# Patient Record
Sex: Male | Born: 1990 | Race: Black or African American | Hispanic: No | Marital: Single | Smoking: Current some day smoker
Health system: Southern US, Community
[De-identification: ages and names within clinical notes are randomized; demographics above are authoritative.]

## PROBLEM LIST (undated history)

## (undated) DIAGNOSIS — F319 Bipolar disorder, unspecified: Principal | ICD-10-CM

## (undated) DIAGNOSIS — F32A Depression, unspecified: Secondary | ICD-10-CM

## (undated) DIAGNOSIS — N433 Hydrocele, unspecified: Secondary | ICD-10-CM

## (undated) HISTORY — DX: Depression, unspecified: F32.A

## (undated) HISTORY — DX: Hydrocele, unspecified: N43.3

---
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
## 2011-03-19 ENCOUNTER — Ambulatory Visit: Payer: Self-pay | Admitting: Family Medicine

## 2011-12-21 ENCOUNTER — Telehealth: Payer: Self-pay | Admitting: Family Medicine

## 2011-12-21 ENCOUNTER — Ambulatory Visit (INDEPENDENT_AMBULATORY_CARE_PROVIDER_SITE_OTHER): Payer: Self-pay | Admitting: Family Medicine

## 2011-12-21 VITALS — BP 128/64 | HR 86 | Temp 100.2°F | Ht 71.0 in | Wt 163.5 lb

## 2011-12-21 DIAGNOSIS — J029 Acute pharyngitis, unspecified: Secondary | ICD-10-CM

## 2011-12-21 MED ORDER — AMOXICILLIN 500 MG PO CAPS: 500.0000 mg | ORAL_CAPSULE | Freq: Two times a day (BID) | ORAL | Status: DC

## 2011-12-21 NOTE — Patient Instructions (Addendum)
Take the Amoxicillin for the next 7 days. Let me know if you're not getting better later this week.

## 2011-12-22 DIAGNOSIS — J029 Acute pharyngitis, unspecified: Secondary | ICD-10-CM | POA: Insufficient documentation

## 2011-12-22 NOTE — Progress Notes (Signed)
  Subjective:    Patient ID: Jerry Johnson, male    DOB: [birth year], 21 y.o.   MRN: [id number]  HPI  1. Sore throat:  Present x 1 week.  Increasing pain.  Feels "glands are swollen."  Worse when swallows, eats, talks.  Subjective fevers but none measured.  No other URI symptoms, no cough.  Some chills at night.  No nausea or vomiting.  Continuing to eat and drink well.   Review of Systems No weight loss, chest pain, night sweats,.      Objective:   Physical Exam Gen:  Alert, cooperative patient who appears stated age in no acute distress.  Vital signs reviewed. Head:  [place]/AT Eyes:  EOMI, PERRL.  Sclera and conjunctiva non-erythematous and non-icteric. Nose:  Nares patent Ears:  Canals clear BL.  TM's pearly gray BL without effusion or retraction Mouth:  MMM.  Tonsils +3 BL with multiple exudates noted.  No thrush in mouth, none on posterior oropharynx.   Neck:  No lymphadenopathy noted Cardiac:  Regular rate and rhythm without murmur auscultated.  Good S1/S2. Pulm:  Clear to auscultation bilaterally with good air movement.  No wheezes or rales noted.           Assessment & Plan:

## 2011-12-22 NOTE — Assessment & Plan Note (Signed)
Strep test negative.  Plan to treat based on length of symptoms and fact he had noticeable exudates on examination.   No thrush, much less likely oral candidiasis.  If no improvement with Amoxicillin patient is to call or return in next 4-5 days, may need Nystatin and HIV test at that time.  No red flags however.

## 2011-12-23 NOTE — Telephone Encounter (Signed)
error 

## 2012-05-27 ENCOUNTER — Encounter: Payer: Self-pay | Admitting: Family Medicine

## 2012-05-27 ENCOUNTER — Ambulatory Visit (INDEPENDENT_AMBULATORY_CARE_PROVIDER_SITE_OTHER): Payer: Medicaid Other | Admitting: Family Medicine

## 2012-05-27 VITALS — BP 124/73 | HR 94 | Temp 98.1°F | Ht 69.0 in | Wt 162.5 lb

## 2012-05-27 DIAGNOSIS — R1031 Right lower quadrant pain: Secondary | ICD-10-CM

## 2012-05-27 HISTORY — DX: Right lower quadrant pain: R10.31

## 2012-05-27 NOTE — Assessment & Plan Note (Signed)
A: R groin pain that has completely resolved. Given his history of hernia he is at risk of recurrent hernia. Normal exam today. P: Reassurance Reviewed red flags to seek medical attention -see AVS F/u prn recurrent pain or new mass.

## 2012-05-27 NOTE — Progress Notes (Signed)
Subjective:     Patient ID: Jerry Johnson, male   DOB: 10/21/1990, 21 y.o.   MRN: [id number]  HPI 22 yo M with a hx significant for R inguinal hernia and hydrocele repair  with mesh placement 4 years ago (brief OP note in EPIC) presents for same day visit with R groin pain that started acutely last week. Pain with started acutely at work. Non-radiating. Sharp/achy. No testicular pain. No fever, nausea, vomiting, diarrhea or constipation.   He has no pain today.   Review of Systems As per HPI     Objective:   Physical Exam BP 124/73  Pulse 94  Temp(Src) 98.1 F (36.7 C) (Oral)  Ht 5\' 9"  (1.753 m)  Wt 162 lb 8 oz (73.71 kg)  BMI 23.99 kg/m2 General appearance: alert, cooperative and no distress Abdomen: soft, non-tender; bowel sounds normal; no masses,  no organomegaly, no umbilical hernia. R inguinal scar w/o tenderness or mass. No L inguinal mass. No testicular pain or mass.     Assessment and Plan:

## 2012-05-27 NOTE — Patient Instructions (Addendum)
Gared,  Thank you for coming in to see me today.  I suspect that your pain was associated with a small hernia or muscle strain.  Since your symptoms resolved on their own and your exam is normal today you do not need further intervention at this time. However, this pain can come back spontaneously.   The major concern with a hernia is that a small one can become incarcerated, stuck with a loop of bowel.  If you develop a painless bulge please come back for recheck and referral to the surgeon. If you develop a painful bulge please seek immediate medical attention, especially if it is associated with nausea, vomiting or fever.   Dr. Armen Pickup

## 2012-11-28 ENCOUNTER — Encounter (HOSPITAL_COMMUNITY): Payer: Self-pay | Admitting: Emergency Medicine

## 2012-11-28 DIAGNOSIS — N390 Urinary tract infection, site not specified: Secondary | ICD-10-CM | POA: Insufficient documentation

## 2012-11-28 DIAGNOSIS — Z87448 Personal history of other diseases of urinary system: Secondary | ICD-10-CM | POA: Insufficient documentation

## 2012-11-28 DIAGNOSIS — F17200 Nicotine dependence, unspecified, uncomplicated: Secondary | ICD-10-CM | POA: Insufficient documentation

## 2012-11-28 LAB — COMPREHENSIVE METABOLIC PANEL
ALT: 14 U/L (ref 0–53)
AST: 31 U/L (ref 0–37)
Albumin: 4.4 g/dL (ref 3.5–5.2)
Alkaline Phosphatase: 68 U/L (ref 39–117)
BUN: 11 mg/dL (ref 6–23)
CO2: 28 mEq/L (ref 19–32)
Calcium: 9.8 mg/dL (ref 8.4–10.5)
Chloride: 103 mEq/L (ref 96–112)
Creatinine, Ser: 0.74 mg/dL (ref 0.50–1.35)
GFR calc Af Amer: >90 mL/min (ref >90)
GFR calc non Af Amer: >90 mL/min (ref >90)
Glucose, Bld: 87 mg/dL (ref 70–99)
Potassium: 3.8 mEq/L (ref 3.5–5.1)
Sodium: 140 mEq/L (ref 135–145)
Total Bilirubin: 0.7 mg/dL (ref 0.3–1.2)
Total Protein: 7.8 g/dL (ref 6.0–8.3)

## 2012-11-28 LAB — CBC WITH DIFFERENTIAL/PLATELET
Basophils Absolute: 0 10*3/uL (ref 0.0–0.1)
Basophils Relative: 0 % (ref 0–1)
Eosinophils Absolute: 0.2 10*3/uL (ref 0.0–0.7)
Eosinophils Relative: 2 % (ref 0–5)
HCT: 37.7 % — ABNORMAL LOW (ref 39.0–52.0)
Hemoglobin: 12.6 g/dL — ABNORMAL LOW (ref 13.0–17.0)
Lymphocytes Relative: 19 % (ref 12–46)
Lymphs Abs: 1.8 10*3/uL (ref 0.7–4.0)
MCH: 25.3 pg — ABNORMAL LOW (ref 26.0–34.0)
MCHC: 33.4 g/dL (ref 30.0–36.0)
MCV: 75.6 fL — ABNORMAL LOW (ref 78.0–100.0)
Monocytes Absolute: 0.4 10*3/uL (ref 0.1–1.0)
Monocytes Relative: 5 % (ref 3–12)
Neutro Abs: 6.7 10*3/uL (ref 1.7–7.7)
Neutrophils Relative %: 74 % (ref 43–77)
Platelets: 176 10*3/uL (ref 150–400)
RBC: 4.99 MIL/uL (ref 4.22–5.81)
RDW: 14.8 % (ref 11.5–15.5)
WBC: 9.1 10*3/uL (ref 4.0–10.5)

## 2012-11-28 LAB — URINALYSIS, ROUTINE W REFLEX MICROSCOPIC
Glucose, UA: NEGATIVE mg/dL
Nitrite: NEGATIVE
pH: 7 (ref 5.0–8.0)

## 2012-11-28 LAB — URINE MICROSCOPIC-ADD ON

## 2012-11-28 NOTE — ED Notes (Signed)
PT. REPORTS EMESIS AND ABDOMINAL CRAMPING AFTER EATING DINNER LAST NIGHT .

## 2012-11-28 NOTE — ED Notes (Signed)
Pt provided urine specimen by clean catch. Pt was not in and out cath. Pt provided urine specimen with out any complaints.

## 2012-11-29 ENCOUNTER — Emergency Department (HOSPITAL_COMMUNITY)
Admission: EM | Admit: 2012-11-29 | Discharge: 2012-11-29 | Disposition: A | Discharge status: Home / Self Care | Payer: Medicaid Other | Attending: Emergency Medicine | Admitting: Emergency Medicine

## 2012-11-29 DIAGNOSIS — N390 Urinary tract infection, site not specified: Secondary | ICD-10-CM

## 2012-11-29 MED ORDER — GI COCKTAIL ~~LOC~~
30.0000 mL | Freq: Once | ORAL | Status: AC
  Administered 2012-11-29: 30 mL via ORAL
  Filled 2012-11-29: qty 30

## 2012-11-29 MED ORDER — CIPROFLOXACIN HCL 500 MG PO TABS: 500.0000 mg | ORAL_TABLET | Freq: Two times a day (BID) | ORAL | Status: DC

## 2012-11-29 MED ORDER — ONDANSETRON 4 MG PO TBDP
8.0000 mg | ORAL_TABLET | Freq: Once | ORAL | Status: AC
  Administered 2012-11-29: 8 mg via ORAL
  Filled 2012-11-29: qty 2

## 2012-11-29 NOTE — ED Provider Notes (Signed)
CSN: [id number]     Arrival date & time 11/28/12  2130 History   First MD Initiated Contact with Patient 11/29/12 0118     Chief Complaint  Patient presents with  . Emesis   (Consider location/radiation/quality/duration/timing/severity/associated sxs/prior Treatment) Patient is a 22 y.o. male presenting with vomiting. The history is provided by the patient.  Emesis Severity:  Mild Timing:  Constant Number of daily episodes:  2 Quality:  Stomach contents Progression:  Unchanged Chronicity:  New Recent urination:  Normal Relieved by:  Nothing Worsened by:  Nothing tried Ineffective treatments:  None tried Associated symptoms: no abdominal pain and no fever   Associated symptoms comment:  Denies frequency dysuria denies penile discharge Risk factors: suspect food intake     Past Medical History  Diagnosis Date  . UNSPECIFIED HYDROCELE 03/12/2009    Qualifier: Diagnosis of  By: Burnadette Pop  MD, Trisha Mangle     Past Surgical History  Procedure Laterality Date  . Inguinal hernia repair  03/21/09    Repair of R inguinal hernia with hydrocele   No family history on file. History  Substance Use Topics  . Smoking status: Current Every Day Smoker -- 0.50 packs/day    Types: Cigarettes  . Smokeless tobacco: Not on file  . Alcohol Use: Not on file    Review of Systems  Gastrointestinal: Positive for vomiting. Negative for abdominal pain.  All other systems reviewed and are negative.    Allergies  Review of patient's allergies indicates no known allergies.  Home Medications   Current Outpatient Rx  Name  Route  Sig  Dispense  Refill  . cetirizine (ZYRTEC HIVES RELIEF) 10 MG tablet   Oral   Take 10 mg by mouth daily as needed for allergies or rhinitis.           BP 148/82  Pulse 58  Temp(Src) 98.7 F (37.1 C) (Oral)  Resp 16  SpO2 100% Physical Exam  Constitutional: He is oriented to person, place, and time. He appears well-developed and well-nourished. No distress.   HENT:  Head: Normocephalic and atraumatic.  Mouth/Throat: Oropharynx is clear and moist.  Eyes: Conjunctivae are normal. Pupils are equal, round, and reactive to light.  Neck: Normal range of motion. Neck supple.  Cardiovascular: Normal rate, regular rhythm and intact distal pulses.   Pulmonary/Chest: Effort normal and breath sounds normal. He has no wheezes. He has no rales.  Abdominal: Soft. Bowel sounds are normal. He exhibits no distension and no mass. There is no tenderness. There is no rebound and no guarding.  Musculoskeletal: Normal range of motion.  Neurological: He is alert and oriented to person, place, and time.  Skin: Skin is warm and dry.  Psychiatric: He has a normal mood and affect.    ED Course  Procedures (including critical care time) Labs Review Labs Reviewed  CBC WITH DIFFERENTIAL - Abnormal; Notable for the following:    Hemoglobin 12.6 (*)    HCT 37.7 (*)    MCV 75.6 (*)    MCH 25.3 (*)    All other components within normal limits  URINALYSIS, ROUTINE W REFLEX MICROSCOPIC - Abnormal; Notable for the following:    APPearance CLOUDY (*)    Leukocytes, UA MODERATE (*)    All other components within normal limits  COMPREHENSIVE METABOLIC PANEL  URINE MICROSCOPIC-ADD ON   Imaging Review No results found.  MDM  No diagnosis found. Will trat for UTI   Hansford Hirt K Waris Rodger-Rasch, MD 11/29/12 [phone]

## 2013-06-07 ENCOUNTER — Encounter: Payer: Self-pay | Admitting: Sports Medicine

## 2013-06-07 ENCOUNTER — Ambulatory Visit (INDEPENDENT_AMBULATORY_CARE_PROVIDER_SITE_OTHER): Payer: Medicaid Other | Admitting: Sports Medicine

## 2013-06-07 VITALS — BP 146/75 | HR 80 | Temp 100.0°F | Wt 164.0 lb

## 2013-06-07 DIAGNOSIS — J029 Acute pharyngitis, unspecified: Secondary | ICD-10-CM

## 2013-06-07 DIAGNOSIS — J309 Allergic rhinitis, unspecified: Secondary | ICD-10-CM

## 2013-06-07 HISTORY — DX: Acute pharyngitis, unspecified: J02.9

## 2013-06-07 LAB — POCT RAPID STREP A (OFFICE): Rapid Strep A Screen: NEGATIVE

## 2013-06-07 MED ORDER — FLUTICASONE PROPIONATE 50 MCG/ACT NA SUSP: 2.0000 | Freq: Every day | NASAL | Status: DC

## 2013-06-07 NOTE — Patient Instructions (Addendum)
Start flonase daily for allergies  You have cold; typical cold symptoms can last for up to 10 days but should be continually improving after the 5th day.  If worsening after the 7th day please call us back. Please continue to drink plenty of fluids Take acetaminophen (Tylenol) 1X500mg  or 2X325mg  tablets every 8 hours  Take Ibuprofen (Advil or Motrin) 2-3 200mg  tablets every 8 hours  Try to alternate the acetaminophen and ibuprofen so that you take one every 4 hours Honey has been shown to help with cough.  You can try mixing  a teaspoon of honey in warm water or caffeine free herbal tea before bedtime.   Upper Respiratory Infection, Adult An upper respiratory infection (URI) is also sometimes known as the common cold. The upper respiratory tract includes the nose, sinuses, throat, trachea, and bronchi. Bronchi are the airways leading to the lungs. Most people improve within 1 week, but symptoms can last up to 2 weeks. A residual cough may last even longer.  CAUSES Many different viruses can infect the tissues lining the upper respiratory tract. The tissues become irritated and inflamed and often become very moist. Mucus production is also common. A cold is contagious. You can easily spread the virus to others by oral contact. This includes kissing, sharing a glass, coughing, or sneezing. Touching your mouth or nose and then touching a surface, which is then touched by another person, can also spread the virus. SYMPTOMS  Symptoms typically develop 1 to 3 days after you come in contact with a cold virus. Symptoms vary from person to person. They may include:  Runny nose.  Sneezing.  Nasal congestion.  Sinus irritation.  Sore throat.  Loss of voice (laryngitis).  Cough.  Fatigue.  Muscle aches.  Loss of appetite.  Headache.  Low-grade fever. DIAGNOSIS  You might diagnose your own cold based on familiar symptoms, since most people get a cold 2 to 3 times a year. Your caregiver  can confirm this based on your exam. Most importantly, your caregiver can check that your symptoms are not due to another disease such as strep throat, sinusitis, pneumonia, asthma, or epiglottitis. Blood tests, throat tests, and X-rays are not necessary to diagnose a common cold, but they may sometimes be helpful in excluding other more serious diseases. Your caregiver will decide if any further tests are required. RISKS AND COMPLICATIONS  You may be at risk for a more severe case of the common cold if you smoke cigarettes, have chronic heart disease (such as heart failure) or lung disease (such as asthma), or if you have a weakened immune system. The very young and very old are also at risk for more serious infections. Bacterial sinusitis, middle ear infections, and bacterial pneumonia can complicate the common cold. The common cold can worsen asthma and chronic obstructive pulmonary disease (COPD). Sometimes, these complications can require emergency medical care and may be life-threatening. PREVENTION  The best way to protect against getting a cold is to practice good hygiene. Avoid oral or hand contact with people with cold symptoms. Wash your hands often if contact occurs. There is no clear evidence that vitamin C, vitamin E, echinacea, or exercise reduces the chance of developing a cold. However, it is always recommended to get plenty of rest and practice good nutrition. TREATMENT  Treatment is directed at relieving symptoms. There is no cure. Antibiotics are not effective, because the infection is caused by a virus, not by bacteria. Treatment may include:  Increased fluid intake.  Sports drinks offer valuable electrolytes, sugars, and fluids.  Breathing heated mist or steam (vaporizer or shower).  Eating chicken soup or other clear broths, and maintaining good nutrition.  Getting plenty of rest.  Using gargles or lozenges for comfort.  Controlling fevers with ibuprofen or acetaminophen as  directed by your caregiver.  Increasing usage of your inhaler if you have asthma. Zinc gel and zinc lozenges, taken in the first 24 hours of the common cold, can shorten the duration and lessen the severity of symptoms. Pain medicines may help with fever, muscle aches, and throat pain. A variety of non-prescription medicines are available to treat congestion and runny nose. Your caregiver can make recommendations and may suggest nasal or lung inhalers for other symptoms.  HOME CARE INSTRUCTIONS   Only take over-the-counter or prescription medicines for pain, discomfort, or fever as directed by your caregiver.  Use a warm mist humidifier or inhale steam from a shower to increase air moisture. This may keep secretions moist and make it easier to breathe.  Drink enough water and fluids to keep your urine clear or pale yellow.  Rest as needed.  Return to work when your temperature has returned to normal or as your caregiver advises. You may need to stay home longer to avoid infecting others. You can also use a face mask and careful hand washing to prevent spread of the virus. SEEK MEDICAL CARE IF:   After the first few days, you feel you are getting worse rather than better.  You need your caregiver's advice about medicines to control symptoms.  You develop chills, worsening shortness of breath, or brown or red sputum. These may be signs of pneumonia.  You develop yellow or brown nasal discharge or pain in the face, especially when you bend forward. These may be signs of sinusitis.  You develop a fever, swollen neck glands, pain with swallowing, or white areas in the back of your throat. These may be signs of strep throat. SEEK IMMEDIATE MEDICAL CARE IF:   You have a fever.  You develop severe or persistent headache, ear pain, sinus pain, or chest pain.  You develop wheezing, a prolonged cough, cough up blood, or have a change in your usual mucus (if you have chronic lung disease).  You  develop sore muscles or a stiff neck. Document Released: 09/16/2000 Document Revised: 06/15/2011 Document Reviewed: 07/25/2010 [facility] Patient Information 2014 [place], [place].

## 2013-06-07 NOTE — Assessment & Plan Note (Signed)
Given long-standing issues of allergic rhinitis and fluid behind the ears today will treat with nasal steroid.

## 2013-06-07 NOTE — Assessment & Plan Note (Addendum)
Problem Based Documentation:    Subjective Report:  Started yesterday  Reports fevers, significant throat pain, nausea but no vomiting.  Multiple sick contacts at home.  Improved with Tylenol.  Taking TheraFlu sinus. Not taking allergy medications but chronic ear issues.     Assessment & Plan & Follow up Issues:  Acute condition 1.   Rapid strep test today = negative; strep culture sent 2. Supportive treatment for acute viral pharyngitis 3. Restart Flonase for likely allergic component and for serous effusion > Followup chronic allergies

## 2013-06-07 NOTE — Progress Notes (Signed)
  Jerry Johnson - 23 y.o. male MRN [id number]  Date of birth: 03/21/1991  Chief Complaint  Patient presents with  . Otalgia  . Fever   General Plan & Pt Instructions:    Sx treatment  Start Flonase for allergies      SUBJECTIVE:   He reports fevers to 101.8oF starting yesterday  No flu shot  [employer] Student For further subjective including (HPI, Interval History & ROS) please see problem based charting  HISTORY: History  Smoking status  . Current Every Day Smoker -- 0.50 packs/day  . Types: Cigarettes  Smokeless tobacco  . Not on file   Health Maintenance Due  Topic  . Tetanus/tdap   . Influenza Vaccine     Otherwise past Medical, Surgical, Social, and Family History Reviewed per EMR Medications and Allergies reviewed and updated per below.  VITALS: BP 146/75  Pulse 80  Temp(Src) 100 F (37.8 C) (Oral)  Wt 164 lb (74.39 kg)  PHYSICAL EXAM: GENERAL: Adult African American  male. In no discomfort; no respiratory distress  PSYCH: alert and appropriate, good insight   HNEENT:  bilateral middle ear serous effusions without erythema  Bilateral nasal congestion  3+ out of 4 tonsillar hypertrophy with minimal exudate.  Posterior oropharyngeal erythema  Anterior cervical lymphadenopathy   CARDIO: RRR, S1/S2 heard, no murmur  LUNGS: CTA B, no wheezes, no crackles  ABDOMEN: +BS, soft, non-tender, no rigidity, no guarding, no masses/hepatosplenomegaly  EXTREM:  Warm, well perfused.  Moves all 4 extremities spontaneously; no lateralization.   No pretibial edema.  GU:   SKIN:  no rash noted     MEDICATIONS, LABS & OTHER ORDERS: Previous Medications   CETIRIZINE (ZYRTEC HIVES RELIEF) 10 MG TABLET    Take 10 mg by mouth daily as needed for allergies or rhinitis.    CIPROFLOXACIN (CIPRO) 500 MG TABLET    Take 1 tablet (500 mg total) by mouth 2 (two) times daily. One po bid x 7 days   Modified Medications   No medications on file   New Prescriptions   FLUTICASONE  (FLONASE) 50 MCG/ACT NASAL SPRAY    Place 2 sprays into both nostrils daily.   Discontinued Medications   No medications on file   Orders Placed This Encounter  Procedures  . Strep A DNA probe  . POCT rapid strep A    ASSESSMENT & PLAN:  See problem based charting & AVS for pt instructions.

## 2013-06-08 LAB — STREP A DNA PROBE: GASP: NEGATIVE

## 2013-11-22 ENCOUNTER — Encounter: Payer: Medicaid Other | Admitting: Family Medicine

## 2014-01-09 ENCOUNTER — Encounter: Payer: Self-pay | Admitting: Family Medicine

## 2014-01-09 ENCOUNTER — Ambulatory Visit (INDEPENDENT_AMBULATORY_CARE_PROVIDER_SITE_OTHER): Payer: Self-pay | Admitting: Family Medicine

## 2014-01-09 VITALS — BP 148/85 | HR 69 | Temp 98.3°F | Wt 164.0 lb

## 2014-01-09 DIAGNOSIS — H6093 Unspecified otitis externa, bilateral: Secondary | ICD-10-CM

## 2014-01-09 DIAGNOSIS — R21 Rash and other nonspecific skin eruption: Secondary | ICD-10-CM

## 2014-01-09 MED ORDER — OFLOXACIN 0.3 % OT SOLN: 10.0000 [drp] | Freq: Every day | OTIC | Status: DC

## 2014-01-09 MED ORDER — TRIAMCINOLONE ACETONIDE 0.5 % EX OINT: 1.0000 "application " | TOPICAL_OINTMENT | Freq: Two times a day (BID) | CUTANEOUS | Status: DC

## 2014-01-09 NOTE — Patient Instructions (Signed)
Nice to see you. Please use the ear drops daily for 7 days. Use the cream for 7 days as well.  If you develop fever, spreading rash, worsening ear discomfort, or drainage please let us know.

## 2014-01-10 ENCOUNTER — Telehealth: Payer: Self-pay | Admitting: *Deleted

## 2014-01-10 DIAGNOSIS — H6093 Unspecified otitis externa, bilateral: Secondary | ICD-10-CM

## 2014-01-10 DIAGNOSIS — R21 Rash and other nonspecific skin eruption: Secondary | ICD-10-CM

## 2014-01-10 HISTORY — DX: Rash and other nonspecific skin eruption: R21

## 2014-01-10 HISTORY — DX: Unspecified otitis externa, bilateral: H60.93

## 2014-01-10 NOTE — Assessment & Plan Note (Addendum)
Patient with apparent mild otitis externa of bilateral ears. Potentially could be related to trauma to the ear canal from over aggressive cleaning of the area with q-tips. Advised on not using q-tips to clean ear canal. Given prescription for ofloxacin ear drops. Given return precautions. F/u prn.

## 2014-01-10 NOTE — Assessment & Plan Note (Signed)
Likely an allergic dermatitis relating to poison oak exposure. Will treat with topical steroid at this time. If not improving he is to return to care. Given return precautions. Note to keep out of work through Thursday.

## 2014-01-10 NOTE — Progress Notes (Signed)
Patient ID: Jerry Johnson, male   DOB: 06/28/1990, 23 y.o.   MRN: [id number]  Marikay AlarEric Sonnenberg, MD Phone: [phone]  Jerry Johnson is a 23 y.o. male who presents today for same day.  Rash: present since Saturday. Got in to poison oak per his father. Started on right forearm and has gotten similar rash on left forearm on Sunday. There is itching. There is no surrounding redness. No fluid leakage from these. Has not improved much or worsened. Has not used any medications for this. Works at [employer] and was advised by his boss that he could not return to work until rash improved.   Ear bleed: patient notes recently has had irritation and bleeding from both his ears. There has been itching. The bleeding occurs only after cleaning the ears with q-tips. Saw ENT for similar issue a long time ago and they gave him drops. It is painful when he cleans his ears.   Patient is a smoker.   ROS: Per HPI   Physical Exam Filed Vitals:   01/09/14 1126  BP: 148/85  Pulse: 69  Temp: 98.3 F (36.8 C)    Gen: Well NAD HEENT: PERRL,  MMM, bilateral TMs normal, bilateral ear canal with mild erythema and irritation, there are excoriations present bilaterally with minimal superficial blood, mild discomfort with pulling the ear Lungs: CTABL Nl WOB Heart: RRR no MRG Skin: right volar medial forearm with patch of erythematous papules with linear distribution, excoriations present, scattered small erythematous papules in linear distribution on left fore arm   Assessment/Plan: Please see individual problem list.   Marikay AlarEric Sonnenberg, MD [facility] PGY[facility]

## 2014-01-10 NOTE — Telephone Encounter (Signed)
[facility] and states that pt only has medicaid family planning.  Triamcinolone .5% is not covered but the 0.1% is around $4-5.  Ok'd for this to be changed due to insurance coverage.  Will forward to MD to make him aware. Jazmin Hartsell,CMA

## 2014-09-18 ENCOUNTER — Encounter (HOSPITAL_COMMUNITY): Payer: Self-pay | Admitting: Emergency Medicine

## 2014-09-18 ENCOUNTER — Emergency Department (HOSPITAL_COMMUNITY)
Admission: EM | Admit: 2014-09-18 | Discharge: 2014-09-18 | Disposition: A | Discharge status: Home / Self Care | Payer: Medicaid Other | Attending: Emergency Medicine | Admitting: Emergency Medicine

## 2014-09-18 DIAGNOSIS — R1110 Vomiting, unspecified: Secondary | ICD-10-CM | POA: Insufficient documentation

## 2014-09-18 DIAGNOSIS — Z79899 Other long term (current) drug therapy: Secondary | ICD-10-CM | POA: Insufficient documentation

## 2014-09-18 DIAGNOSIS — Z720 Tobacco use: Secondary | ICD-10-CM | POA: Insufficient documentation

## 2014-09-18 DIAGNOSIS — Z87448 Personal history of other diseases of urinary system: Secondary | ICD-10-CM | POA: Insufficient documentation

## 2014-09-18 DIAGNOSIS — H6692 Otitis media, unspecified, left ear: Secondary | ICD-10-CM | POA: Insufficient documentation

## 2014-09-18 LAB — CBG MONITORING, ED: GLUCOSE-CAPILLARY: 102 mg/dL — AB (ref 65–99)

## 2014-09-18 MED ORDER — CIPROFLOXACIN-HYDROCORTISONE 0.2-1 % OT SUSP: 3.0000 [drp] | Freq: Two times a day (BID) | OTIC | Status: DC

## 2014-09-18 MED ORDER — ONDANSETRON 4 MG PO TBDP: ORAL_TABLET | ORAL | Status: DC

## 2014-09-18 MED ORDER — SODIUM CHLORIDE 0.9 % IV BOLUS (SEPSIS)
1000.0000 mL | Freq: Once | INTRAVENOUS | Status: AC
  Administered 2014-09-18: 1000 mL via INTRAVENOUS

## 2014-09-18 MED ORDER — ONDANSETRON HCL 4 MG/2ML IJ SOLN
4.0000 mg | Freq: Once | INTRAMUSCULAR | Status: AC
  Administered 2014-09-18: 4 mg via INTRAVENOUS
  Filled 2014-09-18: qty 2

## 2014-09-18 NOTE — ED Notes (Signed)
Notified MD pt ready for re-eval at this time

## 2014-09-18 NOTE — ED Notes (Signed)
Pt c/o abd(started this morning) and back pain x 2 weeks and bilateral ear pain(since childhood). Pt states woke up this morning and vomited.

## 2014-09-18 NOTE — ED Provider Notes (Signed)
CSN: [id number]     Arrival date & time 09/18/14  4098 History   First MD Initiated Contact with Patient 09/18/14 0930     Chief Complaint  Patient presents with  . Abdominal Pain  . Otalgia     (Consider location/radiation/quality/duration/timing/severity/associated sxs/prior Treatment) Patient is a 24 y.o. male presenting with vomiting. The history is provided by the patient (pt complains of vomiting and ear ache).  Emesis Severity:  Moderate Timing:  Intermittent Quality:  Undigested food Able to tolerate:  Liquids Progression:  Unchanged Associated symptoms: no abdominal pain, no diarrhea and no headaches     Past Medical History  Diagnosis Date  . UNSPECIFIED HYDROCELE 03/12/2009    Qualifier: Diagnosis of  By: Burnadette Pop  MD, Trisha Mangle     Past Surgical History  Procedure Laterality Date  . Inguinal hernia repair  03/21/09    Repair of R inguinal hernia with hydrocele   History reviewed. No pertinent family history. History  Substance Use Topics  . Smoking status: Current Every Day Smoker -- 0.50 packs/day    Types: Cigarettes  . Smokeless tobacco: Not on file  . Alcohol Use: Not on file    Review of Systems  Constitutional: Negative for appetite change and fatigue.  HENT: Negative for congestion, ear discharge and sinus pressure.        Left ear pain  Eyes: Negative for discharge.  Respiratory: Negative for cough.   Cardiovascular: Negative for chest pain.  Gastrointestinal: Positive for vomiting. Negative for abdominal pain and diarrhea.  Genitourinary: Negative for frequency and hematuria.  Musculoskeletal: Negative for back pain.  Skin: Negative for rash.  Neurological: Negative for seizures and headaches.  Psychiatric/Behavioral: Negative for hallucinations.      Allergies  Review of patient's allergies indicates no known allergies.  Home Medications   Prior to Admission medications   Medication Sig Start Date End Date Taking? Authorizing Provider   ciprofloxacin (CIPRO) 500 MG tablet Take 1 tablet (500 mg total) by mouth 2 (two) times daily. One po bid x 7 days Patient not taking: Reported on 09/18/2014 11/29/12   April Palumbo, MD  ciprofloxacin-hydrocortisone (CIPRO [facility]) otic suspension Place 3 drops into the left ear 2 (two) times daily. 09/18/14   Bethann Berkshire, MD  fluticasone (FLONASE) 50 MCG/ACT nasal spray Place 2 sprays into both nostrils daily. Patient not taking: Reported on 09/18/2014 06/07/13   Andrena Mews, DO  ofloxacin (FLOXIN) 0.3 % otic solution Place 10 drops into both ears daily. For 7 days. Patient not taking: Reported on 09/18/2014 01/09/14   Glori Luis, MD  ondansetron (ZOFRAN ODT) 4 MG disintegrating tablet  ODT q4 hours prn nausea/vomit 09/18/14   Bethann Berkshire, MD  triamcinolone ointment (KENALOG) 0.5 % Apply 1 application topically 2 (two) times daily. For 7 days. Patient not taking: Reported on 09/18/2014 01/09/14   Glori Luis, MD   BP 120/78 mmHg  Pulse 66  Temp(Src) 98.9 F (37.2 C) (Oral)  Resp 18  SpO2 100% Physical Exam  Constitutional: He is oriented to person, place, and time. He appears well-developed.  HENT:  Head: Normocephalic.  Left otitis externa  Eyes: Conjunctivae and EOM are normal. No scleral icterus.  Neck: Neck supple. No thyromegaly present.  Cardiovascular: Normal rate and regular rhythm.  Exam reveals no gallop and no friction rub.   No murmur heard. Pulmonary/Chest: No stridor. He has no wheezes. He has no rales. He exhibits no tenderness.  Abdominal: He exhibits no distension. There is  no tenderness. There is no rebound.  Musculoskeletal: Normal range of motion. He exhibits no edema.  Lymphadenopathy:    He has no cervical adenopathy.  Neurological: He is oriented to person, place, and time. He exhibits normal muscle tone. Coordination normal.  Skin: No rash noted. No erythema.  Psychiatric: He has a normal mood and affect. His behavior is normal.    ED Course   Procedures (including critical care time) Labs Review Labs Reviewed  CBG MONITORING, ED - Abnormal; Notable for the following:    Glucose-Capillary 102 (*)    All other components within normal limits    Imaging Review No results found.   EKG Interpretation None      MDM   Final diagnoses:  Otitis media follow-up, not resolved, left    Otitis externa and vomiting,  tx with cipro otic and zofran with follow up    Bethann Berkshire, MD 09/18/14 1141

## 2014-09-18 NOTE — Discharge Instructions (Signed)
Drink plenty of fluids.  Follow up if you do not improve

## 2015-02-12 ENCOUNTER — Emergency Department (HOSPITAL_COMMUNITY)
Admission: EM | Admit: 2015-02-12 | Discharge: 2015-02-12 | Disposition: A | Discharge status: Home / Self Care | Payer: Medicaid Other | Attending: Emergency Medicine | Admitting: Emergency Medicine

## 2015-02-12 DIAGNOSIS — R319 Hematuria, unspecified: Secondary | ICD-10-CM

## 2015-02-12 DIAGNOSIS — Z720 Tobacco use: Secondary | ICD-10-CM | POA: Insufficient documentation

## 2015-02-12 DIAGNOSIS — Z87438 Personal history of other diseases of male genital organs: Secondary | ICD-10-CM | POA: Insufficient documentation

## 2015-02-12 LAB — URINALYSIS, ROUTINE W REFLEX MICROSCOPIC
Bilirubin Urine: NEGATIVE
GLUCOSE, UA: NEGATIVE mg/dL
Ketones, ur: NEGATIVE mg/dL
Leukocytes, UA: NEGATIVE
Nitrite: NEGATIVE
PROTEIN: NEGATIVE mg/dL
SPECIFIC GRAVITY, URINE: 1.022 (ref 1.005–1.030)
Urobilinogen, UA: 0.2 mg/dL (ref 0.0–1.0)
pH: 6 (ref 5.0–8.0)

## 2015-02-12 LAB — URINE MICROSCOPIC-ADD ON

## 2015-02-12 NOTE — Discharge Instructions (Signed)

## 2015-02-12 NOTE — ED Notes (Signed)
Per EMS: Pt's girlfriend states they were "doing the nasty" and she went to the bathroom and saw blood but "wasn't on her period".  States that she squeezed his penis and blood came out.

## 2015-02-13 LAB — GC/CHLAMYDIA PROBE AMP (~~LOC~~) NOT AT ARMC
Chlamydia: POSITIVE — AB
Neisseria Gonorrhea: NEGATIVE

## 2015-02-13 NOTE — ED Provider Notes (Signed)
CSN: [id number]     Arrival date & time 02/12/15  1558 History   First MD Initiated Contact with Patient 02/12/15 1802     Chief Complaint  Patient presents with  . Hematuria     (Consider location/radiation/quality/duration/timing/severity/associated sxs/prior Treatment) HPI Comments: 24yo M who p/w hematuria. Pt states that he and his girlfriend had vaginal intercourse and then he went to the bathroom to urinate. He saw blood at the urethral meatus and when his girlfriend squeezed his penis a few more drops of blood came out. He called EMS who brought him here for evaluation. He endorses pain when the blood came out but no pain currently. No dysuria or abdominal pain. No scrotal swelling or penile lesions, no penile discharge recently. He denies any h/o hematuria.   Patient is a 24 y.o. male presenting with hematuria. The history is provided by the patient.  Hematuria    Past Medical History  Diagnosis Date  . UNSPECIFIED HYDROCELE 03/12/2009    Qualifier: Diagnosis of  By: Burnadette Pop  MD, Trisha Mangle     Past Surgical History  Procedure Laterality Date  . Inguinal hernia repair  03/21/09    Repair of R inguinal hernia with hydrocele   No family history on file. Social History  Substance Use Topics  . Smoking status: Current Every Day Smoker -- 0.50 packs/day    Types: Cigarettes  . Smokeless tobacco: Not on file  . Alcohol Use: Not on file    Review of Systems  Genitourinary: Positive for hematuria.    10 Systems reviewed and are negative for acute change except as noted in the HPI.   Allergies  Review of patient's allergies indicates no known allergies.  Home Medications   Prior to Admission medications   Not on File   BP 145/85 mmHg  Pulse 75  Temp(Src) 98.4 F (36.9 C) (Oral)  Resp 18  SpO2 100% Physical Exam  Constitutional: He is oriented to person, place, and time. He appears well-developed and well-nourished. No distress.  HENT:  Head: Normocephalic and  atraumatic.  Moist mucous membranes  Eyes: Conjunctivae are normal. Pupils are equal, round, and reactive to light.  Neck: Neck supple.  Cardiovascular: Normal rate, regular rhythm and normal heart sounds.   No murmur heard. Pulmonary/Chest: Effort normal and breath sounds normal.  Abdominal: Soft. Bowel sounds are normal. He exhibits no distension. There is no tenderness.  Genitourinary: Penis normal. No penile tenderness.  Bilateral descended testes, no tenderness or swelling; no penile discharge or bleeding  Musculoskeletal: He exhibits no edema.  Neurological: He is alert and oriented to person, place, and time.  Fluent speech  Skin: Skin is warm and dry. No rash noted.  Psychiatric: He has a normal mood and affect. Judgment normal.  Nursing note and vitals reviewed. Chaperone was present during exam.   ED Course  Procedures (including critical care time) Labs Review Labs Reviewed  URINALYSIS, ROUTINE W REFLEX MICROSCOPIC (NOT AT [facility]) - Abnormal; Notable for the following:    APPearance CLOUDY (*)    Hgb urine dipstick MODERATE (*)    All other components within normal limits  URINE MICROSCOPIC-ADD ON  GC/CHLAMYDIA PROBE AMP ([place]) NOT AT [facility]    Imaging Review No results found. I have personally reviewed and evaluated these  lab results as part of my medical decision-making.   EKG Interpretation None      MDM   Final diagnoses:  Hematuria   23yo M w/ hematuria after intercourse today.  No rash, bleeding, swelling, or tenderness of exam of penis or scrotum. Pt denies pain. UA shows hematuria without infection. GC/chlamydia sent. DDx includes urethral irritation from intercourse, blood from partner's vagina/cervix, STD, or blood originating from bladder. Instructed to f/u w/ PCP in 1 week for repeat UA and if positive referral to urology. Gave urology f/u info and return precautions reviewed. Pt voiced understanding.    Laurence Spatesachel Morgan Kendell Gammon, MD 02/13/15  [phone]

## 2015-02-14 ENCOUNTER — Telehealth (HOSPITAL_COMMUNITY): Payer: Self-pay

## 2015-02-14 NOTE — Telephone Encounter (Signed)
Positive for chlamydia. Chart sent to EDP office for review.  

## 2015-02-17 ENCOUNTER — Telehealth (HOSPITAL_BASED_OUTPATIENT_CLINIC_OR_DEPARTMENT_OTHER): Payer: Self-pay | Admitting: Emergency Medicine

## 2015-02-17 NOTE — Telephone Encounter (Signed)
Post ED Visit - Positive Culture Follow-up: Successful Patient Follow-Up  Culture assessed and recommendations reviewed by: []  Enzo BiNathan Batchelder, Pharm.D. []  Celedonio MiyamotoJeremy Frens, Pharm.D., BCPS []  Garvin FilaMike Maccia, Pharm.D. []  Georgina PillionElizabeth Martin, Pharm.D., BCPS []  PosenMinh Pham, [address]Pharm[address]D., BCPS, AAHIVP []  Estella HuskMichelle Turner, Pharm.D., BCPS, AAHIVP []  Tennis Mustassie Stewart, [address]Pharm[address]D. []  Sherle Poeob Vincent, [address]Ph[address]  Positive chlamydia culture   [x]  Patient discharged without antimicrobial prescription and treatment is now indicated []  Organism is resistant to prescribed ED discharge antimicrobial []  Patient with positive blood cultures  Changes discussed with ED provider: Mirian MoMatthew Gentry MD New antibiotic prescription Azithromycin 1 gram po x 1 Called to [facility] [address]  Contacted patient, 02/17/15 1300   Berle MullMiller, Lauris Keepers 02/17/2015, 12:59 PM

## 2016-10-29 ENCOUNTER — Encounter (HOSPITAL_COMMUNITY): Payer: Self-pay

## 2016-10-29 ENCOUNTER — Inpatient Hospital Stay (HOSPITAL_COMMUNITY)
Admission: AD | Admit: 2016-10-29 | Payer: Federal, State, Local not specified - Other | Source: Intra-hospital | Admitting: Psychiatry

## 2016-10-29 ENCOUNTER — Emergency Department (HOSPITAL_COMMUNITY)
Admission: EM | Admit: 2016-10-29 | Discharge: 2016-10-29 | Disposition: A | Discharge status: Other Acute Inpatient Hospital | Payer: Medicaid Other | Attending: Emergency Medicine | Admitting: Emergency Medicine

## 2016-10-29 ENCOUNTER — Inpatient Hospital Stay (HOSPITAL_COMMUNITY)
Admission: AD | Admit: 2016-10-29 | Discharge: 2016-10-30 | DRG: 882 | Disposition: A | Discharge status: Home / Self Care | Payer: Federal, State, Local not specified - Other | Attending: Psychiatry | Admitting: Psychiatry

## 2016-10-29 DIAGNOSIS — F4323 Adjustment disorder with mixed anxiety and depressed mood: Secondary | ICD-10-CM | POA: Diagnosis not present

## 2016-10-29 DIAGNOSIS — Z818 Family history of other mental and behavioral disorders: Secondary | ICD-10-CM

## 2016-10-29 DIAGNOSIS — Z915 Personal history of self-harm: Secondary | ICD-10-CM | POA: Diagnosis not present

## 2016-10-29 DIAGNOSIS — F1290 Cannabis use, unspecified, uncomplicated: Secondary | ICD-10-CM | POA: Diagnosis not present

## 2016-10-29 DIAGNOSIS — F909 Attention-deficit hyperactivity disorder, unspecified type: Secondary | ICD-10-CM | POA: Diagnosis present

## 2016-10-29 DIAGNOSIS — F17210 Nicotine dependence, cigarettes, uncomplicated: Secondary | ICD-10-CM | POA: Diagnosis present

## 2016-10-29 DIAGNOSIS — G4700 Insomnia, unspecified: Secondary | ICD-10-CM | POA: Diagnosis not present

## 2016-10-29 DIAGNOSIS — F332 Major depressive disorder, recurrent severe without psychotic features: Secondary | ICD-10-CM | POA: Diagnosis present

## 2016-10-29 DIAGNOSIS — R45851 Suicidal ideations: Secondary | ICD-10-CM | POA: Insufficient documentation

## 2016-10-29 DIAGNOSIS — F329 Major depressive disorder, single episode, unspecified: Secondary | ICD-10-CM | POA: Diagnosis present

## 2016-10-29 DIAGNOSIS — F322 Major depressive disorder, single episode, severe without psychotic features: Secondary | ICD-10-CM | POA: Insufficient documentation

## 2016-10-29 LAB — COMPREHENSIVE METABOLIC PANEL
ALT: 16 U/L — AB (ref 17–63)
ANION GAP: 11 (ref 5–15)
AST: 25 U/L (ref 15–41)
Albumin: 4.7 g/dL (ref 3.5–5.0)
Alkaline Phosphatase: 65 U/L (ref 38–126)
BUN: 12 mg/dL (ref 6–20)
CALCIUM: 9.7 mg/dL (ref 8.9–10.3)
CHLORIDE: 104 mmol/L (ref 101–111)
CO2: 24 mmol/L (ref 22–32)
CREATININE: 0.83 mg/dL (ref 0.61–1.24)
Glucose, Bld: 100 mg/dL — ABNORMAL HIGH (ref 65–99)
Potassium: 3.6 mmol/L (ref 3.5–5.1)
SODIUM: 139 mmol/L (ref 135–145)
Total Bilirubin: 1.3 mg/dL — ABNORMAL HIGH (ref 0.3–1.2)
Total Protein: 8.3 g/dL — ABNORMAL HIGH (ref 6.5–8.1)

## 2016-10-29 LAB — RAPID URINE DRUG SCREEN, HOSP PERFORMED
Amphetamines: NOT DETECTED
Barbiturates: NOT DETECTED
Benzodiazepines: NOT DETECTED
COCAINE: NOT DETECTED
OPIATES: NOT DETECTED
Tetrahydrocannabinol: POSITIVE — AB

## 2016-10-29 LAB — ACETAMINOPHEN LEVEL

## 2016-10-29 LAB — CBC
HCT: 37.4 % — ABNORMAL LOW (ref 39.0–52.0)
HEMOGLOBIN: 12.9 g/dL — AB (ref 13.0–17.0)
MCH: 25.3 pg — AB (ref 26.0–34.0)
MCHC: 34.5 g/dL (ref 30.0–36.0)
MCV: 73.5 fL — ABNORMAL LOW (ref 78.0–100.0)
PLATELETS: 192 10*3/uL (ref 150–400)
RBC: 5.09 MIL/uL (ref 4.22–5.81)
RDW: 14.8 % (ref 11.5–15.5)
WBC: 13.3 10*3/uL — ABNORMAL HIGH (ref 4.0–10.5)

## 2016-10-29 LAB — ETHANOL

## 2016-10-29 LAB — SALICYLATE LEVEL

## 2016-10-29 MED ORDER — IBUPROFEN 800 MG PO TABS
800.0000 mg | ORAL_TABLET | Freq: Once | ORAL | Status: AC
  Administered 2016-10-29: 800 mg via ORAL
  Filled 2016-10-29: qty 1

## 2016-10-29 MED ORDER — CITALOPRAM HYDROBROMIDE 10 MG PO TABS
10.0000 mg | ORAL_TABLET | Freq: Every day | ORAL | Status: DC
  Administered 2016-10-29: 10 mg via ORAL
  Filled 2016-10-29: qty 1

## 2016-10-29 MED ORDER — CITALOPRAM HYDROBROMIDE 10 MG PO TABS
10.0000 mg | ORAL_TABLET | Freq: Every day | ORAL | Status: DC
  Administered 2016-10-30: 10 mg via ORAL
  Filled 2016-10-29: qty 1
  Filled 2016-10-29: qty 7
  Filled 2016-10-29: qty 1

## 2016-10-29 MED ORDER — ALUM & MAG HYDROXIDE-SIMETH 200-200-20 MG/5ML PO SUSP: 30.0000 mL | ORAL | Status: DC | PRN

## 2016-10-29 MED ORDER — MAGNESIUM HYDROXIDE 400 MG/5ML PO SUSP: 30.0000 mL | Freq: Every day | ORAL | Status: DC | PRN

## 2016-10-29 MED ORDER — LAMOTRIGINE 25 MG PO TABS
25.0000 mg | ORAL_TABLET | Freq: Every day | ORAL | Status: DC
  Administered 2016-10-29: 25 mg via ORAL
  Filled 2016-10-29: qty 1

## 2016-10-29 MED ORDER — ACETAMINOPHEN 325 MG PO TABS: 650.0000 mg | ORAL_TABLET | Freq: Four times a day (QID) | ORAL | Status: DC | PRN

## 2016-10-29 MED ORDER — LAMOTRIGINE 25 MG PO TABS
25.0000 mg | ORAL_TABLET | Freq: Every day | ORAL | Status: DC
  Administered 2016-10-30: 25 mg via ORAL
  Filled 2016-10-29: qty 7
  Filled 2016-10-29 (×2): qty 1

## 2016-10-29 MED ORDER — ASENAPINE MALEATE 5 MG SL SUBL
10.0000 mg | SUBLINGUAL_TABLET | Freq: Once | SUBLINGUAL | Status: AC
  Administered 2016-10-29: 10 mg via SUBLINGUAL
  Filled 2016-10-29: qty 2

## 2016-10-29 NOTE — ED Notes (Signed)
Pt becoming increasingly agitated with this nurse, verbally threatening, argumentative. Pt tangential, speech pressured, presents with paranoia. Pt compliant with ordered medication with much encouragement. Will continue to monitor.

## 2016-10-29 NOTE — ED Notes (Signed)
Pt withdrawn, forwards little with this nurse, poor eye contact, flat affect. Encouragement and support provided. Special checks q 15 mins in place for safety, Video monitoring in place. Will continue to monitor.
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
## 2016-10-29 NOTE — ED Notes (Signed)
Pt mother on unit to visit with pt. This nurse in pt room to discuss POC (consent to release information to mother on chart) Pt became increasingly agitated, speech tangential, pressured, Pt reports that he is being kidnapped, difficult to redirect pt behavior. This nurse notified Corliss ParishJameson,NP of pt behavior, awaiting orders.

## 2016-10-29 NOTE — ED Provider Notes (Signed)
[facility]-EMERGENCY DEPT Provider Note   CSN: [id number]  Arrival date & time: 10/29/16  [id number]  By signing my name below, I, Jerry Johnson, attest that this documentation has been prepared under the direction and in the presence of Manuel Dall, Mayer Maskerourtney F, MD. Electronically Signed: Diona BrownerJennifer Johnson, ED Scribe. 10/29/16. 4:01 AM.  History   Chief Complaint Chief Complaint  Patient presents with  . Suicidal    HPI Jerry Johnson is a 26 y.o. male with a PMHx of depression, who presents to the Emergency Department with a chief complaint of suicidal ideations that started PTA. Pt doesn't have a plan in place. Pt states there is something going on in his life that has caused him to feel this way, however, he has been very vague. He does note that he doesn't have access to guns. He was texting the suicide hotline when police showed up to this house. He has been hospitalized in the past for similar. No alcohol and drug use tonight. Pt denies fever, HI or any other complaints at this time.   The history is provided by the patient. No language interpreter was used.    Past Medical History:  Diagnosis Date  . UNSPECIFIED HYDROCELE 03/12/2009   Qualifier: Diagnosis of  By: Burnadette PopLinthavong  MD, Trisha MangleKanhka      Patient Active Problem List   Diagnosis Date Noted  . Rash and nonspecific skin eruption 01/10/2014  . Otitis externa of both ears 01/10/2014  . Acute pharyngitis 06/07/2013  . Right groin pain 05/27/2012  . CONTACT DERMATITIS 12/12/2008  . ALLERGIC RHINITIS 01/11/2008  . ATTENTION DEFICIT, W/HYPERACTIVITY 06/03/2006    Past Surgical History:  Procedure Laterality Date  . INGUINAL HERNIA REPAIR  03/21/09   Repair of R inguinal hernia with hydrocele       Home Medications    Prior to Admission medications   Not on File    Family History History reviewed. No pertinent family history.  Social History Social History  Substance Use Topics  . Smoking status: Current Every Day  Smoker    Packs/day: 0.50    Types: Cigarettes  . Smokeless tobacco: Never Used  . Alcohol use Not on file     Allergies   Patient has no known allergies.   Review of Systems Review of Systems  Constitutional: Negative for fever.  Psychiatric/Behavioral: Positive for suicidal ideas.  All other systems reviewed and are negative.    Physical Exam Updated Vital Signs BP (!) 146/93 (BP Location: Left Arm)   Pulse 79   Temp 99 F (37.2 C) (Oral)   Resp 18   SpO2 96%   Physical Exam  Constitutional: He is oriented to person, place, and time.  Tearful, will not make eye contact  HENT:  Head: Normocephalic and atraumatic.  Cardiovascular: Normal rate, regular rhythm and normal heart sounds.   No murmur heard. Pulmonary/Chest: Effort normal and breath sounds normal. No respiratory distress. He has no wheezes.  Musculoskeletal: He exhibits no edema.  Neurological: He is alert and oriented to person, place, and time.  Skin: Skin is warm and dry.  Psychiatric:  Flat, withdrawn  Nursing note and vitals reviewed.    ED Treatments / Results  DIAGNOSTIC STUDIES: Oxygen Saturation is 96% on RA, adequate by my interpretation.   COORDINATION OF CARE: 4:01 AM-Discussed next steps with pt. Pt verbalized understanding and is agreeable with the plan.   Labs (all labs ordered are listed, but only abnormal results are displayed) Labs Reviewed  COMPREHENSIVE  METABOLIC PANEL - Abnormal; Notable for the following:       Result Value   Glucose, Bld 100 (*)    Total Protein 8.3 (*)    ALT 16 (*)    Total Bilirubin 1.3 (*)    All other components within normal limits  ACETAMINOPHEN LEVEL - Abnormal; Notable for the following:    Acetaminophen (Tylenol), Serum <10 (*)    All other components within normal limits  CBC - Abnormal; Notable for the following:    WBC 13.3 (*)    Hemoglobin 12.9 (*)    HCT 37.4 (*)    MCV 73.5 (*)    MCH 25.3 (*)    All other components within  normal limits  ETHANOL  SALICYLATE LEVEL  RAPID URINE DRUG SCREEN, HOSP PERFORMED    EKG  EKG Interpretation None       Radiology No results found.  Procedures Procedures (including critical care time)  Medications Ordered in ED Medications - No data to display   Initial Impression / Assessment and Plan / ED Course  I have reviewed the triage vital signs and the nursing notes.  Pertinent labs & imaging results that were available during my care of the patient were reviewed by me and considered in my medical decision making (see chart for details).     Patient presents with suicidal ideation. Reportedly detecting suicide hotline. He is very vague and does not provide much history. He is nontoxic-appearing. Denies drug or alcohol use. Very flat and withdrawn on exam. We'll have TTS evaluate. Anticipate medical clearance up on lab work.  6:05 AM Labs review. Patient medically clear for definitive psychiatric clearance.  Final Clinical Impressions(s) / ED Diagnoses   Final diagnoses:  Suicidal ideation    New Prescriptions New Prescriptions   No medications on file   I personally performed the services described in this documentation, which was scribed in my presence. The recorded information has been reviewed and is accurate.     Shon BatonHorton, Kyeisha Janowicz F, MD 10/29/16 [phone]

## 2016-10-29 NOTE — [facility] Counselor (Signed)
Per shift report: Per Dr. Jannifer FranklinAkintayo and Tawni LevyJameson Lord, DNP patient meets criteria for INPT treatment. Pt accepted to [facility]/room: 303-2. [employer] transport pending.    Redmond Pullingreylese D Lequisha Cammack, MS, [facility], [facility] Triage Specialist [phone]

## 2016-10-29 NOTE — ED Notes (Signed)
Bed: [facility] Expected date:  Expected time:  Means of arrival:  Comments: 

## 2016-10-29 NOTE — ED Notes (Signed)
Pt called the suicide hotline and [employer] went to get him and now he's not suicidal anymore

## 2016-10-29 NOTE — ED Notes (Signed)
Pt crying in triage, not talking the the nurse, but is cooperative
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
## 2016-10-29 NOTE — ED Notes (Signed)
Patient sleeping appears calm and cooperative.  All personal items in locker returned to [employer].  [facility]

## 2016-10-29 NOTE — ED Notes (Signed)
Patient calmly walked back to unit. Flat affect and guarded when asking him some questions. Minimal answers but did answer. Reports that he has some SI but did not say plan when asked. Did not elaborates on current stressors. Fluids offered and went over where restroom is ect. Will monitor on q 15 minute checks.
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
## 2016-10-29 NOTE — Progress Notes (Signed)
10/29/16 1338:  LRT went to pt room to offer activities, pt declined.  Caroll RancherMarjette Jossiah Smoak, LRT/CTRS

## 2016-10-29 NOTE — Tx Team (Addendum)
Initial Treatment Plan 10/29/2016 9:20 PM Laurice RecordShaquille F Brazier [id number]    PATIENT STRESSORS: Financial difficulties Marital or family conflict Medication change or noncompliance   PATIENT STRENGTHS: General fund of knowledge Motivation for treatment/growth   PATIENT IDENTIFIED PROBLEMS: "I need a place to stay after I am discharged"  "I have to leave here by Monday"                   DISCHARGE CRITERIA:  Adequate post-discharge living arrangements Improved stabilization in mood, thinking, and/or behavior Verbal commitment to aftercare and medication compliance  PRELIMINARY DISCHARGE PLAN: Attend aftercare/continuing care group Placement in alternative living arrangements  PATIENT/FAMILY INVOLVEMENT: This treatment plan has been presented to and reviewed with the patient, Laurice RecordShaquille F Vanepps, and/or family member, .  The patient and family have been given the opportunity to ask questions and make suggestions.  Andrena Mewsuttall, Echo Allsbrook J, RN 10/29/2016, 9:20 PM

## 2016-10-30 DIAGNOSIS — F4323 Adjustment disorder with mixed anxiety and depressed mood: Secondary | ICD-10-CM

## 2016-10-30 DIAGNOSIS — F332 Major depressive disorder, recurrent severe without psychotic features: Secondary | ICD-10-CM

## 2016-10-30 DIAGNOSIS — F1290 Cannabis use, unspecified, uncomplicated: Secondary | ICD-10-CM

## 2016-10-30 DIAGNOSIS — R45851 Suicidal ideations: Secondary | ICD-10-CM

## 2016-10-30 DIAGNOSIS — G4700 Insomnia, unspecified: Secondary | ICD-10-CM

## 2016-10-30 HISTORY — DX: Adjustment disorder with mixed anxiety and depressed mood: F43.23

## 2016-10-30 HISTORY — DX: Suicidal ideations: R45.851

## 2016-10-30 NOTE — Progress Notes (Signed)
Patient discharged to lobby. Patient was stable and appreciative at that time. All papers were given and valuables returned. Verbal understanding expressed. Denies SI/HI and A/VH. Patient given opportunity to express concerns and ask questions.

## 2016-10-30 NOTE — [facility] Suicide Risk Assessment (Signed)
[facility] INPATIENT:  Family/Significant Other Suicide Prevention Education  Suicide Prevention Education:  Education Completed; Jerry Johnson has been identified by the patient as the family member/significant other with whom the patient will be residing, and identified as the person(s) who will aid the patient in the event of a mental health crisis (suicidal ideations/suicide attempt).  With written consent from the patient, the family member/significant other has been provided the following suicide prevention education, prior to the and/or following the discharge of the patient.  The suicide prevention education provided includes the following:  Suicide risk factors  Suicide prevention and interventions  National Suicide Hotline telephone number  [facility] assessment telephone number  [facility] Emergency Assistance 911  [facility] and/or Residential Mobile Crisis Unit telephone number  Request made of family/significant other to:  Remove weapons (e.g., guns, rifles, knives), all items previously/currently identified as safety concern.    Remove drugs/medications (over-the-counter, prescriptions, illicit drugs), all items previously/currently identified as a safety concern.  The family member/significant other verbalizes understanding of the suicide prevention education information provided.  The family member/significant other agrees to remove the items of safety concern listed above. Mother aware of steps to take if at any time patient is harmful to self or others. Mother aware that pamphlet will be sent home with patient on today. No other concerns reported at this time.   Jerry Johnson 10/30/2016, 12:08 PM

## 2016-10-30 NOTE — H&P (Signed)
Psychiatric Admission Assessment Adult  Patient Identification: Jerry Johnson  MRN:  [id number]  Date of Evaluation:  10/30/2016  Chief Complaint: Suicidal ideations.  Principal Diagnosis: Adjustment disorder with mixed emotions.  Diagnosis:   Patient Active Problem List   Diagnosis Date Noted  . MDD (major depressive disorder), recurrent severe, without psychosis ([place]) [F33.2] 10/29/2016  . Major depressive disorder, recurrent severe without psychotic features ([place]) [F33.2] 10/29/2016  . Rash and nonspecific skin eruption [R21] 01/10/2014  . Otitis externa of both ears [H60.93] 01/10/2014  . Acute pharyngitis [J02.9] 06/07/2013  . Right groin pain [R10.31] 05/27/2012  . CONTACT DERMATITIS [L25.9] 12/12/2008  . ALLERGIC RHINITIS [J30.9] 01/11/2008  . ATTENTION DEFICIT, W/HYPERACTIVITY [F90.9] 06/03/2006   History of Present Illness: (Per SRA): Patient is a 26 year old AA male transferred from [place] long ED after he was brought to the ED by [employer].  He contacted the crisis hotline reporting suicidal ideation with a plan. His girlfriend of 5 years was cheating on him, patient stated, that made him suicidal, he plan to use a knife to cut himself on his neck.  During the admission assessment this morning, Patient states that he was upset about his girlfriend behavior & the break-up of their relationship. But, has had time to think about it, has no plans to hurt himself or anyone else. He also states he has no plan of hurting his ex-girlfriend. He states that he plans to stay with his mom, adds mom is supportive, has a job interview on Monday and wants to get his life back on track. He states that he was diagnosed with ADHD and bipolar disorder when he was younger, has not been on medications since age 27 & has been doing well.   Patient's mom was contacted over the phone who stated that patient has not been on any psychotropic medications since age 14. She says he was very attached to  his girlfriend & that relationship was everything to him. She adds that he has tried to hurt himself when he was younger but has had no psychiatric issues since age 51. She states that he was just got overwhelmed, adds that she plans to monitor him closely, does not feel he needs to be in the hospital and will be staying with her after discharge. She states that she is willing to pick up the patient, take him for his outpatient appointment to [facility] on Monday.  Patient reports that he is sad about his breakup & yet, ready to move on with his life. He denies any feelings of hopelessness, worthlessness or guilt. He denies any suicidal thoughts, homicidal thoughts. He reports that he's eating & sleeping well. He denies any psychotic symptoms, any symptoms of mania. He states that he does not need to be on any medication as he is done well without them for a long time.   Associated Signs/Symptoms: Depression Symptoms:  "I'm feeling a little sad today"  (Hypo) Manic Symptoms:  Denies any hypomanic symptoms  Anxiety Symptoms:  Excessive Worry,  Psychotic Symptoms:  Denies any hallucinations, delusional thoughts or paranoia.  PTSD Symptoms: Denies  Total Time spent with patient: 1 hour  Past Psychiatric History: ADHD  Is the patient at risk to self? No.  Has the patient been a risk to self in the past 6 months? No.  Has the patient been a risk to self within the distant past? No.  Is the patient a risk to others? No.  Has the patient been a risk to  others in the past 6 months? No.  Has the patient been a risk to others within the distant past? No.   Prior Inpatient Therapy: Yes, (Childhood years). Prior Outpatient Therapy: Yes.  Alcohol Screening: 1. How often do you have a drink containing alcohol?: Never 2. How many drinks containing alcohol do you have on a typical day when you are drinking?: 1 or 2 3. How often do you have six or more drinks on one occasion?: Never Preliminary Score:  0 9. Have you or someone else been injured as a result of your drinking?: No 10. Has a relative or friend or a doctor or another health worker been concerned about your drinking or suggested you cut down?: No Alcohol Use Disorder Identification Test Final Score (AUDIT): 0 Brief Intervention: AUDIT score less than 7 or less-screening does not suggest unhealthy drinking-brief intervention not indicated  Substance Abuse History in the last 12 months:  Yes.  (UDS positive for THC).  Consequences of Substance Abuse: Medical Consequences:  Liver damage, Possible death by overdose Legal Consequences:  Arrests, jail time, Loss of driving privilege. Family Consequences:  Family discord, divorce and or separation.  Previous Psychotropic Medications: Yes, (Depakote).  Psychological Evaluations: No   Past Medical History:  Past Medical History:  Diagnosis Date  . UNSPECIFIED HYDROCELE 03/12/2009   Qualifier: Diagnosis of  By: Netty Starring  MD, Lucianne Muss      Past Surgical History:  Procedure Laterality Date  . INGUINAL HERNIA REPAIR  03/21/09   Repair of R inguinal hernia with hydrocele   Family History: History reviewed. No pertinent family history.  Family Psychiatric  History: Familial hx of Bipolar disorder.  Tobacco Screening: Have you used any form of tobacco in the last 30 days? (Cigarettes, Smokeless Tobacco, Cigars, and/or Pipes): No  Social History:  History  Alcohol use Not on file     History  Drug Use  . Frequency: 4.0 times per week  . Types: Marijuana    Comment: marijuana---"4 blunts a day"    Additional Social History: Pain Medications: Patient denies Prescriptions: Patient denies Over the Counter: Patient denies History of alcohol / drug use?: No history of alcohol / drug abuse Longest period of sobriety (when/how long): Patient denies use of substances Negative Consequences of Use: Financial, Personal relationships, Work / School  Allergies:  No Known  Allergies  Lab Results:  Results for orders placed or performed during the hospital encounter of 10/29/16 (from the past 48 hour(s))  Comprehensive metabolic panel     Status: Abnormal   Collection Time: 10/29/16  4:47 AM  Result Value Ref Range   Sodium 139 135 - 145 mmol/L   Potassium 3.6 3.5 - 5.1 mmol/L   Chloride 104 101 - 111 mmol/L   CO2 24 22 - 32 mmol/L   Glucose, Bld 100 (H) 65 - 99 mg/dL   BUN 12 6 - 20 mg/dL   Creatinine, Ser 0.83 0.61 - 1.24 mg/dL   Calcium 9.7 8.9 - 10.3 mg/dL   Total Protein 8.3 (H) 6.5 - 8.1 g/dL   Albumin 4.7 3.5 - 5.0 g/dL   AST 25 15 - 41 U/L   ALT 16 (L) 17 - 63 U/L   Alkaline Phosphatase 65 38 - 126 U/L   Total Bilirubin 1.3 (H) 0.3 - 1.2 mg/dL   GFR calc non Af Amer >60 >60 mL/min   GFR calc Af Amer >60 >60 mL/min    Comment: (NOTE) The eGFR has been calculated using the  CKD EPI equation. This calculation has not been validated in all clinical situations. eGFR's persistently <60 mL/min signify possible Chronic Kidney Disease.    Anion gap 11 5 - 15  Ethanol     Status: None   Collection Time: 10/29/16  4:47 AM  Result Value Ref Range   Alcohol, Ethyl (B) <5 <5 mg/dL    Comment:        LOWEST DETECTABLE LIMIT FOR SERUM ALCOHOL IS 5 mg/dL FOR MEDICAL PURPOSES ONLY   Salicylate level     Status: None   Collection Time: 10/29/16  4:47 AM  Result Value Ref Range   Salicylate Lvl <0.8 2.8 - 30.0 mg/dL  Acetaminophen level     Status: Abnormal   Collection Time: 10/29/16  4:47 AM  Result Value Ref Range   Acetaminophen (Tylenol), Serum <10 (L) 10 - 30 ug/mL    Comment:        THERAPEUTIC CONCENTRATIONS VARY SIGNIFICANTLY. A RANGE OF 10-30 ug/mL MAY BE AN EFFECTIVE CONCENTRATION FOR MANY PATIENTS. HOWEVER, SOME ARE BEST TREATED AT CONCENTRATIONS OUTSIDE THIS RANGE. ACETAMINOPHEN CONCENTRATIONS >150 ug/mL AT 4 HOURS AFTER INGESTION AND >50 ug/mL AT 12 HOURS AFTER INGESTION ARE OFTEN ASSOCIATED WITH TOXIC REACTIONS.   cbc      Status: Abnormal   Collection Time: 10/29/16  4:47 AM  Result Value Ref Range   WBC 13.3 (H) 4.0 - 10.5 K/uL   RBC 5.09 4.22 - 5.81 MIL/uL   Hemoglobin 12.9 (L) 13.0 - 17.0 g/dL   HCT 37.4 (L) 39.0 - 52.0 %   MCV 73.5 (L) 78.0 - 100.0 fL   MCH 25.3 (L) 26.0 - 34.0 pg   MCHC 34.5 30.0 - 36.0 g/dL   RDW 14.8 11.5 - 15.5 %   Platelets 192 150 - 400 K/uL    Comment: REPEATED TO VERIFY  Rapid urine drug screen (hospital performed)     Status: Abnormal   Collection Time: 10/29/16  5:45 AM  Result Value Ref Range   Opiates NONE DETECTED NONE DETECTED   Cocaine NONE DETECTED NONE DETECTED   Benzodiazepines NONE DETECTED NONE DETECTED   Amphetamines NONE DETECTED NONE DETECTED   Tetrahydrocannabinol POSITIVE (A) NONE DETECTED   Barbiturates NONE DETECTED NONE DETECTED    Comment:        DRUG SCREEN FOR MEDICAL PURPOSES ONLY.  IF CONFIRMATION IS NEEDED FOR ANY PURPOSE, NOTIFY LAB WITHIN 5 DAYS.        LOWEST DETECTABLE LIMITS FOR URINE DRUG SCREEN Drug Class       Cutoff (ng/mL) Amphetamine      1000 Barbiturate      200 Benzodiazepine   022 Tricyclics       336 Opiates          300 Cocaine          300 THC              50    Blood Alcohol level:  Lab Results  Component Value Date   ETH <5 10/29/2016   [facility]  05/03/2008    <5        LOWEST DETECTABLE LIMIT FOR SERUM ALCOHOL IS 5 mg/dL FOR MEDICAL PURPOSES ONLY   Metabolic Disorder Labs:  No results found for: HGBA1C, MPG No results found for: PROLACTIN No results found for: CHOL, TRIG, HDL, CHOLHDL, VLDL, LDLCALC  Current Medications: Current Facility-Administered Medications  Medication Dose Route Frequency Provider Last Rate Last Dose  . acetaminophen (TYLENOL) tablet 650 mg  650 mg Oral Q6H PRN Patrecia Pour, NP      . alum & mag hydroxide-simeth (MAALOX/MYLANTA) 200-200-20 MG/5ML suspension 30 mL  30 mL Oral Q4H PRN Patrecia Pour, NP      . citalopram (CELEXA) tablet 10 mg  10 mg Oral Daily Patrecia Pour, NP    10 mg at 10/30/16 0931  . lamoTRIgine (LAMICTAL) tablet 25 mg  25 mg Oral Daily Patrecia Pour, NP   25 mg at 10/30/16 0930  . magnesium hydroxide (MILK OF MAGNESIA) suspension 30 mL  30 mL Oral Daily PRN Patrecia Pour, NP       PTA Medications: No prescriptions prior to admission.   Musculoskeletal: Strength & Muscle Tone: within normal limits Gait & Station: normal Patient leans: N/A  Psychiatric Specialty Exam: Physical Exam  Constitutional: He appears well-developed.  HENT:  Head: Normocephalic.  Eyes: Pupils are equal, round, and reactive to light.  Neck: Normal range of motion.  Cardiovascular: Normal rate.   Respiratory: Effort normal.  GI: Soft.  Genitourinary:  Genitourinary Comments: Deferred  Musculoskeletal: Normal range of motion.  Neurological: He is alert.  Skin: Skin is warm.    Review of Systems  Constitutional: Negative.   HENT: Negative.   Eyes: Negative.   Respiratory: Negative.   Cardiovascular: Negative.   Gastrointestinal: Negative.   Genitourinary: Negative.   Musculoskeletal: Negative.   Skin: Negative.   Neurological: Negative.   Endo/Heme/Allergies: Negative.   Psychiatric/Behavioral: Positive for depression, substance abuse (UDS (+) for THC) and suicidal ideas. Negative for hallucinations and memory loss. The patient is nervous/anxious and has insomnia.     Blood pressure 138/85, pulse 87, temperature 98.1 F (36.7 C), temperature source Oral, resp. rate 18, height '5\' 10"'  (1.778 m), weight 69.9 kg (154 lb).Body mass index is 22.1 kg/m.  General Appearance: Casual  Eye Contact::  Fair  Speech:  Clear and Coherent and Normal Rate  Volume:  Normal  Mood: Sad, but denies being depressed.  Affect: Appropriate.  Thought Process:  Coherent, Goal Directed and Descriptions of Associations: Intact  Orientation:  Full (Time, Place, and Person)  Thought Content: Rumination on his relationship, denies AVH.  Suicidal Thoughts:  No  Homicidal  Thoughts:  No  Memory:  Immediate;   Fair Recent;   Fair Remote;   Fair  Judgement:  Intact  Insight:  Present  Psychomotor Activity:  Normal  Concentration:  Fair  Recall:  [employer] of Knowledge:Fair  Language: Fair  Akathisia:  No  Handed:  Right  AIMS (if indicated):     Assets:  Desire for Improvement Physical Health Social Support  Sleep:  Number of Hours: 6.75  Cognition: WNL  ADL's: Intact     Treatment Plan/Recommendations: 1. Admit for crisis management and stabilization, estimated length of stay 3-5 days.  2. Medication management to reduce current symptoms to base line and improve the patient's overall level of functioning  3. Treat health problems as indicated.  4. Develop treatment plan to decrease risk of & the need for readmission.  5. Psycho-social education regarding self worth & care.  6. Health care follow up as needed for medical problems.  7. Review, reconcile, and reinstate any pertinent home medications for other health issues where appropriate. 8. Call for consults with hospitalist for any additional specialty patient care services as needed.  Observation Level/Precautions:  15 minute checks  Laboratory:  Per ED, UDS positive for THC.  Psychotherapy: Group sessions  Medications: See MAR  Consultations: As needed   Discharge Concerns: Safety, Mood stability    Estimated LOS: 24 hours  Other: Admit to the [facility].   Physician Treatment Plan for Primary Diagnosis: Will initiate medication management for mood stability. Set up an outpatient psychiatric services for medication management. Will encourage medication adherence with psychiatric medications.  Long Term Goal(s): Improvement in symptoms so as ready for discharge  Short Term Goals: Ability to identify changes in lifestyle to reduce recurrence of condition will improve, Ability to verbalize feelings will improve, Ability to disclose and discuss suicidal ideas and Ability to demonstrate  self-control will improve  Physician Treatment Plan for Secondary Diagnosis: Active Problems:   Major depressive disorder, recurrent severe without psychotic features ([place])  Long Term Goal(s): Improvement in symptoms so as ready for discharge  Short Term Goals: Ability to identify and develop effective coping behaviors will improve, Compliance with prescribed medications will improve and Ability to identify triggers associated with substance abuse/mental health issues will improve  I certify that inpatient services furnished can reasonably be expected to improve the patient's condition.    Encarnacion Slates, NP, PMHNP, FNP-BC 7/27/20189:45 AM

## 2016-10-30 NOTE — Progress Notes (Signed)
Pt discharged home with his mother. Pt was ambulatory, stable and appreciative at that time. All papers and prescriptions were given and valuables returned. Verbal understanding expressed. Denies SI/HI and A/VH. Pt given opportunity to express concerns and ask questions. 

## 2016-10-30 NOTE — [facility] Counselor (Signed)
Patient admitted less than 24 hours, no PSA required.  Santa GeneraAnne Ezekiah Massie, LCSW Lead Clinical Social Worker Phone:  [phone]

## 2016-10-30 NOTE — Progress Notes (Signed)
Recreation Therapy Notes  Date: 10/30/2016 Time: 10:00am Location: [facility] Dayroom  Group Topic: Team-Building  Goal Area(s) Addresses:  Pts will be able to work with team members to reach a shared goal. Pts will be able to work on their ability to push through something that is challenging.  Intervention: Game  Activity: Pts will work together using a rubber band that has 6 strings tied onto it to build a a pyramid using 10 plastic cups. Once pts were finished they played a game of Pictionary.  Education: Team-Building, Discharge Planning  Education Outcome: Acknowledges understanding   Clinical Observations/Feedback: Pt joined group and then immediately left.  Marvell Fullerachel Meyer, Recreation Therapy Intern  Caroll RancherMarjette Christepher Melchior, LRT/CTRS

## 2016-10-30 NOTE — [facility] Group Notes (Signed)
[facility] LCSW Group Therapy  10/12/2014 1:15 - 2 PM  Type of Therapy:  Group Therapy  Participation Level:  Active  Participation Quality:  Appropriate, Attentive, Sharing and Supportive  Affect:  Appropriate  Cognitive:  Appropriate  Insight:  Developing/Improving  Engagement in Therapy:  Engaged  Modes of Intervention:  Discussion, Exploration, Socialization and Support  Summary of Progress/Problems:  Chaplain led group explored concept of hope and its relevance to mental health recovery.  Patients explored themes including what matters to them personally, how others responses are similar/different, and what they are hopeful for.  Group members discussed relevance of social supports, innter strength and using their own stories to craft a recovery path.  Pt related lack of hope to experience w broken relationship w significant other.  Experiences hope when feeling cared for by others but "I have always had to take care of myself."  Looking forward to discharge today, feels like he can start afresh and care for himself in a new way.  Santa Generaunningham, Shelitha Magley C LCSW

## 2016-10-30 NOTE — [facility] Suicide Risk Assessment (Signed)
[facility]   Nursing information obtained from:    Demographic factors:    Current Mental Status:    Loss Factors:    Historical Factors:    Risk Reduction Factors:     Total Time spent with patient: 1 hour Principal Problem: Adjustment disorder with mixed anxiety and depressed mood Diagnosis:   Patient Active Problem List   Diagnosis Date Noted  . Adjustment disorder with mixed anxiety and depressed mood [F43.23] 10/30/2016    Priority: High  . Suicidal ideation [R45.851] 10/30/2016    Priority: High  . Major depressive disorder, recurrent severe without psychotic features ([facility]) [F33.2] 10/29/2016    Priority: Low  . Rash and nonspecific skin eruption [R21] 01/10/2014  . Otitis externa of both ears [H60.93] 01/10/2014  . Acute pharyngitis [J02.9] 06/07/2013  . Right groin pain [R10.31] 05/27/2012  . CONTACT DERMATITIS [L25.9] 12/12/2008  . ALLERGIC RHINITIS [J30.9] 01/11/2008  . ATTENTION DEFICIT, W/HYPERACTIVITY [F90.9] 06/03/2006   Subjective Data: Patient is a 26 year old male [place] [facility] Patient was admitted and discharged on the same date. Please see discharge suicide risk assessment for details Continued Clinical Symptoms:  Alcohol Use Disorder Identification Test Final Score (AUDIT): 0 The "Alcohol Use Disorders Identification Test", Guidelines for Use in Primary Care, Second Edition.  World Science writerHealth Organization [facility](WHO). Score between 0-7:  no or low risk or alcohol related problems. Score between 8-15:  moderate risk of alcohol related problems. Score between 16-19:  high risk of alcohol related problems. Score 20 or above:  warrants further diagnostic evaluation for alcohol dependence and treatment.   CLINICAL FACTORS:   Dysthymia Alcohol/Substance Abuse/Dependencies    Psychiatric Specialty Exam: Physical Exam  ROS  Blood pressure 138/85, pulse 87, temperature 98.1 F (36.7 C), temperature source Oral, resp. rate  18, height 5\' 10"  (1.778 m), weight 154 lb (69.9 kg).Body mass index is 22.1 kg/m.    COGNITIVE FEATURES THAT CONTRIBUTE TO RISK:  None    SUICIDE RISK:   Minimal: No identifiable suicidal ideation.  Patients presenting with no risk factors but with morbid ruminations; may be classified as minimal risk based on the severity of the depressive symptoms  PLAN OF CARE: Patient's mom to pick up patient, patient to stay with mom and mom to take patient on Monday [facility]. Crisis and safety plan discussed in length  I certify that inpatient services furnished can reasonably be expected to improve the patient's condition.   Nelly RoutKUMAR,Mehki Klumpp, MD 10/30/2016, 12:39 PM

## 2016-10-30 NOTE — Progress Notes (Signed)
  [facility] :  Will you be returning to the same living situation after discharge:  Yes,  Patient is returning home with mother at [address] [place], [place] [address] At discharge, do you have transportation home?: Yes,  Mother will transport the patient at 2pm on today Do you have the ability to pay for your medications: Yes,  patient insured  Release of information consent forms completed and in the chart;  Patient's signature needed at discharge.  Patient to Follow up at: Follow-up Information    [facility] Go on 11/05/2016.   Specialty:  Behavioral Health Why:  Patient is to follow up with this provider for therapy. Initial appointment is 11/05/16 at 9:15am with Pryor Ochoaimothy Bradshaw. Agency also has Open Access hours on Monday through Friday from 8am-3pm.  Contact information: [address] [place] [address] [phone]           Next level of care provider [facility] Link:no  Safety Planning and Suicide Prevention discussed: Yes,  Mother: Boyd Kerbsenny  Have you used any form of tobacco in the last 30 days? (Cigarettes, Smokeless Tobacco, Cigars, and/or Pipes): No  Has patient been referred to the Quitline?: Patient refused referral  Patient has been referred for addiction treatment: N/A  Loleta DickerJoyce S Laci Frenkel 10/30/2016, 12:05 PM

## 2016-10-30 NOTE — [facility] Suicide Risk Assessment (Signed)
[facility]   Principal Problem: Adjustment disorder with mixed anxiety and depressed mood Discharge Diagnoses:  Patient Active Problem List   Diagnosis Date Noted  . MDD (major depressive disorder), recurrent severe, without psychosis (HCC) [F33.2] 10/29/2016  . Major depressive disorder, recurrent severe without psychotic features (HCC) [F33.2] 10/29/2016  . Rash and nonspecific skin eruption [R21] 01/10/2014  . Otitis externa of both ears [H60.93] 01/10/2014  . Acute pharyngitis [J02.9] 06/07/2013  . Right groin pain [R10.31] 05/27/2012  . CONTACT DERMATITIS [L25.9] 12/12/2008  . ALLERGIC RHINITIS [J30.9] 01/11/2008  . ATTENTION DEFICIT, W/HYPERACTIVITY [F90.9] 06/03/2006  Patient is a 26 year old male [facility] after he was brought to the ED by [employer] after hecontacted the crisis hotline reporting suicidal ideation with a plan while he was in the foot. His girlfriend of 5 years was cheating on him, patient stated that that made him suicidal, he plan to use a knife to cut himself on his neck.  Patient this morning states that he was upset about his girlfriend, has had time to think about it, has no plans on hurting himself. He also states he has no plan of hurting his ex-girlfriend. He states that he plans to stay with his mom, adds mom is supportive, has a job interview and Monday and wants to get his life back on track. He states that he was diagnosed with ADHD and bipolar disorder when he was younger, has not been on medications since age 26. Patient's mom was contacted over the phone who stated that patient's not been on any psychotropic medications since age 26, was very attached to his girlfriend initiated everything for him, adds that he has tried to hurt himself when he was younger but has had no psychiatric issues since age 26. She states that he was just overwhelmed, adds that she plans to monitor him closely, does not feel he needs to be in  the hospital and will be staying with her. She states that she is willing to pick up the patient, take him [facility].  Patient reports that he is sad about his breakup, denies any feelings of hopelessness, worthlessness or guilt. He denies any suicidal thoughts, homicidal thoughts. He reports that he's eating fine and sleeping well. He denies any psychotic symptoms, any symptoms of mania. He states that he does not need to be on any medication as he is done well without them  Total Time spent with patient: 1 hour  Musculoskeletal: Strength & Muscle Tone: within normal limits Gait & Station: normal Patient leans: N/A  Psychiatric Specialty Exam: Review of Systems  Constitutional: Negative.  Negative for chills, fever, malaise/fatigue and weight loss.  HENT: Negative.  Negative for congestion and sore throat.   Eyes: Negative.  Negative for blurred vision, double vision, discharge and redness.  Respiratory: Negative.  Negative for cough, shortness of breath and wheezing.   Cardiovascular: Negative.  Negative for chest pain and palpitations.  Gastrointestinal: Negative.  Negative for abdominal pain, constipation, diarrhea, heartburn, nausea and vomiting.  Musculoskeletal: Negative.  Negative for falls, joint pain and myalgias.  Neurological: Negative.  Negative for dizziness, seizures, loss of consciousness and headaches.  Endo/Heme/Allergies: Negative.  Negative for environmental allergies.  Psychiatric/Behavioral: Positive for depression and substance abuse. Negative for hallucinations and suicidal ideas. The patient is not nervous/anxious and does not have insomnia.        Patient's urine toxicology was positive for cannabis  Blood pressure 138/85, pulse 87, temperature 98.1 F (36.7 C), temperature source Oral, resp. rate 18, height 5\' 10"  (1.778 m), weight 154 lb (69.9 kg).Body mass index is 22.1 kg/m.  General Appearance: Casual  Eye  Contact::  Fair  Speech:  Clear and Coherent and Normal Rate409  Volume:  Normal  Mood:  Dysphoric  Affect:  Appropriate, Congruent and Full Range  Thought Process:  Coherent, Goal Directed and Descriptions of Associations: Intact  Orientation:  Full (Time, Place, and Person)  Thought Content:  Logical and Rumination  Suicidal Thoughts:  No  Homicidal Thoughts:  No  Memory:  Immediate;   Fair Recent;   Fair Remote;   Fair  Judgement:  Intact  Insight:  Present  Psychomotor Activity:  Normal  Concentration:  Fair  Recall:  [employer]Fair  Fund of Knowledge:Fair  Language: Fair  Akathisia:  No  Handed:  Right  AIMS (if indicated):     Assets:  Desire for Improvement Physical Health Social Support  Sleep:  Number of Hours: 6.75  Cognition: WNL  ADL's:  Intact   Mental Status Per Nursing Assessment::   On Admission:     Demographic Factors:  Male, Low socioeconomic status and Unemployed  Loss Factors: Loss of significant relationship and Financial problems/change in socioeconomic status  Historical Factors: Prior suicide attempts and Family history of mental illness or substance abuse  Risk Reduction Factors:   Sense of responsibility to family, Religious beliefs about death, Living with another person, especially a relative and Positive social support  Continued Clinical Symptoms:  Dysthymia More than one psychiatric diagnosis Previous Psychiatric Diagnoses and Treatments  Cognitive Features That Contribute To Risk:  None    Suicide Risk:  Minimal: No identifiable suicidal ideation.  Patients presenting with no risk factors but with morbid ruminations; may be classified as minimal risk based on the severity of the depressive symptoms  Follow-up Information    [facility]. Go on 11/05/2016.   Specialty:  Behavioral Health Why:  Patient is to follow up with this provider for therapy. Initial appointment is 11/05/16 at 9:15am with Pryor Ochoaimothy Bradshaw. Agency also has Open Access hours  on Monday through Friday from 8am-3pm.  Contact information: [address] [place] [address] [phone]           Plan Of Care/Follow-up recommendations:  Activity:  As tolerated Diet:  Regular Other:  [facility] Crisis and safety plan was discussed in length with patient and his mom. Mom plans to the Patient, monitor him closely, and patient will be staying with mom. Mom also plans to take patient for his initial [facility]. Nelly RoutKUMAR,Chimamanda Siegfried, MD 10/30/2016, 12:25 PM

## 2016-10-30 NOTE — Discharge Summary (Signed)
Physician Discharge Summary Note  Patient:  Jerry Johnson is an 26 y.o., male MRN:  [id number] DOB:  03/28/1991 Patient phone:  [phone] (home)  [place]   [place] [id number],  Total Time spent with patient: 15 minutes  Date of Admission:  10/29/2016 Date of Discharge: 10-30-16  Reason for Admission: Suicidal ideations.  Principal Problem: Adjustment disorder with mixed anxiety and depressed mood  Discharge Diagnoses: Patient Active Problem List   Diagnosis Date Noted  . Adjustment disorder with mixed anxiety and depressed mood [F43.23] 10/30/2016  . Suicidal ideation [R45.851] 10/30/2016  . Major depressive disorder, recurrent severe without psychotic features (HCC) [F33.2] 10/29/2016  . Rash and nonspecific skin eruption [R21] 01/10/2014  . Otitis externa of both ears [H60.93] 01/10/2014  . Acute pharyngitis [J02.9] 06/07/2013  . Right groin pain [R10.31] 05/27/2012  . CONTACT DERMATITIS [L25.9] 12/12/2008  . ALLERGIC RHINITIS [J30.9] 01/11/2008  . ATTENTION DEFICIT, W/HYPERACTIVITY [F90.9] 06/03/2006   Past Psychiatric History: ADHD, Bipolar disorder.  Past Medical History:  Past Medical History:  Diagnosis Date  . UNSPECIFIED HYDROCELE 03/12/2009   Qualifier: Diagnosis of  By: Burnadette PopLinthavong  MD, Trisha MangleKanhka      Past Surgical History:  Procedure Laterality Date  . INGUINAL HERNIA REPAIR  03/21/09   Repair of R inguinal hernia with hydrocele   Family History: History reviewed. No pertinent family history.  Family Psychiatric  History: Bipolar disorder.  Social History:  History  Alcohol use Not on file     History  Drug Use  . Frequency: 4.0 times per week  . Types: Marijuana    Comment: marijuana---"4 blunts a day"    Social History   Social History  . Marital status: Single    Spouse name: N/A  . Number of children: N/A  . Years of education: N/A   Social History Main Topics  . Smoking status: Current Every Day Smoker     Packs/day: 0.50    Types: Cigarettes  . Smokeless tobacco: Never Used  . Alcohol use None  . Drug use: Yes    Frequency: 4.0 times per week    Types: Marijuana     Comment: marijuana---"4 blunts a day"  . Sexual activity: Not Asked   Other Topics Concern  . None   Social History Narrative  . None   Hospital Course: (Per SRA): Patient is a 26 year old male transferred [facility] after he was brought to the ED by [employer] after he contacted the crisis hotline reporting suicidal ideation with a plan while he was in the foot. His girlfriend of 5 years was cheating on him, patient stated that that made him suicidal, he plan to use a knife to cut himself on his neck.  Patient this morning states that he was upset about his girlfriend cheating on him that led to their break-up. He says he has had time to think about the situation since coming to the hospital. He says he has no plans of hurting himself any longer. He also states he has no plan of hurting his ex-girlfriend. He wants to be discharged & plans to stay with his mom after discharge. He adds that his mom is supportive. Jerry Johnson says he has a job interview on Monday and wants to get his life back on track. He states that he was diagnosed with ADHD and bipolar disorder when he was younger, has not been on medications since age 26. Patient's mom was contacted over the phone who stated that patient  has not been on any psychotropic medications since age 26. She says Jerry Johnson was very attached to his girlfriend & the relationship they had together. She adds that he has tried to hurt himself when he was younger but has had no psychiatric issues since age 26. She states that he was just overwhelmed, adds that she will monitor him closely once he returns home with her. Jerry Johnson at this time says he does not feel he needs to be in the hospital and will be staying with his mother upon discharge. Mother states that she is willing to pick up the Jerry Johnson, take him for  [facility].  Patient reports that he is sad about his relationship breakup, but denies any feelings of hopelessness, worthlessness or guilt. He denies any suicidal thoughts, homicidal thoughts or AVH. He reports that he's eating & sleeping well. He denies any psychotic symptoms, any symptoms of mania. He states that he does not need to be on any medication as he is done well without them for several years now. And because there no clinical criteria to keep Jerry Johnson admitted to the hospital, he is currently being discharged to the home of his mother. He left [facility] in no apparent distress. Transportation per mother.  Physical Findings: AIMS: Facial and Oral Movements Muscles of Facial Expression: None, normal Lips and Perioral Area: None, normal Jaw: None, normal Tongue: None, normal,Extremity Movements Upper (arms, wrists, hands, fingers): None, normal Lower (legs, knees, ankles, toes): None, normal, Trunk Movements Neck, shoulders, hips: None, normal, Overall Severity Severity of abnormal movements (highest score from questions above): None, normal Incapacitation due to abnormal movements: None, normal Patient's awareness of abnormal movements (rate only patient's report): No Awareness, Dental Status Current problems with teeth and/or dentures?: No Does patient usually wear dentures?: No  CIWA:    COWS:     Musculoskeletal: Strength & Muscle Tone: within normal limits Gait & Station: normal Patient leans: N/A  Psychiatric Specialty Exam: Physical Exam  Constitutional: He appears well-developed.  HENT:  Head: Normocephalic.  Eyes: Pupils are equal, round, and reactive to light.  Neck: Normal range of motion.  Cardiovascular: Normal rate.   Respiratory: Effort normal.  GI: Soft.  Genitourinary:  Genitourinary Comments: Deferred  Musculoskeletal: Normal range of motion.  Neurological: He is alert.  Skin: Skin is warm.    Review of Systems   Constitutional: Negative.   HENT: Negative.   Eyes: Negative.   Respiratory: Negative.   Cardiovascular: Negative.   Gastrointestinal: Negative.   Genitourinary: Negative.   Musculoskeletal: Negative.   Skin: Negative.   Neurological: Negative.   Endo/Heme/Allergies: Negative.   Psychiatric/Behavioral: Positive for substance abuse (Hx. Cannabis use disorder). Negative for hallucinations, memory loss and suicidal ideas. Depression: Stable. The patient is not nervous/anxious and does not have insomnia.     Blood pressure 138/85, pulse 87, temperature 98.1 F (36.7 C), temperature source Oral, resp. rate 18, height 5\' 10"  (1.778 m), weight 69.9 kg (154 lb).Body mass index is 22.1 kg/m.  See Md's SRA   Have you used any form of tobacco in the last 30 days? (Cigarettes, Smokeless Tobacco, Cigars, and/or Pipes): No  Has this patient used any form of tobacco in the last 30 days? (Cigarettes, Smokeless Tobacco, Cigars, and/or Pipes): No.   Blood Alcohol level:  Lab Results  Component Value Date   [facility] 10/[facility]2016   [facility]  05/03[facility]    <5        LOWEST DETECTABLE LIMIT  FOR SERUM ALCOHOL IS 5 mg/dL FOR MEDICAL PURPOSES ONLY   Metabolic Disorder Labs:  No results found for: HGBA1C, MPG No results found for: PROLACTIN No results found for: CHOL, TRIG, HDL, CHOLHDL, VLDL, LDLCALC  See Psychiatric Specialty Exam and Suicide Risk Assessment completed by Attending Physician prior to discharge.  Discharge destination:  Home  Is patient on multiple antipsychotic therapies at discharge:  No   Has Patient had three or more failed trials of antipsychotic monotherapy by history:  No  Recommended Plan for Multiple Antipsychotic Therapies: NA   Allergies as of 10/30/2016   No Known Allergies     Medication List    You have not been prescribed any medications.    Follow-up Information    [facility]. Go on 11/05/2016.   Specialty:  Behavioral Health Why:  Patient is to follow up with  this provider for therapy. Initial appointment is 11/05/16 at 9:15am with Pryor Ochoa. Agency also has Open Access hours on Monday through Friday from 8am-3pm.  Contact information: [address] [place] [address] [phone]          Follow-up recommendations: Activity:  As tolerated Diet: As recommended by your primary care doctor. Keep all scheduled follow-up appointments as recommended.  Comments: Patient is instructed prior to discharge to: Take all medications as prescribed by his/her mental healthcare provider. Report any adverse effects and or reactions from the medicines to his/her outpatient provider promptly. Patient has been instructed & cautioned: To not engage in alcohol and or illegal drug use while on prescription medicines. In the event of worsening symptoms, patient is instructed to call the crisis hotline, 911 and or go to the nearest ED for appropriate evaluation and treatment of symptoms. To follow-up with his/her primary care provider for your other medical issues, concerns and or health care needs.   Signed: Sanjuana Kava, NP, PMHNP, FNP-BC 10/30/2016, 1:04 PM

## 2016-10-30 NOTE — Progress Notes (Signed)
[facility]   Date/time: 10/30/16  Group Description: Discharge planning group reviews patient's anticipated discharge plans and assists patients to anticipate and address any barriers to wellness/recovery in the community. Suicide prevention education is reviewed with patients in group.   Participation Quality: Patient did not attend  Plan for Discharge/Comments:Wendi Lastra Marlowe ShoresSmyre, LCSWA  Clinical Social Worker [facility] Ph: [phone]

## 2017-08-29 ENCOUNTER — Encounter (HOSPITAL_COMMUNITY): Payer: Self-pay

## 2017-08-29 ENCOUNTER — Emergency Department (HOSPITAL_COMMUNITY): Payer: Self-pay

## 2017-08-29 ENCOUNTER — Emergency Department (HOSPITAL_COMMUNITY)
Admission: EM | Admit: 2017-08-29 | Discharge: 2017-08-29 | Disposition: A | Discharge status: Home / Self Care | Payer: Self-pay | Attending: Emergency Medicine | Admitting: Emergency Medicine

## 2017-08-29 ENCOUNTER — Other Ambulatory Visit: Payer: Self-pay

## 2017-08-29 DIAGNOSIS — F1210 Cannabis abuse, uncomplicated: Secondary | ICD-10-CM | POA: Insufficient documentation

## 2017-08-29 DIAGNOSIS — S62154A Nondisplaced fracture of hook process of hamate [unciform] bone, right wrist, initial encounter for closed fracture: Secondary | ICD-10-CM | POA: Insufficient documentation

## 2017-08-29 DIAGNOSIS — W2209XA Striking against other stationary object, initial encounter: Secondary | ICD-10-CM | POA: Insufficient documentation

## 2017-08-29 DIAGNOSIS — Y929 Unspecified place or not applicable: Secondary | ICD-10-CM | POA: Insufficient documentation

## 2017-08-29 DIAGNOSIS — S62144A Nondisplaced fracture of body of hamate [unciform] bone, right wrist, initial encounter for closed fracture: Secondary | ICD-10-CM | POA: Insufficient documentation

## 2017-08-29 DIAGNOSIS — Y939 Activity, unspecified: Secondary | ICD-10-CM | POA: Insufficient documentation

## 2017-08-29 DIAGNOSIS — Z23 Encounter for immunization: Secondary | ICD-10-CM | POA: Insufficient documentation

## 2017-08-29 DIAGNOSIS — S62336A Displaced fracture of neck of fifth metacarpal bone, right hand, initial encounter for closed fracture: Secondary | ICD-10-CM | POA: Insufficient documentation

## 2017-08-29 DIAGNOSIS — S91111A Laceration without foreign body of right great toe without damage to nail, initial encounter: Secondary | ICD-10-CM | POA: Insufficient documentation

## 2017-08-29 DIAGNOSIS — F17210 Nicotine dependence, cigarettes, uncomplicated: Secondary | ICD-10-CM | POA: Insufficient documentation

## 2017-08-29 DIAGNOSIS — Y999 Unspecified external cause status: Secondary | ICD-10-CM | POA: Insufficient documentation

## 2017-08-29 MED ORDER — TETANUS-DIPHTH-ACELL PERTUSSIS 5-2.5-18.5 LF-MCG/0.5 IM SUSP
0.5000 mL | Freq: Once | INTRAMUSCULAR | Status: AC
  Administered 2017-08-29: 0.5 mL via INTRAMUSCULAR
  Filled 2017-08-29: qty 0.5

## 2017-08-29 MED ORDER — OXYCODONE HCL 5 MG PO TABS: 5.0000 mg | ORAL_TABLET | Freq: Four times a day (QID) | ORAL | 0 refills | Status: DC | PRN

## 2017-08-29 MED ORDER — HYDROCODONE-ACETAMINOPHEN 5-325 MG PO TABS
1.0000 | ORAL_TABLET | Freq: Once | ORAL | Status: AC
  Administered 2017-08-29: 1 via ORAL
  Filled 2017-08-29: qty 1

## 2017-08-29 MED ORDER — CEPHALEXIN 500 MG PO CAPS: 500.0000 mg | ORAL_CAPSULE | Freq: Four times a day (QID) | ORAL | 0 refills | Status: AC

## 2017-08-29 MED ORDER — CEPHALEXIN 500 MG PO CAPS
500.0000 mg | ORAL_CAPSULE | Freq: Once | ORAL | Status: AC
  Administered 2017-08-29: 500 mg via ORAL
  Filled 2017-08-29: qty 1
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
## 2017-08-29 NOTE — Discharge Instructions (Addendum)
Please take all of your antibiotics until finished!   You may develop abdominal discomfort or diarrhea from the antibiotic.  You may help offset this with probiotics which you can buy or get in yogurt. Do not eat  or take the probiotics until 2 hours after your antibiotic.   Alternate 600 mg of ibuprofen and [phone] mg of Tylenol every 3 hours as needed for pain. Do not exceed 4000 mg of Tylenol daily.  Take ibuprofen with food to avoid upset stomach issues.  If you are having ongoing or worsening pain you may take oxycodone for severe pain but do not drive, drink alcohol, or operate heavy machinery if you decide to take this medicine.  This medicine may make you drowsy.  Keep the splint clean and dry.  Cover it in a bag and tape it to make sure that it does not get wet when you shower.  Elevate the extremity and apply ice 20 minutes on 20 minutes off.  With regards to your toe injury, keep the toe clean and dry.  You may apply antibiotic ointment and a clean dressing which you should change twice daily, preferably before and after work.  Follow-up with Dr. Aundria Rud with orthopedics for reevaluation of your symptoms in the next 10 to 12 days.  Return to the emergency department immediately for any concerning signs or symptoms develop such as severe swelling, fevers, abnormal drainage, or worsening pain.

## 2017-08-29 NOTE — ED Triage Notes (Signed)
PT C/O PAIN TO THE RIGHT HAND AND WRIST AFTER PUNCHING A WOOD FRAME YESTERDAY. PT UNABLE TO EXTEND OR FLEX THE FINGERS. PT ALSO C/O A LACERATION TO THE RIGHT GREAT TOE SINCE Monday WHILE TAKING OUT THE TRASH. PT STS IT TOOK 2 DAYS TO STOP THE BLEEDING, BUT THE TOE HAS A LOT OF PAIN.

## 2017-10-04 ENCOUNTER — Other Ambulatory Visit: Payer: Self-pay

## 2017-10-04 ENCOUNTER — Emergency Department (HOSPITAL_COMMUNITY): Payer: Self-pay

## 2017-10-04 ENCOUNTER — Emergency Department (HOSPITAL_COMMUNITY)
Admission: EM | Admit: 2017-10-04 | Discharge: 2017-10-05 | Disposition: A | Discharge status: Home / Self Care | Payer: Self-pay | Attending: Emergency Medicine | Admitting: Emergency Medicine

## 2017-10-04 ENCOUNTER — Encounter (HOSPITAL_COMMUNITY): Payer: Self-pay | Admitting: Emergency Medicine

## 2017-10-04 DIAGNOSIS — J039 Acute tonsillitis, unspecified: Secondary | ICD-10-CM | POA: Insufficient documentation

## 2017-10-04 DIAGNOSIS — J029 Acute pharyngitis, unspecified: Secondary | ICD-10-CM | POA: Insufficient documentation

## 2017-10-04 DIAGNOSIS — F17210 Nicotine dependence, cigarettes, uncomplicated: Secondary | ICD-10-CM | POA: Insufficient documentation

## 2017-10-04 LAB — CBC WITH DIFFERENTIAL/PLATELET
BASOS ABS: 0 10*3/uL (ref 0.0–0.1)
Basophils Relative: 0 %
EOS PCT: 5 %
Eosinophils Absolute: 0.6 10*3/uL (ref 0.0–0.7)
HEMATOCRIT: 36.2 % — AB (ref 39.0–52.0)
Hemoglobin: 11.5 g/dL — ABNORMAL LOW (ref 13.0–17.0)
LYMPHS ABS: 1.7 10*3/uL (ref 0.7–4.0)
LYMPHS PCT: 15 %
MCH: 24.9 pg — AB (ref 26.0–34.0)
MCHC: 31.8 g/dL (ref 30.0–36.0)
MCV: 78.5 fL (ref 78.0–100.0)
MONO ABS: 0.9 10*3/uL (ref 0.1–1.0)
MONOS PCT: 8 %
NEUTROS ABS: 8.3 10*3/uL — AB (ref 1.7–7.7)
Neutrophils Relative %: 72 %
PLATELETS: 182 10*3/uL (ref 150–400)
RBC: 4.61 MIL/uL (ref 4.22–5.81)
RDW: 15.2 % (ref 11.5–15.5)
WBC: 11.5 10*3/uL — ABNORMAL HIGH (ref 4.0–10.5)

## 2017-10-04 LAB — I-STAT CHEM 8, ED
BUN: 10 mg/dL (ref 6–20)
CREATININE: 0.7 mg/dL (ref 0.61–1.24)
Calcium, Ion: 1.24 mmol/L (ref 1.15–1.40)
Chloride: 104 mmol/L (ref 98–111)
Glucose, Bld: 107 mg/dL — ABNORMAL HIGH (ref 70–99)
HCT: 38 % — ABNORMAL LOW (ref 39.0–52.0)
Hemoglobin: 12.9 g/dL — ABNORMAL LOW (ref 13.0–17.0)
Potassium: 3.6 mmol/L (ref 3.5–5.1)
Sodium: 141 mmol/L (ref 135–145)
TCO2: 24 mmol/L (ref 22–32)

## 2017-10-04 MED ORDER — FENTANYL CITRATE (PF) 100 MCG/2ML IJ SOLN
100.0000 ug | Freq: Once | INTRAMUSCULAR | Status: AC
  Administered 2017-10-04: 100 ug via INTRAVENOUS
  Filled 2017-10-04: qty 2

## 2017-10-04 MED ORDER — HYDROCODONE-ACETAMINOPHEN 5-325 MG PO TABS: 1.0000 | ORAL_TABLET | Freq: Four times a day (QID) | ORAL | 0 refills | Status: DC | PRN

## 2017-10-04 MED ORDER — DEXAMETHASONE SODIUM PHOSPHATE 10 MG/ML IJ SOLN
10.0000 mg | Freq: Once | INTRAMUSCULAR | Status: AC
  Administered 2017-10-04: 10 mg via INTRAVENOUS
  Filled 2017-10-04: qty 1

## 2017-10-04 MED ORDER — SODIUM CHLORIDE 0.9 % IV BOLUS
1000.0000 mL | Freq: Once | INTRAVENOUS | Status: AC
  Administered 2017-10-04: 1000 mL via INTRAVENOUS

## 2017-10-04 MED ORDER — IOPAMIDOL (ISOVUE-300) INJECTION 61%
75.0000 mL | Freq: Once | INTRAVENOUS | Status: AC | PRN
  Administered 2017-10-04: 75 mL via INTRAVENOUS

## 2017-10-04 MED ORDER — PENICILLIN G BENZATHINE 1200000 UNIT/2ML IM SUSP
1.2000 10*6.[IU] | Freq: Once | INTRAMUSCULAR | Status: AC
  Administered 2017-10-05: 1.2 10*6.[IU] via INTRAMUSCULAR
  Filled 2017-10-04: qty 2

## 2017-10-04 NOTE — ED Provider Notes (Signed)
[facility]-EMERGENCY DEPT Provider Note   CSN: [id number] Arrival date & time: 10/04/17  2050     History   Chief Complaint No chief complaint on file.   HPI Laurice RecordShaquille F Delong is a 27 y.o. male.  HPI Patient with left jaw pain and swelling.  States that his gum was swollen by his posterior tooth on the left side.  States began acutely today.  Pain with swallowing.  Denies trauma.  Denies bad teeth there.  States the pain also goes down his neck.  No fevers. Past Medical History:  Diagnosis Date  . UNSPECIFIED HYDROCELE 03/12/2009   Qualifier: Diagnosis of  By: Burnadette PopLinthavong  MD, Trisha MangleKanhka      Patient Active Problem List   Diagnosis Date Noted  . Adjustment disorder with mixed anxiety and depressed mood 10/30/2016  . Suicidal ideation 10/30/2016  . 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 Rash and nonspecific skin eruption 01/10/2014  . Otitis externa of both ears 01/10/2014  . Acute pharyngitis 06/07/2013  . Right groin pain 05/27/2012  . CONTACT DERMATITIS 12/12/2008  . ALLERGIC RHINITIS 01/11/2008  . ATTENTION DEFICIT, W/HYPERACTIVITY 06/03/2006    Past Surgical History:  Procedure Laterality Date  . INGUINAL HERNIA REPAIR  03/21/09   Repair of R inguinal hernia with hydrocele        Home Medications    Prior to Admission medications   Medication Sig Start Date End Date Taking? Authorizing Provider  oxyCODONE (ROXICODONE) 5 MG immediate release tablet Take 1 tablet (5 mg total) by mouth every 6 (six) hours as needed for severe pain. 08/29/17   Jeanie SewerFawze, Mina A, PA-C    Family History History reviewed. No pertinent family history.  Social History Social History   Tobacco Use  . Smoking status: Current Every Day Smoker    Packs/day: 0.50    Types: Cigarettes  . Smokeless tobacco: Never Used  Substance Use Topics  . Alcohol use: Yes    Comment: OCCASIONAL  . Drug use: Yes   Frequency: 4.0 times per week    Types: Marijuana    Comment: marijuana---"4 blunts a day"     Allergies   Patient has no known allergies.   Review of Systems Review of Systems  Constitutional: Negative for appetite change and fever.  HENT: Positive for sore throat and trouble swallowing. Negative for congestion.   Respiratory: Negative for shortness of breath.   Cardiovascular: Negative for chest pain.  Gastrointestinal: Negative for abdominal pain.  Genitourinary: Negative for flank pain.  Musculoskeletal: Negative for back pain.  Skin: Negative for rash.  Neurological: Negative for weakness.  Psychiatric/Behavioral: Negative for confusion.     Physical Exam Updated Vital Signs BP 132/79 (BP Location: Left Arm)   Pulse 73   Temp 99.5 F (37.5 C) (Oral)   Resp 18   Ht 5\' 11"  (1.803 m)   Wt 79.4 kg (175 lb)   SpO2 98%   BMI 24.41 kg/m   Physical Exam  Constitutional: He appears well-developed.  Patient appears uncomfortable  HENT:  Head: Normocephalic.  Tenderness to left mandible posterior to most posterior tooth on the left side.  May have some mild swelling of the gums but no clear edema or abscess.  Mild tenderness over the cheek also.  Some difficulty opening mouth per patient but no visualized exudate on posterior pharynx.  No stridor.  Eyes: Pupils are equal, round, and reactive to light.  Neck: Neck supple.  Cardiovascular:  Normal rate.  Pulmonary/Chest: Effort normal.  Abdominal: Soft.  Musculoskeletal: He exhibits no tenderness.  Neurological: He is alert.  Skin: Skin is warm. Capillary refill takes less than 2 seconds.     ED Treatments / Results  Labs (all labs ordered are listed, but only abnormal results are displayed) Labs Reviewed  I-STAT CHEM 8, ED - Abnormal; Notable for the following components:      Result Value   Glucose, Bld 107 (*)    Hemoglobin 12.9 (*)    HCT 38.0 (*)    All other components within normal limits  CBC WITH  DIFFERENTIAL/PLATELET    EKG None  Radiology No results found.  Procedures Procedures (including critical care time)  Medications Ordered in ED Medications  sodium chloride 0.9 % bolus 1,000 mL (1,000 mLs Intravenous New Bag/Given 10/04/17 2300)  iopamidol (ISOVUE-300) 61 % injection 75 mL (has no administration in time range)  fentaNYL (SUBLIMAZE) injection 100 mcg (100 mcg Intravenous Given 10/04/17 2300)     Initial Impression / Assessment and Plan / ED Course  I have reviewed the triage vital signs and the nursing notes.  Pertinent labs & imaging results that were available during my care of the patient were reviewed by me and considered in my medical decision making (see chart for details).     Patient with jaw and posterior pharynx pain on the left side.  Patient states it is more on the outside of the jaw.  Some difficulty with examination.  Will get lab work and CT scan.  Care will be turned over to [facility]  Final Clinical Impressions(s) / ED Diagnoses   Final diagnoses:  Jaw pain    ED Discharge Orders    None       Benjiman Core, MD 10/04/17 2315

## 2017-10-04 NOTE — ED Notes (Signed)
Full rainbow sent 

## 2017-10-04 NOTE — ED Notes (Signed)
Ice pack given to pt.

## 2018-04-22 ENCOUNTER — Encounter (HOSPITAL_COMMUNITY): Payer: Self-pay

## 2018-04-22 ENCOUNTER — Other Ambulatory Visit: Payer: Self-pay

## 2018-04-22 ENCOUNTER — Emergency Department (HOSPITAL_COMMUNITY)
Admission: EM | Admit: 2018-04-22 | Discharge: 2018-04-23 | Disposition: A | Discharge status: Home / Self Care | Payer: Medicaid Other | Attending: Emergency Medicine | Admitting: Emergency Medicine

## 2018-04-22 DIAGNOSIS — J029 Acute pharyngitis, unspecified: Secondary | ICD-10-CM | POA: Insufficient documentation

## 2018-04-22 DIAGNOSIS — M545 Low back pain, unspecified: Secondary | ICD-10-CM

## 2018-04-22 DIAGNOSIS — M79644 Pain in right finger(s): Secondary | ICD-10-CM | POA: Insufficient documentation

## 2018-04-22 DIAGNOSIS — R05 Cough: Secondary | ICD-10-CM | POA: Insufficient documentation

## 2018-04-22 DIAGNOSIS — F17210 Nicotine dependence, cigarettes, uncomplicated: Secondary | ICD-10-CM | POA: Insufficient documentation

## 2018-04-22 DIAGNOSIS — R600 Localized edema: Secondary | ICD-10-CM | POA: Insufficient documentation

## 2018-04-22 DIAGNOSIS — M79641 Pain in right hand: Secondary | ICD-10-CM | POA: Insufficient documentation

## 2018-04-22 DIAGNOSIS — R200 Anesthesia of skin: Secondary | ICD-10-CM | POA: Insufficient documentation

## 2018-04-22 MED ORDER — ALBUTEROL SULFATE HFA 108 (90 BASE) MCG/ACT IN AERS: 2.0000 | INHALATION_SPRAY | RESPIRATORY_TRACT | Status: DC | PRN

## 2018-04-22 MED ORDER — IBUPROFEN 600 MG PO TABS: 600.0000 mg | ORAL_TABLET | Freq: Four times a day (QID) | ORAL | 0 refills | Status: DC | PRN

## 2018-04-22 NOTE — ED Provider Notes (Signed)
[facility]-EMERGENCY DEPT Provider Note   CSN: [id number] Arrival date & time: 04/22/18  2007     History   Chief Complaint Chief Complaint  Patient presents with  . Cough  . Sore Throat  . Hand Pain    HPI Jerry Johnson is a 28 y.o. male.  Patient to ED with multiple complaints. First, he reports burning, intermittent pain in the right 5th finger and ulnar aspect of hand. He reports fracture injury last year that was never follow up with orthopedics. He feels that the sharp pain he is experiencing is nerve injury because he did not follow through with care of the fracture. No new injury. He reports swelling around the 5th MCP over the course of week. No redness, wound. He also complains of lower back pain. He has a chronic cough that became worse for a period of recent time and this is when he realized that the cough was causing lower back pain. No fever. He is a smoker.   The history is provided by the patient. No language interpreter was used.  Cough  Associated symptoms: sore throat (off and on)   Associated symptoms: no chills, no fever, no shortness of breath and no wheezing   Sore Throat  Pertinent negatives include no abdominal pain and no shortness of breath.  Hand Pain  Pertinent negatives include no abdominal pain and no shortness of breath.    Past Medical History:  Diagnosis Date  . UNSPECIFIED HYDROCELE 03/12/2009   Qualifier: Diagnosis of  By: Burnadette PopLinthavong  MD, Trisha MangleKanhka      Patient Active Problem List   Diagnosis Date Noted  . Adjustment disorder with mixed anxiety and depressed mood 10/30/2016  . Suicidal ideation 10/30/2016  . 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 Rash and nonspecific skin eruption 01/10/2014  . Otitis externa of both ears 01/10/2014  . Acute pharyngitis 06/07/2013  . Right groin pain 05/27/2012  . CONTACT DERMATITIS 12/12/2008  . ALLERGIC RHINITIS  01/11/2008  . ATTENTION DEFICIT, W/HYPERACTIVITY 06/03/2006    Past Surgical History:  Procedure Laterality Date  . INGUINAL HERNIA REPAIR  03/21/09   Repair of R inguinal hernia with hydrocele        Home Medications    Prior to Admission medications   Medication Sig Start Date End Date Taking? Authorizing Provider  HYDROcodone-acetaminophen (NORCO/VICODIN) 5-325 MG tablet Take 1-2 tablets by mouth every 6 (six) hours as needed. 10/04/17   Benjiman CorePickering, Nathan, MD  oxyCODONE (ROXICODONE) 5 MG immediate release tablet Take 1 tablet (5 mg total) by mouth every 6 (six) hours as needed for severe pain. 08/29/17   Jeanie SewerFawze, Mina A, PA-C    Family History History reviewed. No pertinent family history.  Social History Social History   Tobacco Use  . Smoking status: Current Every Day Smoker    Packs/day: 0.50    Types: Cigarettes  . Smokeless tobacco: Never Used  Substance Use Topics  . Alcohol use: Yes    Comment: OCCASIONAL  . Drug use: Yes    Frequency: 4.0 times per week    Types: Marijuana    Comment: marijuana---"4 blunts a day"     Allergies   Patient has no known allergies.   Review of Systems Review of Systems  Constitutional: Negative for chills and fever.  HENT: Positive for sore throat (off and on). Negative for congestion.   Respiratory: Positive for cough. Negative for shortness of breath and wheezing.  Cardiovascular: Negative.   Gastrointestinal: Negative.  Negative for abdominal pain and nausea.  Musculoskeletal:       See HPI.  Skin: Negative.  Negative for color change and wound.  Neurological: Negative.      Physical Exam Updated Vital Signs BP 135/84   Pulse 85   Temp 99 F (37.2 C) (Oral)   Resp 16   Wt 77.6 kg   SpO2 97%   BMI 23.85 kg/m   Physical Exam Vitals signs and nursing note reviewed.  Constitutional:      Appearance: He is well-developed.  Neck:     Musculoskeletal: Normal range of motion.  Pulmonary:     Effort:  Pulmonary effort is normal.     Breath sounds: Normal breath sounds. No wheezing, rhonchi or rales.  Musculoskeletal: Normal range of motion.     Comments: No swelling of the back noted. No midline tenderness of the lumbar spine. There is mild bilateral paralumbar tenderness. Te right hand is mildly swollen over the dorsal 5th MCP. No redness, bony deformity. He maintains FROM of the 5th finger. The area is tender.   Skin:    General: Skin is warm and dry.  Neurological:     Mental Status: He is alert and oriented to person, place, and time.     Sensory: Sensory deficit (Decreased sensation of dorsal 5th right finger. ) present.      ED Treatments / Results  Labs (all labs ordered are listed, but only abnormal results are displayed) Labs Reviewed - No data to display  EKG None  Radiology No results found.  Procedures Procedures (including critical care time)  Medications Ordered in ED Medications - No data to display   Initial Impression / Assessment and Plan / ED Course  I have reviewed the triage vital signs and the nursing notes.  Pertinent labs & imaging results that were available during my care of the patient were reviewed by me and considered in my medical decision making (see chart for details).     Discussed presenting symptoms of right hand pain and low back pain with the patient.   There is no new injury to the right hand. Chart reviewed. In May of last year he sustained a boxer's fracture to the right hand with slight angulation. Closed fracture at the time. Do not see a need to re-image without new injury. Will provide referral to orthopedics for further management. REcommend ibuprofen.  Feel the low back discomfort is musculoskeletal secondary to coughing. Will provide inhaler to help with control of cough, recommended smoking cessation. Lungs clear. No evidence of PNA.   Encouraged getting established with a primary care provider for routine care.   Final  Clinical Impressions(s) / ED Diagnoses   Final diagnoses:  None   1. Right hand pain 2. Low back pain  ED Discharge Orders    None       Elpidio Anis, PA-C 04/22/18 2329    Virgina Norfolk, DO 04/23/18 0128

## 2018-04-22 NOTE — ED Triage Notes (Signed)
Pt reports cough and sore throat x3 days. Pt also reports hand pain in right hand and states that he broke his hand about 6 months ago.

## 2018-04-22 NOTE — Discharge Instructions (Signed)
Take ibuprofen for pain as directed. Use inhaler for cough as needed. You are encouraged to find a primary care provider for routine medical care - a resource list of clinics is provided for you. You can see a primary care doctor or call orthopedics for further evaluation and management of hand pain.

## 2018-04-23 NOTE — ED Notes (Signed)
Pt declined to sign at discharge. He also states, "i'm okay" when asked to do discharge vitals. A&Ox4. Ambulatory,

## 2018-05-06 ENCOUNTER — Other Ambulatory Visit: Payer: Self-pay

## 2018-05-06 ENCOUNTER — Encounter (HOSPITAL_COMMUNITY): Payer: Self-pay | Admitting: Emergency Medicine

## 2018-05-06 ENCOUNTER — Ambulatory Visit (HOSPITAL_COMMUNITY)
Admission: EM | Admit: 2018-05-06 | Discharge: 2018-05-06 | Disposition: A | Discharge status: Home / Self Care | Payer: Medicaid Other | Attending: Internal Medicine | Admitting: Internal Medicine

## 2018-05-06 DIAGNOSIS — H6501 Acute serous otitis media, right ear: Secondary | ICD-10-CM

## 2018-05-06 MED ORDER — AMOXICILLIN 875 MG PO TABS: 875.0000 mg | ORAL_TABLET | Freq: Two times a day (BID) | ORAL | 0 refills | Status: AC

## 2018-05-06 NOTE — ED Triage Notes (Signed)
PT cleaned his ears with a Qtip this AM and the cotton end was left behind in right ear.

## 2019-02-03 ENCOUNTER — Other Ambulatory Visit: Payer: Self-pay

## 2019-02-03 ENCOUNTER — Ambulatory Visit (HOSPITAL_COMMUNITY)
Admission: EM | Admit: 2019-02-03 | Discharge: 2019-02-03 | Disposition: A | Discharge status: Home / Self Care | Payer: Medicaid Other | Attending: Family Medicine | Admitting: Family Medicine

## 2019-02-03 ENCOUNTER — Encounter (HOSPITAL_COMMUNITY): Payer: Self-pay

## 2019-02-03 DIAGNOSIS — H9201 Otalgia, right ear: Secondary | ICD-10-CM | POA: Insufficient documentation

## 2019-02-03 DIAGNOSIS — Z792 Long term (current) use of antibiotics: Secondary | ICD-10-CM | POA: Insufficient documentation

## 2019-02-03 DIAGNOSIS — Z915 Personal history of self-harm: Secondary | ICD-10-CM | POA: Diagnosis not present

## 2019-02-03 DIAGNOSIS — Z79899 Other long term (current) drug therapy: Secondary | ICD-10-CM | POA: Insufficient documentation

## 2019-02-03 DIAGNOSIS — Z20828 Contact with and (suspected) exposure to other viral communicable diseases: Secondary | ICD-10-CM | POA: Insufficient documentation

## 2019-02-03 DIAGNOSIS — F17210 Nicotine dependence, cigarettes, uncomplicated: Secondary | ICD-10-CM | POA: Insufficient documentation

## 2019-02-03 DIAGNOSIS — H60391 Other infective otitis externa, right ear: Secondary | ICD-10-CM | POA: Insufficient documentation

## 2019-02-03 MED ORDER — ACETAMINOPHEN 325 MG PO TABS
ORAL_TABLET | ORAL | Status: AC
  Filled 2019-02-03: qty 2

## 2019-02-03 MED ORDER — HYDROCODONE-ACETAMINOPHEN 5-325 MG PO TABS: 1.0000 | ORAL_TABLET | Freq: Four times a day (QID) | ORAL | 0 refills | Status: DC | PRN

## 2019-02-03 MED ORDER — ACETAMINOPHEN 325 MG PO TABS
650.0000 mg | ORAL_TABLET | Freq: Once | ORAL | Status: AC
  Administered 2019-02-03: 650 mg via ORAL

## 2019-02-03 MED ORDER — NEOMYCIN-POLYMYXIN-HC 3.5-10000-1 OT SUSP: 3.0000 [drp] | Freq: Four times a day (QID) | OTIC | 0 refills | Status: DC

## 2019-02-03 NOTE — ED Provider Notes (Signed)
[facility]    CSN: [id number] Arrival date & time: 02/03/19  1835      History   Chief Complaint Chief Complaint  Patient presents with  . Otalgia  . Chills  . Headache    HPI Jerry Johnson is a 28 y.o. male.   HPI  Is here for right ear pain.  He states is been painful for the last 3 to 4 days.  He states is becoming increasingly painful.  He cannot sleep on his side.  He cannot touch the ear.  He states he had an old prescription for amoxicillin.  He started it yesterday.  It is amoxicillin 875 twice a day.  He has been taking Tylenol for pain.  It helps somewhat.  Last night he was unable to sleep because of the ear pain.  He states his hearing is diminished in that ear.  Nose cough cold runny nose or sore throat.  He is found to have a fever here today  Past Medical History:  Diagnosis Date  . UNSPECIFIED HYDROCELE 03/12/2009   Qualifier: Diagnosis of  By: Burnadette Pop  MD, Trisha Mangle      Patient Active Problem List   Diagnosis Date Noted  . Adjustment disorder with mixed anxiety and depressed mood 10/30/2016  . Suicidal ideation 10/30/2016  . 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 Rash and nonspecific skin eruption 01/10/2014  . Otitis externa of both ears 01/10/2014  . Acute pharyngitis 06/07/2013  . Right groin pain 05/27/2012  . CONTACT DERMATITIS 12/12/2008  . ALLERGIC RHINITIS 01/11/2008  . ATTENTION DEFICIT, W/HYPERACTIVITY 06/03/2006    Past Surgical History:  Procedure Laterality Date  . INGUINAL HERNIA REPAIR  03/21/09   Repair of R inguinal hernia with hydrocele       Home Medications    Prior to Admission medications   Medication Sig Start Date End Date Taking? Authorizing Provider  amoxicillin (AMOXIL) 875 MG tablet Take 875 mg by mouth 2 (two) times daily.   Yes [provider]  HYDROcodone-acetaminophen (NORCO/VICODIN) 5-325 MG tablet Take 1-2 tablets by mouth  every 6 (six) hours as needed for severe pain. 02/03/19   Eustace Moore, MD  ibuprofen (ADVIL,MOTRIN) 600 MG tablet Take 1 tablet (600 mg total) by mouth every 6 (six) hours as needed. 04/22/18   Elpidio Anis, PA-C  neomycin-polymyxin-hydrocortisone (CORTISPORIN) 3.5-10000-1 OTIC suspension Place 3 drops into both ears 4 (four) times daily. 02/03/19   Eustace Moore, MD    Family History History reviewed. No pertinent family history.  Social History Social History   Tobacco Use  . Smoking status: Current Every Day Smoker    Packs/day: 0.50    Types: Cigarettes  . Smokeless tobacco: Never Used  Substance Use Topics  . Alcohol use: Yes    Comment: OCCASIONAL  . Drug use: Yes    Frequency: 4.0 times per week    Types: Marijuana    Comment: marijuana---"4 blunts a day"     Allergies   Patient has no known allergies.   Review of Systems Review of Systems  Constitutional: Positive for fever. Negative for chills.  HENT: Positive for ear pain and hearing loss. Negative for sore throat.   Eyes: Negative for pain and visual disturbance.  Respiratory: Negative for cough and shortness of breath.   Cardiovascular: Negative for chest pain and palpitations.  Gastrointestinal: Negative for abdominal pain and vomiting.  Genitourinary: Negative for dysuria and hematuria.  Musculoskeletal: Negative  for arthralgias and back pain.  Skin: Negative for color change and rash.  Neurological: Positive for headaches. Negative for seizures and syncope.  All other systems reviewed and are negative.    Physical Exam Triage Vital Signs ED Triage Vitals  Enc Vitals Group     BP 02/03/19 1857 (!) 144/78     Pulse Rate 02/03/19 1857 99     Resp 02/03/19 1857 17     Temp 02/03/19 1857 (!) 100.6 F (38.1 C)     Temp Source 02/03/19 1857 Oral     SpO2 02/03/19 1857 99 %     Weight --      Height --      Head Circumference --      Peak Flow --      Pain Score 02/03/19 1854 10      Pain Loc --      Pain Edu? --      Excl. in GC? --    No data found.  Updated Vital Signs BP (!) 144/78 (BP Location: Right Arm)   Pulse 99   Temp (!) 100.6 F (38.1 C) (Oral)   Resp 17   SpO2 99% :     Physical Exam Constitutional:      General: He is not in acute distress.    Appearance: He is well-developed.  HENT:     Head: Normocephalic and atraumatic.     Left Ear: Tympanic membrane, ear canal and external ear normal.     Ears:     Comments: Right ear has macerated ear canal that is swollen.  Erythematous.  Discharge.  Cannot see TM.  Pain with any traction of pinna    Nose: No congestion.     Mouth/Throat:     Mouth: Mucous membranes are moist.  Eyes:     Extraocular Movements: Extraocular movements intact.     Conjunctiva/sclera: Conjunctivae normal.     Pupils: Pupils are equal, round, and reactive to light.  Neck:     Musculoskeletal: Normal range of motion.  Cardiovascular:     Rate and Rhythm: Normal rate and regular rhythm.     Heart sounds: Normal heart sounds.  Pulmonary:     Effort: Pulmonary effort is normal. No respiratory distress.     Breath sounds: Normal breath sounds.  Abdominal:     General: There is no distension.     Palpations: Abdomen is soft.  Musculoskeletal: Normal range of motion.  Lymphadenopathy:     Cervical: Cervical adenopathy present.  Skin:    General: Skin is warm and dry.  Neurological:     Mental Status: He is alert.      UC Treatments / Results  Labs (all labs ordered are listed, but only abnormal results are displayed) Labs Reviewed  NOVEL CORONAVIRUS, NAA (HOSP ORDER, SEND-OUT TO REF LAB; TAT 18-24 HRS)    EKG   Radiology No results found.  Procedures Procedures (including critical care time)  Medications Ordered in UC Medications  acetaminophen (TYLENOL) tablet 650 mg (650 mg Oral Given 02/03/19 1911)  acetaminophen (TYLENOL) 325 MG tablet (has no administration in time range)    Initial Impression  / Assessment and Plan / UC Course  I have reviewed the triage vital signs and the nursing notes.  Pertinent labs & imaging results that were available during my care of the patient were reviewed by me and considered in my medical decision making (see chart for details).     Treat the ear  infection with antibiotics and eardrops.  I am giving him a couple of Vicodin for pain.  He is advised not to take these and drive.  He is also tested for coronavirus.  Is advised to quarantine until his test result is available. Final Clinical Impressions(s) / UC Diagnoses   Final diagnoses:  Ear pain, right  Other infective acute otitis externa of right ear     Discharge Instructions     Continue amoxicillin 2 times a day Use the eardrops 3-4 times a day Take Tylenol or ibuprofen for moderate pain Take hydrocodone for severe pain.  Do not take hydrocodone and drive.  It can cause drowsiness. Call or return if not improving by Monday   ED Prescriptions    Medication Sig Dispense Auth. Provider   neomycin-polymyxin-hydrocortisone (CORTISPORIN) 3.5-10000-1 OTIC suspension Place 3 drops into both ears 4 (four) times daily. 10 mL Raylene Everts, MD   HYDROcodone-acetaminophen (NORCO/VICODIN) 5-325 MG tablet Take 1-2 tablets by mouth every 6 (six) hours as needed for severe pain. 10 tablet Raylene Everts, MD     I have reviewed the PDMP during this encounter.   Raylene Everts, MD 02/03/19 2001

## 2019-02-03 NOTE — ED Triage Notes (Addendum)
Pt states he is having right ear pain x 4 days. Pt states having chills and headache x 3 days. Pt has being taking amoxicillin 875 mg x 2 days.

## 2019-02-03 NOTE — Discharge Instructions (Addendum)
Continue amoxicillin 2 times a day Use the eardrops 3-4 times a day Take Tylenol or ibuprofen for moderate pain Take hydrocodone for severe pain.  Do not take hydrocodone and drive.  It can cause drowsiness. Call or return if not improving by Monday

## 2019-02-05 LAB — NOVEL CORONAVIRUS, NAA (HOSP ORDER, SEND-OUT TO REF LAB; TAT 18-24 HRS): SARS-CoV-2, NAA: NOT DETECTED

## 2019-10-06 ENCOUNTER — Emergency Department (HOSPITAL_COMMUNITY)
Admission: EM | Admit: 2019-10-06 | Discharge: 2019-10-06 | Disposition: A | Discharge status: Home / Self Care | Payer: Medicaid Other | Attending: Emergency Medicine | Admitting: Emergency Medicine

## 2019-10-06 ENCOUNTER — Other Ambulatory Visit: Payer: Self-pay

## 2019-10-06 DIAGNOSIS — F1210 Cannabis abuse, uncomplicated: Secondary | ICD-10-CM | POA: Insufficient documentation

## 2019-10-06 DIAGNOSIS — R4689 Other symptoms and signs involving appearance and behavior: Secondary | ICD-10-CM

## 2019-10-06 DIAGNOSIS — R456 Violent behavior: Secondary | ICD-10-CM | POA: Insufficient documentation

## 2019-10-06 DIAGNOSIS — F332 Major depressive disorder, recurrent severe without psychotic features: Secondary | ICD-10-CM | POA: Insufficient documentation

## 2019-10-06 DIAGNOSIS — F4323 Adjustment disorder with mixed anxiety and depressed mood: Secondary | ICD-10-CM | POA: Insufficient documentation

## 2019-10-06 DIAGNOSIS — F17210 Nicotine dependence, cigarettes, uncomplicated: Secondary | ICD-10-CM | POA: Insufficient documentation

## 2019-10-06 LAB — COMPREHENSIVE METABOLIC PANEL
ALT: 22 U/L (ref 0–44)
AST: 25 U/L (ref 15–41)
Albumin: 4.6 g/dL (ref 3.5–5.0)
Alkaline Phosphatase: 77 U/L (ref 38–126)
Anion gap: 8 (ref 5–15)
BUN: 14 mg/dL (ref 6–20)
CO2: 26 mmol/L (ref 22–32)
Calcium: 9.6 mg/dL (ref 8.9–10.3)
Chloride: 105 mmol/L (ref 98–111)
Creatinine, Ser: 0.78 mg/dL (ref 0.61–1.24)
GFR calc Af Amer: >60 mL/min (ref >60)
GFR calc non Af Amer: >60 mL/min (ref >60)
Glucose, Bld: 112 mg/dL — ABNORMAL HIGH (ref 70–99)
Potassium: 3.7 mmol/L (ref 3.5–5.1)
Sodium: 139 mmol/L (ref 135–145)
Total Bilirubin: 1.1 mg/dL (ref 0.3–1.2)
Total Protein: 8.3 g/dL — ABNORMAL HIGH (ref 6.5–8.1)

## 2019-10-06 LAB — CBC WITH DIFFERENTIAL/PLATELET
Abs Immature Granulocytes: 0.03 10*3/uL (ref 0.00–0.07)
Basophils Absolute: 0 10*3/uL (ref 0.0–0.1)
Basophils Relative: 0 %
Eosinophils Absolute: 0.1 10*3/uL (ref 0.0–0.5)
Eosinophils Relative: 1 %
HCT: 40.6 % (ref 39.0–52.0)
Hemoglobin: 13 g/dL (ref 13.0–17.0)
Immature Granulocytes: 0 %
Lymphocytes Relative: 10 %
Lymphs Abs: 1 10*3/uL (ref 0.7–4.0)
MCH: 25.3 pg — ABNORMAL LOW (ref 26.0–34.0)
MCHC: 32 g/dL (ref 30.0–36.0)
MCV: 79 fL — ABNORMAL LOW (ref 80.0–100.0)
Monocytes Absolute: 0.6 10*3/uL (ref 0.1–1.0)
Monocytes Relative: 6 %
Neutro Abs: 8.1 10*3/uL — ABNORMAL HIGH (ref 1.7–7.7)
Neutrophils Relative %: 83 %
Platelets: 220 10*3/uL (ref 150–400)
RBC: 5.14 MIL/uL (ref 4.22–5.81)
RDW: 14.7 % (ref 11.5–15.5)
WBC: 9.8 10*3/uL (ref 4.0–10.5)
nRBC: 0 % (ref 0.0–0.2)

## 2019-10-06 LAB — RAPID URINE DRUG SCREEN, HOSP PERFORMED
Amphetamines: NOT DETECTED
Barbiturates: NOT DETECTED
Benzodiazepines: NOT DETECTED
Cocaine: NOT DETECTED
Opiates: NOT DETECTED
Tetrahydrocannabinol: POSITIVE — AB

## 2019-10-06 LAB — ETHANOL: Alcohol, Ethyl (B): <10 mg/dL (ref <10)

## 2019-10-06 MED ORDER — ZIPRASIDONE MESYLATE 20 MG IM SOLR: 20.0000 mg | INTRAMUSCULAR | Status: DC | PRN

## 2019-10-06 MED ORDER — OLANZAPINE 10 MG PO TBDP: 10.0000 mg | ORAL_TABLET | Freq: Three times a day (TID) | ORAL | Status: DC | PRN

## 2019-10-06 MED ORDER — LORAZEPAM 1 MG PO TABS: 1.0000 mg | ORAL_TABLET | ORAL | Status: DC | PRN

## 2019-10-06 NOTE — Discharge Instructions (Addendum)
Follow-up with your primary doctor.  If you have any thoughts of hurting herself, hurting other people, see or hear things other people do not see her ear, return to ER for reassessment.

## 2019-10-06 NOTE — ED Notes (Signed)
Call several times for TTS to evaluate pt.

## 2019-10-06 NOTE — ED Provider Notes (Signed)
[facility]-EMER[facility]PT Provider Note   CSN: [id number] Arrival date & time: 10/06/19  1516     History Chief Complaint  Patient presents with  . Mental Health Problem    Jerry Johnson is a 29 y.o. male. Presented to ER with [employer]. They were called for altercation in home. Recently got a cat. Patient reports upset with aunt that she tried to remove cat after she told him he was not allowed to have one. Police report that he did not try to hurt anyone or hurt himself but was quite agitated and at one point made a suicidal threat if he was not able to get the cat back. Patient reports to me he has no thoughts of hurting himself or or hurting others. Has not had any suicidal ideation, no suicidal plans. Denies AVH.   HPI     Past Medical History:  Diagnosis Date  . UNSPECIFIED HYDROCELE 03/12/2009   Qualifier: Diagnosis of  By: Burnadette Pop  MD, Trisha Mangle      Patient Active Problem List   Diagnosis Date Noted  . Adjustment disorder with mixed anxiety and depressed mood 10/30/2016  . Suicidal ideation 10/30/2016  . 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 Rash and nonspecific skin eruption 01/10/2014  . Otitis externa of both ears 01/10/2014  . Acute pharyngitis 06/07/2013  . Right groin pain 05/27/2012  . CONTACT DERMATITIS 12/12/2008  . ALLERGIC RHINITIS 01/11/2008  . ATTENTION DEFICIT, W/HYPERACTIVITY 06/03/2006    Past Surgical History:  Procedure Laterality Date  . INGUINAL HERNIA REPAIR  03/21/09   Repair of R inguinal hernia with hydrocele       No family history on file.  Social History   Tobacco Use  . Smoking status: Current Every Day Smoker    Packs/day: 0.50    Types: Cigarettes  . Smokeless tobacco: Never Used  Vaping Use  . Vaping Use: Never used  Substance Use Topics  . Alcohol use: Yes    Comment: OCCASIONAL  . Drug use: Yes    Frequency: 4.0 times per week    Types:  Marijuana    Comment: marijuana---"4 blunts a day"    Home Medications Prior to Admission medications   Medication Sig Start Date End Date Taking? Authorizing Provider  amoxicillin (AMOXIL) 875 MG tablet Take 875 mg by mouth 2 (two) times daily.    [provider]  HYDROcodone-acetaminophen (NORCO/VICODIN) 5-325 MG tablet Take 1-2 tablets by mouth every 6 (six) hours as needed for severe pain. 02/03/19   Eustace Moore, MD  ibuprofen (ADVIL,MOTRIN) 600 MG tablet Take 1 tablet (600 mg total) by mouth every 6 (six) hours as needed. 04/22/18   Elpidio Anis, PA-C  neomycin-polymyxin-hydrocortisone (CORTISPORIN) 3.5-10000-1 OTIC suspension Place 3 drops into both ears 4 (four) times daily. 02/03/19   Eustace Moore, MD    Allergies    Patient has no known allergies.  Review of Systems   Review of Systems  Constitutional: Negative for chills and fever.  HENT: Negative for ear pain and sore throat.   Eyes: Negative for pain and visual disturbance.  Respiratory: Negative for cough and shortness of breath.   Cardiovascular: Negative for chest pain and palpitations.  Gastrointestinal: Negative for abdominal pain and vomiting.  Genitourinary: Negative for dysuria and hematuria.  Musculoskeletal: Negative for arthralgias and back pain.  Skin: Negative for color change and rash.  Neurological: Negative for seizures and syncope.  All other systems reviewed  and are negative.   Physical Exam Updated Vital Signs BP (!) 147/86 (BP Location: Left Arm)   Pulse 67   Temp 98.3 F (36.8 C) (Oral)   Resp 18   SpO2 96%   Physical Exam Vitals and nursing note reviewed.  Constitutional:      Appearance: He is well-developed.  HENT:     Head: Normocephalic and atraumatic.  Eyes:     Conjunctiva/sclera: Conjunctivae normal.  Cardiovascular:     Rate and Rhythm: Normal rate and regular rhythm.     Heart sounds: No murmur heard.   Pulmonary:     Effort: Pulmonary effort is  normal. No respiratory distress.     Breath sounds: Normal breath sounds.  Abdominal:     Palpations: Abdomen is soft.     Tenderness: There is no abdominal tenderness.  Musculoskeletal:        General: No deformity or signs of injury.     Cervical back: Neck supple.  Skin:    General: Skin is warm and dry.     Capillary Refill: Capillary refill takes less than 2 seconds.  Neurological:     General: No focal deficit present.     Mental Status: He is alert and oriented to person, place, and time.  Psychiatric:     Comments: Somewhat agitated and upset, not responding to internal stimuli, no suicidal or homicidal ideation     ED Results / Procedures / Treatments   Labs (all labs ordered are listed, but only abnormal results are displayed) Labs Reviewed  COMPREHENSIVE METABOLIC PANEL - Abnormal; Notable for the following components:      Result Value   Glucose, Bld 112 (*)    Total Protein 8.3 (*)    All other components within normal limits  RAPID URINE DRUG SCREEN, HOSP PERFORMED - Abnormal; Notable for the following components:   Tetrahydrocannabinol POSITIVE (*)    All other components within normal limits  CBC WITH DIFFERENTIAL/PLATELET - Abnormal; Notable for the following components:   MCV 79.0 (*)    MCH 25.3 (*)    Neutro Abs 8.1 (*)    All other components within normal limits  ETHANOL    EKG None  Radiology No results found.  Procedures Procedures (including critical care time)  Medications Ordered in ED Medications - No data to display  ED Course  I have reviewed the triage vital signs and the nursing notes.  Pertinent labs & imaging results that were available during my care of the patient were reviewed by me and considered in my medical decision making (see chart for details).    MDM Rules/Calculators/A&P                          29 y/o male came to ER with [employer] after he made suicidal threat to [employer]. Patient was somewhat agitated, seemed very  frustrated over the cat situation but did not exhibit any SI or HI while in ER. Not responding to internal stimuli, no AVH. I recommended getting a TTS eval which patient was agreeable to. TTS evaluated, they obtained additional collateral from family members. They felt patient was appropriate for discharge and had safe discharge plan with other family. I agree with their assessment and discharged patient home.     After the discussed management above, the patient was determined to be safe for discharge.  The patient was in agreement with this plan and all questions regarding their care were  answered.  ED return precautions were discussed and the patient will return to the ED with any significant worsening of condition.   Final Clinical Impression(s) / ED Diagnoses Final diagnoses:  Aggressive behavior    Rx / DC Orders ED Discharge Orders    None       Milagros Loll, MD 10/07/19 1428

## 2019-10-06 NOTE — BH Assessment (Signed)
Comprehensive Clinical Assessment (CCA) Screening, Triage and Referral Note  10/06/2019 Jerry Johnson [id number] -Pt was brought to [facility] by [employer].  He reportedly was in the road, agitated, talking about killing himself.    Pt is agitated and talking rapidly.  He has fair eye contact and is oriented.  Pt is not responding to internal stimuli and is not engaging in delusional behavior.  Pt thought process is scattered at times but he is coherent for the most part.    Pt says that he was not in the street today trying to kill himself.  He says it was a side street anyway.  He says that he did not say to anyone that he wanted to kill himself.  Pt currently denies any SI, plan or intention. Pt had a plan and intention in 2018 but no attempt.  Patient is denying any plan or intention to harm anyone else.  He denies any A/V hallucinations.  Patient also denies use of THC but is positive for it in UDS.  Patient stays with his mother.  He got the cat 3 days ago and is very attached to it.  Patient says he told his mother that he wanted to leave her home and that he would take the cat with him.  Patient says his mother must have gotten animal control to pick up the cat while he was at work.  When he found out he was very upset.  Pt still is upset and feels his mother does "not have my best interests at heart."  Patient says he can stay with his brother tonight.  Patient says however that he needs to get some things from his mother's house and that "she and my other brother and sister are evil people and I don't trust them."  Patient was seen by Nira Conn, FNP who obtained permission for [place] to contact patient's aunt and his brother.  Clinician call Giankarlo Leamer [phone] but was only able to leave a HIPPA compliant voicemail.  Clinician did talk to aunt, Arnette Norris [phone].  She confirmed that she had spoken to patient earlier to day and he never made any comment about wanting to harm  himself or anyone else.  She had no reservations about patient being sent home.  Pt says that he can stay with his brother or with his uncle tonight and not interact with other family members.    Patient has no current outpatient care.  He was at [facility] in 10/2016 after a romantic breakup.    -Clinician discussed patient care with Nicolette Bang and discussed collateral information from aunt.  Barbara Cower said that patient appears appropriate for discharge. Clinician spoke with Dr. Marianna Fuss.  Informed him that Nira Conn, FNP had seen patient and that collateral contact had been made.  Dr. Stevie Kern was fine with patient being discharged home.   Visit Diagnosis: No diagnosis found.  Patient Reported Information How did you hear about [place]? Other (Comment)   Referral name: Police brought patient to [facility].   Referral phone number: No data recorded Whom do you see for routine medical problems? I don't have a doctor   Practice/Facility Name: No data recorded  Practice/Facility Phone Number: No data recorded  Name of Contact: No data recorded  Contact Number: No data recorded  Contact Fax Number: No data recorded  Prescriber Name: No data recorded  Prescriber Address (if known): No data recorded What Is the Reason for Your Visit/Call Today? [employer] found patient  near the street close to his mother's home.  How Long Has This Been Causing You Problems? <Week  Have You Recently Been in Any Inpatient Treatment (Hospital/Detox/Crisis Center/28-Day Program)? No   Name/Location of Program/Hospital:No data recorded  How Long Were You There? No data recorded  When Were You Discharged? No data recorded Have You Ever Received Services From [facility] Before? Yes   Who Do You See at [facility] Was at [facility] in 10/2016  Have You Recently Had Any Thoughts About Hurting Yourself? No   Are You Planning to Commit Suicide/Harm Yourself At This time?  No  Have you Recently Had Thoughts About Hurting Someone  [name]? No   Explanation: No data recorded Have You Used Any Alcohol or Drugs in the Past 24 Hours? No (UDS + for marijuana)   How Long Ago Did You Use Drugs or Alcohol?  No data recorded  What Did You Use and How Much? No data recorded What Do You Feel Would Help You the Most Today? Assessment Only  Do You Currently Have a Therapist/Psychiatrist? No   Name of Therapist/Psychiatrist: No data recorded  Have You Been Recently Discharged From Any Office Practice or Programs? No   Explanation of Discharge From Practice/Program:  No data recorded    CCA Screening Triage Referral Assessment Type of Contact: Tele-Assessment   Is this Initial or Reassessment? Initial Assessment   Date Telepsych consult ordered in [facility]:  10/06/19   Time Telepsych consult ordered in [facility]:  1552  Patient Reported Information Reviewed? Yes   Patient Left Without Being Seen? No data recorded  Reason for Not Completing Assessment: No data recorded Collateral Involvement: No data recorded Does Patient Have a Court Appointed Legal Guardian? No data recorded  Name and Contact of Legal Guardian:  No data recorded If Minor and Not Living with Parent(s), Who has Custody? No data recorded Is CPS involved or ever been involved? No data recorded Is APS involved or ever been involved? No data recorded Patient Determined To Be At Risk for Harm To Self or Others Based on Review of Patient Reported Information or Presenting Complaint? No   Method: No data recorded  Availability of Means: No data recorded  Intent: No data recorded  Notification Required: No data recorded  Additional Information for Danger to Others Potential:  No data recorded  Additional Comments for Danger to Others Potential:  No data recorded  Are There Guns or Other Weapons in Your Home?  No data recorded   Types of Guns/Weapons: No data recorded   Are These Weapons Safely Secured?                              No data recorded   Who Could Verify  You Are Able To Have These Secured:    No data recorded Do You Have any Outstanding Charges, Pending Court Dates, Parole/Probation? No data recorded Contacted To Inform of Risk of Harm To Self or Others: No data recorded Location of Assessment: [facility]  Does Patient Present under Involuntary Commitment? No   IVC Papers Initial File Date: No data recorded  [place] of Residence: [place]  Patient Currently Receiving the Following Services: Not Receiving Services   Determination of Need: Emergent (2 hours)   Options For Referral: Therapeutic Triage Services   Beatriz Stallion Ray, LCAS

## 2019-10-06 NOTE — ED Triage Notes (Signed)
Pt brought to [facility] by [employer]. Pt was agitated, in the road, talking about killing self. Pt uncooperative.

## 2019-10-06 NOTE — Consult Note (Signed)
Telepsych Consultation   Reason for Consult:  Agitation Referring Physician:  ED provider Location of Patient: [facility] Location of Provider: Behavioral Health TTS Department  Patient Identification: JENNER ROSIER MRN:  [id number] Principal Diagnosis: Adjustment disorder with mixed anxiety and depressed mood Diagnosis:  Principal Problem:   Adjustment disorder with mixed anxiety and depressed mood   Total Time spent with patient: 30 minutes  Subjective:   CHEO SELVEY is a 29 y.o. male who presented to the ED due to anxiety and agitation.  HPI:  Patient reports that his ex-girlfriend gave him a kitten 3 days ago. States that he feels close to the kitten because the cat's parents belong to his ex-girlfriend. States that his mother got upset because she didn't want  A cat in the house. States that his mother contacted animal control. States that when he found out he became angry and went and talked to a Field seismologist in the neighborhood and that the deputy helped him locate which animal shelter the cat is at. Adamantly denies that he is suicidal or homicidal. Per notes, he was agitated when he initially presented to the ED. On evaluation, he is pleasant, and cooperative. His speech is slightly pressured, but clear and coherent. Descriptions of association are intact. Denies any auditory and visual hallucinations. No indication he is responding  to internal stimuli. No evidence of delusional thought content.   TTS contacted the patient's Burnis Medin [phone].  She confirmed that she had spoken to patient earlier to day and he never made any comment about wanting to harm himself or anyone else.  She had no reservations about patient being sent home.  TTS Assessment:  Pt is agitated and talking rapidly.  He has fair eye contact and is oriented.  Pt is not responding to internal stimuli and is not engaging in delusional behavior.  Pt thought process is scattered at times  but he is coherent for the most part.    Pt says that he was not in the street today trying to kill himself.  He says it was a side street anyway.  He says that he did not say to anyone that he wanted to kill himself.  Pt currently denies any SI, plan or intention. Pt had a plan and intention in 2018 but no attempt.  Patient is denying any plan or intention to harm anyone else.  He denies any A/V hallucinations.  Patient also denies use of THC but is positive for it in UDS.  Patient stays with his mother.  He got the cat 3 days ago and is very attached to it.  Patient says he told his mother that he wanted to leave her home and that he would take the cat with him.  Patient says his mother must have gotten animal control to pick up the cat while he was at work.  When he found out he was very upset.  Pt still is upset and feels his mother does "not have my best interests at heart."  Patient says he can stay with his brother tonight.  Patient says however that he needs to get some things from his mother's house and that "she and my other brother and sister are evil people and I don't trust them."  Patient was seen by Nira Conn, FNP who obtained permission for [place] to contact patient's aunt and his brother.  Clinician call Kalev Temme [phone] but was only able to leave a HIPPA compliant voicemail.  Clinician did talk to aunt, Arnette Norris [phone].  She confirmed that she had spoken to patient earlier to day and he never made any comment about wanting to harm himself or anyone else.  She had no reservations about patient being sent home.  Pt says that he can stay with his brother or with his uncle tonight and not interact with other family members.    Patient has no current outpatient care.  He was at [facility] in 10/2016 after a romantic breakup.    -Clinician discussed patient care with Nicolette Bang and discussed collateral information from aunt.  Barbara Cower said that patient appears  appropriate for discharge. Clinician spoke with Dr. Marianna Fuss.  Informed him that Nira Conn, FNP had seen patient and that collateral contact had been made.  Dr. Stevie Kern was fine with patient being discharged home.   Past Psychiatric History: Adjustment Disorder with mixed emotions. He was inpatient at [facility] 10/30/2016-10/31/2016.  Risk to Self:  No Risk to Others:  No Prior Inpatient Therapy:  Yes Prior Outpatient Therapy:    Past Medical History:  Past Medical History:  Diagnosis Date  . UNSPECIFIED HYDROCELE 03/12/2009   Qualifier: Diagnosis of  By: Burnadette Pop  MD, Trisha Mangle      Past Surgical History:  Procedure Laterality Date  . INGUINAL HERNIA REPAIR  03/21/09   Repair of R inguinal hernia with hydrocele   Family History: No family history on file. Family Psychiatric  History: Unknown Social History:  Social History   Substance and Sexual Activity  Alcohol Use Yes   Comment: OCCASIONAL     Social History   Substance and Sexual Activity  Drug Use Yes  . Frequency: 4.0 times per week  . Types: Marijuana   Comment: marijuana---"4 blunts a day"    Social History   Socioeconomic History  . Marital status: Single    Spouse name: Not on file  . Number of children: Not on file  . Years of education: Not on file  . Highest education level: Not on file  Occupational History  . Not on file  Tobacco Use  . Smoking status: Current Every Day Smoker    Packs/day: 0.50    Types: Cigarettes  . Smokeless tobacco: Never Used  Vaping Use  . Vaping Use: Never used  Substance and Sexual Activity  . Alcohol use: Yes    Comment: OCCASIONAL  . Drug use: Yes    Frequency: 4.0 times per week    Types: Marijuana    Comment: marijuana---"4 blunts a day"  . Sexual activity: Not on file  Other Topics Concern  . Not on file  Social History Narrative  . Not on file   Social Determinants of Health   Financial Resource Strain:   . Difficulty of Paying Living Expenses:    Food Insecurity:   . Worried About Programme researcher, broadcasting/film/video in the Last Year:   . Barista in the Last Year:   Transportation Needs:   . Freight forwarder (Medical):   Marland Kitchen Lack of Transportation (Non-Medical):   Physical Activity:   . Days of Exercise per Week:   . Minutes of Exercise per Session:   Stress:   . Feeling of Stress :   Social Connections:   . Frequency of Communication with Friends and Family:   . Frequency of Social Gatherings with Friends and Family:   . Attends Religious Services:   . Active Member of Clubs or Organizations:   .  Attends Banker Meetings:   Marland Kitchen Marital Status:    Additional Social History:    Allergies:  No Known Allergies  Labs:  Results for orders placed or performed during the hospital encounter of 10/06/19 (from the past 48 hour(s))  Urine rapid drug screen (hosp performed)     Status: Abnormal   Collection Time: 10/06/19  3:53 PM  Result Value Ref Range   Opiates NONE DETECTED NONE DETECTED   Cocaine NONE DETECTED NONE DETECTED   Benzodiazepines NONE DETECTED NONE DETECTED   Amphetamines NONE DETECTED NONE DETECTED   Tetrahydrocannabinol POSITIVE (A) NONE DETECTED   Barbiturates NONE DETECTED NONE DETECTED    Comment: (NOTE) DRUG SCREEN FOR MEDICAL PURPOSES ONLY.  IF CONFIRMATION IS NEEDED FOR ANY PURPOSE, NOTIFY LAB WITHIN 5 DAYS.  LOWEST DETECTABLE LIMITS FOR URINE DRUG SCREEN Drug Class                     Cutoff (ng/mL) Amphetamine and metabolites    1000 Barbiturate and metabolites    200 Benzodiazepine                 200 Tricyclics and metabolites     300 Opiates and metabolites        300 Cocaine and metabolites        300 THC                            50 Performed at [facility], [address] [place], [place] [address]   Comprehensive metabolic panel     Status: Abnormal   Collection Time: 10/06/19  4:22 PM  Result Value Ref Range   Sodium 139 135 - 145 mmol/L   Potassium  3.7 3.5 - 5.1 mmol/L   Chloride 105 98 - 111 mmol/L   CO2 26 22 - 32 mmol/L   Glucose, Bld 112 (H) 70 - 99 mg/dL    Comment: Glucose reference range applies only to samples taken after fasting for at least 8 hours.   BUN 14 6 - 20 mg/dL   Creatinine, Ser 8.52 0.61 - 1.24 mg/dL   Calcium 9.6 8.9 - 77.8 mg/dL   Total Protein 8.3 (H) 6.5 - 8.1 g/dL   Albumin 4.6 3.5 - 5.0 g/dL   AST 25 15 - 41 U/L   ALT 22 0 - 44 U/L   Alkaline Phosphatase 77 38 - 126 U/L   Total Bilirubin 1.1 0.3 - 1.2 mg/dL   GFR calc non Af Amer >60 >60 mL/min   GFR calc Af Amer >60 >60 mL/min   Anion gap 8 5 - 15    Comment: Performed at [facility], [address] [place], [place] [address]  Ethanol     Status: None   Collection Time: 10/06/19  4:22 PM  Result Value Ref Range   Alcohol, Ethyl (B) <10 <10 mg/dL    Comment: (NOTE) Lowest detectable limit for serum alcohol is 10 mg/dL.  For medical purposes only. Performed at [facility], [address], [place], [place] [address]   CBC with Diff     Status: Abnormal   Collection Time: 10/06/19  4:22 PM  Result Value Ref Range   WBC 9.8 4.0 - 10.5 K/uL    Comment: WHITE COUNT CONFIRMED ON SMEAR   RBC 5.14 4.22 - 5.81 MIL/uL   Hemoglobin 13.0 13.0 - 17.0 g/dL   HCT  40.6 39 - 52 %   MCV 79.0 (L) 80.0 - 100.0 fL   MCH 25.3 (L) 26.0 - 34.0 pg   MCHC 32.0 30.0 - 36.0 g/dL   RDW 16.114.7 09.611.5 - 04.515.5 %   Platelets 220 150 - 400 K/uL   nRBC 0.0 0.0 - 0.2 %   Neutrophils Relative % 83 %   Neutro Abs 8.1 (H) 1.7 - 7.7 K/uL   Lymphocytes Relative 10 %   Lymphs Abs 1.0 0.7 - 4.0 K/uL   Monocytes Relative 6 %   Monocytes Absolute 0.6 0 - 1 K/uL   Eosinophils Relative 1 %   Eosinophils Absolute 0.1 0 - 0 K/uL   Basophils Relative 0 %   Basophils Absolute 0.0 0 - 0 K/uL   Immature Granulocytes 0 %   Abs Immature Granulocytes 0.03 0.00 - 0.07 K/uL    Comment: Performed at [facility], [address]  [place], [place] [id number]    Medications:  Current Facility-Administered Medications  Medication Dose Route Frequency Provider Last Rate Last Admin  . OLANZapine zydis (ZYPREXA) disintegrating tablet 10 mg  10 mg Oral Q8H PRN Milagros Lollykstra, Richard S, MD       And  . LORazepam (ATIVAN) tablet 1 mg  1 mg Oral PRN Milagros Lollykstra, Richard S, MD       And  . ziprasidone (GEODON) injection 20 mg  20 mg Intramuscular PRN Milagros Lollykstra, Richard S, MD       Current Outpatient Medications  Medication Sig Dispense Refill  . amoxicillin (AMOXIL) 875 MG tablet Take 875 mg by mouth 2 (two) times daily.    Marland Kitchen. HYDROcodone-acetaminophen (NORCO/VICODIN) 5-325 MG tablet Take 1-2 tablets by mouth every 6 (six) hours as needed for severe pain. 10 tablet 0  . ibuprofen (ADVIL,MOTRIN) 600 MG tablet Take 1 tablet (600 mg total) by mouth every 6 (six) hours as needed. 30 tablet 0  . neomycin-polymyxin-hydrocortisone (CORTISPORIN) 3.5-10000-1 OTIC suspension Place 3 drops into both ears 4 (four) times daily. 10 mL 0    Musculoskeletal:   Psychiatric Specialty Exam: Physical Exam Constitutional:      General: He is not in acute distress.    Appearance: He is not ill-appearing or toxic-appearing.  Neurological:     Mental Status: He is alert and oriented to person, place, and time.  Psychiatric:        Mood and Affect: Mood is anxious.        Behavior: Behavior is cooperative.        Thought Content: Thought content is not paranoid or delusional. Thought content does not include homicidal or suicidal ideation.     Review of Systems  Constitutional: Negative for activity change, appetite change, chills, diaphoresis, fatigue, fever and unexpected weight change.  Respiratory: Negative for cough and shortness of breath.   Psychiatric/Behavioral: Positive for dysphoric mood. Negative for hallucinations, self-injury, sleep disturbance and suicidal ideas. The patient is nervous/anxious. The patient is not hyperactive.     Blood  pressure (!) 147/86, pulse 67, temperature 98.3 F (36.8 C), temperature source Oral, resp. rate 18, SpO2 96 %.There is no height or weight on file to calculate BMI.  General Appearance: Casual  Eye Contact:  Good  Speech:  Clear and Coherent and Slightly pressured  Volume:  Normal  Mood:  Anxious  Affect:  Congruent  Thought Process:  Coherent, Goal Directed, Linear and Descriptions of Associations: Intact  Orientation:  Full (Time, Place, and Person)  Thought Content:  Logical  Suicidal Thoughts:  No  Homicidal Thoughts:  No  Memory:  Immediate;   Good Recent;   Good  Judgement:  Fair  Insight:  Fair  Psychomotor Activity:  Restlessness  Concentration:  Concentration: Fair and Attention Span: Fair  Recall:  Good  Fund of Knowledge:  Fair  Language:  Good  Akathisia:  Negative    AIMS (if indicated):     Assets:  Communication Skills Desire for Improvement Leisure Time Physical Health  ADL's:  Intact  Cognition:  WNL  Sleep:       Disposition: No evidence of imminent risk to self or others at present.   Patient does not meet criteria for psychiatric inpatient admission. Supportive therapy provided about ongoing stressors. Discussed crisis plan, support from social network, calling 911, coming to the Emergency Department, and calling Suicide Hotline.  This service was provided via telemedicine using a 2-way, interactive audio and video technology.  Names of all persons participating in this telemedicine service and their role in this encounter. Name: Nira Conn Role: PMHNP  Name: Loa Socks Role: Patient  Name:  Role:   Name:  Role:     Jackelyn Poling, NP 10/06/2019 11:18 PM

## 2019-11-21 ENCOUNTER — Other Ambulatory Visit: Payer: Medicaid Other

## 2019-11-21 ENCOUNTER — Other Ambulatory Visit: Payer: Self-pay

## 2019-11-21 DIAGNOSIS — Z20822 Contact with and (suspected) exposure to covid-19: Secondary | ICD-10-CM

## 2019-11-22 LAB — SARS-COV-2, NAA 2 DAY TAT

## 2019-11-22 LAB — NOVEL CORONAVIRUS, NAA: SARS-CoV-2, NAA: NOT DETECTED

## 2020-04-02 ENCOUNTER — Other Ambulatory Visit: Payer: Self-pay

## 2020-04-02 ENCOUNTER — Ambulatory Visit (HOSPITAL_COMMUNITY)
Admission: EM | Admit: 2020-04-02 | Discharge: 2020-04-02 | Disposition: A | Discharge status: Home / Self Care | Payer: HRSA Program | Attending: Family Medicine | Admitting: Family Medicine

## 2020-04-02 ENCOUNTER — Encounter (HOSPITAL_COMMUNITY): Payer: Self-pay | Admitting: Emergency Medicine

## 2020-04-02 DIAGNOSIS — J069 Acute upper respiratory infection, unspecified: Secondary | ICD-10-CM | POA: Insufficient documentation

## 2020-04-02 DIAGNOSIS — Z20822 Contact with and (suspected) exposure to covid-19: Secondary | ICD-10-CM | POA: Insufficient documentation

## 2020-04-02 LAB — SARS CORONAVIRUS 2 (TAT 6-24 HRS): SARS Coronavirus 2: NEGATIVE

## 2020-04-02 NOTE — Discharge Instructions (Addendum)
You have been tested for COVID-19 today. °If your test returns positive, you will receive a phone call from Gary regarding your results. °Negative test results are not called. °Both positive and negative results area always visible on MyChart. °If you do not have a MyChart account, sign up instructions are provided in your discharge papers. °Please do not hesitate to contact us should you have questions or concerns. ° °

## 2020-04-02 NOTE — ED Provider Notes (Signed)
  [facility]   [id number] 04/02/20 Arrival Time: 3546  ASSESSMENT & PLAN:  1. Viral URI with cough   2. Close exposure to COVID-19 virus      COVID-19 testing sent. See letter/work note on file for self-isolation guidelines. OTC symptom care as needed.    Follow-up Information    [facility].   Specialty: Urgent Care Why: As needed. Contact information: [address] [place] [address] [phone]              Reviewed expectations re: course of current medical issues. Questions answered. Outlined signs and symptoms indicating need for more acute intervention. Understanding verbalized. After Visit Summary given.   SUBJECTIVE: History from: patient. Jerry Johnson is a 29 y.o. male who presents with worries regarding COVID-19. Known COVID-19 contact: daughter. Recent travel: none. Reports: chills, cough, CP from coughing, nasal cong, subj fever. Abrupt onset 3-4 d ago. Denies: difficulty breathing and headache. Normal PO intake without n/v/d.    OBJECTIVE:  Vitals:   04/02/20 1026 04/02/20 1027  BP: 124/83   Pulse: 73   Resp: 18   Temp:  99.1 F (37.3 C)  TempSrc: Oral Oral  SpO2: 97%     General appearance: alert; no distress Eyes: conjunctiva normal HENT: [place]; AT; with nasal congestion Neck: supple  Lungs: speaks full sentences without difficulty; unlabored Extremities: no edema Skin: warm and dry Neurologic: normal gait Psychological: alert and cooperative; normal mood and affect  Labs:  Labs Reviewed  SARS CORONAVIRUS 2 (TAT 6-24 HRS)    No Known Allergies  Past Medical History:  Diagnosis Date  . UNSPECIFIED HYDROCELE 03/12/2009   Qualifier: Diagnosis of  By: Burnadette Pop  MD, Trisha Mangle     Social History   Socioeconomic History  . Marital status: Single    Spouse name: Not on file  . Number of children: Not on file  . Years of education: Not on file  . Highest education  level: Not on file  Occupational History  . Not on file  Tobacco Use  . Smoking status: Current Every Day Smoker    Packs/day: 0.50    Types: Cigarettes  . Smokeless tobacco: Never Used  Vaping Use  . Vaping Use: Never used  Substance and Sexual Activity  . Alcohol use: Yes    Comment: OCCASIONAL  . Drug use: Yes    Frequency: 4.0 times per week    Types: Marijuana    Comment: marijuana---"4 blunts a day"  . Sexual activity: Not on file  Other Topics Concern  . Not on file  Social History Narrative  . Not on file   Social Determinants of Health   Financial Resource Strain: Not on file  Food Insecurity: Not on file  Transportation Needs: Not on file  Physical Activity: Not on file  Stress: Not on file  Social Connections: Not on file  Intimate Partner Violence: Not on file   Family History  Problem Relation Age of Onset  . Healthy Mother   . Healthy Father    Past Surgical History:  Procedure Laterality Date  . INGUINAL HERNIA REPAIR  03/21/09   Repair of R inguinal hernia with hydrocele     Mardella Layman, MD 04/02/20 1047

## 2020-04-02 NOTE — ED Triage Notes (Signed)
Pt states that he has a HA, chills, SOB, cough, nasal congestion, and slight fever. Pt states that sx started the 26th. Pt co workers tested positive for COVID.

## 2020-06-01 ENCOUNTER — Other Ambulatory Visit: Payer: Self-pay

## 2020-06-01 ENCOUNTER — Encounter (HOSPITAL_COMMUNITY): Payer: Self-pay | Admitting: Emergency Medicine

## 2020-06-01 ENCOUNTER — Emergency Department (HOSPITAL_COMMUNITY)
Admission: EM | Admit: 2020-06-01 | Discharge: 2020-06-01 | Disposition: A | Discharge status: Home / Self Care | Payer: Medicaid Other | Attending: Emergency Medicine | Admitting: Emergency Medicine

## 2020-06-01 DIAGNOSIS — L02415 Cutaneous abscess of right lower limb: Secondary | ICD-10-CM | POA: Insufficient documentation

## 2020-06-01 DIAGNOSIS — F17210 Nicotine dependence, cigarettes, uncomplicated: Secondary | ICD-10-CM | POA: Insufficient documentation

## 2020-06-01 DIAGNOSIS — L0291 Cutaneous abscess, unspecified: Secondary | ICD-10-CM

## 2020-06-01 MED ORDER — LIDOCAINE-EPINEPHRINE 1 %-1:100000 IJ SOLN
10.0000 mL | Freq: Once | INTRAMUSCULAR | Status: AC
  Administered 2020-06-01: 10 mL via INTRADERMAL
  Filled 2020-06-01: qty 1

## 2020-06-01 MED ORDER — SULFAMETHOXAZOLE-TRIMETHOPRIM 800-160 MG PO TABS
1.0000 | ORAL_TABLET | Freq: Two times a day (BID) | ORAL | 0 refills | Status: AC
Start: 2020-06-01 — End: 2020-06-08

## 2020-06-01 NOTE — ED Notes (Signed)
Patient verbalizes understanding of discharge instructions. Prescriptions and follow-up care reviewed. Opportunity for questioning and answers were provided. Armband removed by staff, pt discharged from ED ambulatory.  

## 2020-06-01 NOTE — ED Triage Notes (Signed)
C/o abscess to R knee x 1 week.  States it started out as a "hole" and he hit it at work afterwards and caused it to bleed.

## 2020-06-01 NOTE — ED Provider Notes (Signed)
[facility] EMERGENCY DEPARTMENT Provider Note   CSN: [id number] Arrival date & time: 06/01/20  1833     History Chief Complaint  Patient presents with  . Abscess    Jerry Johnson is a 30 y.o. male w/ no significant history who presents to the ED for R knee abscess. Patient noted small bump earlier this week that progressively got bigger and more painful. Yesterday while at work, he hit his knee and large amount of pus expressed. Patient came to ED today after he noticed continued intermittent discharge and bleeding from wound. Denies fever, chills, or proximal red streaking. No previous history of abscess.  The history is provided by the patient and medical records.  Abscess Location:  Leg Leg abscess location:  R knee Size:  2cm Abscess quality: draining, painful and redness   Red streaking: no   Duration:  5 hours Progression:  Improving Pain details:    Quality:  Sharp   Severity:  Mild   Duration:  4 days   Timing:  Constant   Progression:  Improving Chronicity:  New Context: not diabetes, not immunosuppression, not injected drug use and not insect bite/sting   Relieved by:  Draining/squeezing Worsened by:  Nothing Ineffective treatments:  None tried Associated symptoms: no fever and no vomiting   Risk factors: no hx of MRSA and no prior abscess        Past Medical History:  Diagnosis Date  . UNSPECIFIED HYDROCELE 03/12/2009   Qualifier: Diagnosis of  By: Burnadette Pop  MD, Trisha Mangle      Patient Active Problem List   Diagnosis Date Noted  . Adjustment disorder with mixed anxiety and depressed mood 10/30/2016  . Suicidal ideation 10/30/2016  . 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 Rash and nonspecific skin eruption 01/10/2014  . Otitis externa of both ears 01/10/2014  . Acute pharyngitis 06/07/2013  . Right groin pain 05/27/2012  . CONTACT DERMATITIS 12/12/2008  . ALLERGIC RHINITIS  01/11/2008  . ATTENTION DEFICIT, W/HYPERACTIVITY 06/03/2006    Past Surgical History:  Procedure Laterality Date  . INGUINAL HERNIA REPAIR  03/21/09   Repair of R inguinal hernia with hydrocele       Family History  Problem Relation Age of Onset  . Healthy Mother   . Healthy Father     Social History   Tobacco Use  . Smoking status: Current Every Day Smoker    Packs/day: 0.50    Types: Cigarettes  . Smokeless tobacco: Never Used  Vaping Use  . Vaping Use: Never used  Substance Use Topics  . Alcohol use: Yes    Comment: OCCASIONAL  . Drug use: Yes    Frequency: 4.0 times per week    Types: Marijuana    Comment: marijuana---"4 blunts a day"    Home Medications Prior to Admission medications   Medication Sig Start Date End Date Taking? Authorizing Provider  sulfamethoxazole-trimethoprim (BACTRIM DS) 800-160 MG tablet Take 1 tablet by mouth 2 (two) times daily for 7 days. 06/01/20 06/08/20 Yes Tonia Brooms, MD  ibuprofen (ADVIL,MOTRIN) 600 MG tablet Take 1 tablet (600 mg total) by mouth every 6 (six) hours as needed. 04/22/18   Elpidio Anis, PA-C    Allergies    Patient has no known allergies.  Review of Systems   Review of Systems  Constitutional: Negative for chills and fever.  HENT: Negative for ear pain and sore throat.   Eyes: Negative for pain and visual disturbance.  Respiratory: Negative for cough and shortness of breath.   Cardiovascular: Negative for chest pain and palpitations.  Gastrointestinal: Negative for abdominal pain and vomiting.  Genitourinary: Negative for dysuria and hematuria.  Musculoskeletal: Negative for arthralgias and back pain.  Skin: Positive for wound. Negative for color change and rash.  Neurological: Negative for seizures and syncope.  All other systems reviewed and are negative.   Physical Exam Updated Vital Signs BP 132/86 (BP Location: Left Arm)   Pulse 85   Temp 98.4 F (36.9 C) (Oral)   Resp 15   SpO2 100%    Physical Exam Vitals and nursing note reviewed.  Constitutional:      General: He is awake. He is not in acute distress.    Appearance: Normal appearance. He is well-developed, normal weight and well-nourished. He is not ill-appearing.  HENT:     Head: Normocephalic and atraumatic.     Right Ear: External ear normal.     Left Ear: External ear normal.     Nose: Nose normal.  Eyes:     General: No scleral icterus.       Right eye: No discharge.        Left eye: No discharge.     Conjunctiva/sclera: Conjunctivae normal.  Cardiovascular:     Rate and Rhythm: Normal rate and regular rhythm.  Pulmonary:     Effort: Pulmonary effort is normal. No respiratory distress.  Abdominal:     General: Abdomen is flat. There is no distension.     Palpations: Abdomen is soft.     Tenderness: There is no abdominal tenderness. There is no guarding or rebound.  Musculoskeletal:        General: No edema.     Cervical back: Neck supple.  Skin:    General: Skin is warm and dry.     Findings: Rash present.     Comments: 2cm diameter circular area of erythema and induration with overlying scab and no fluctuance to anterior proximal R lower leg. No proximal red streaking. Knee joint non-tender with swelling and full ROM without pain.  Neurological:     General: No focal deficit present.     Mental Status: He is alert and oriented to person, place, and time.     Sensory: No sensory deficit.     Motor: No weakness.  Psychiatric:        Mood and Affect: Mood and affect and mood normal.        Behavior: Behavior normal. Behavior is cooperative.     ED Results / Procedures / Treatments   Labs (all labs ordered are listed, but only abnormal results are displayed) Labs Reviewed - No data to display  EKG None  Radiology No results found.  Procedures .Marland KitchenIncision and Drainage  Date/Time: 06/01/2020 8:02 PM Performed by: Tonia Brooms, MD Authorized by: Jacalyn Lefevre, MD   Consent:     Consent obtained:  Verbal   Consent given by:  Patient   Risks, benefits, and alternatives were discussed: yes     Risks discussed:  Bleeding, incomplete drainage, infection, damage to other organs and pain   Alternatives discussed:  Delayed treatment Universal protocol:    Procedure explained and questions answered to patient or proxy's satisfaction: yes     Site/side marked: yes     Immediately prior to procedure, a time out was called: yes     Patient identity confirmed:  Verbally with patient and arm band Location:    Type:  Abscess  Size:  2cm   Location:  Lower extremity   Lower extremity location:  Knee   Knee location:  R knee Pre-procedure details:    Skin preparation:  Chlorhexidine Sedation:    Sedation type:  None Anesthesia:    Anesthesia method:  Local infiltration   Local anesthetic:  Lidocaine 2% WITH epi Procedure type:    Complexity:  Simple Procedure details:    Ultrasound guidance: no     Needle aspiration: no     Incision types:  Single straight   Incision depth:  Dermal   Wound management:  Probed and deloculated and irrigated with saline   Drainage:  Serosanguinous   Drainage amount:  Scant   Wound treatment:  Wound left open   Packing materials:  None Post-procedure details:    Procedure completion:  Tolerated well, no immediate complications    Medications Ordered in ED Medications  lidocaine-EPINEPHrine (XYLOCAINE W/EPI) 1 %-1:100000 (with pres) injection 10 mL (10 mLs Intradermal Given 06/01/20 1926)    ED Course  I have reviewed the triage vital signs and the nursing notes.  Pertinent labs & imaging results that were available during my care of the patient were reviewed by me and considered in my medical decision making (see chart for details).    MDM Rules/Calculators/A&P                          Patient is a 29yoM with history and physical as described above who presents to the ED for abscess. VS reassuring and HDS. Patient resting  comfortably and in no acute distress. Bedside US demonstrated small fluid pocket and no sign of FB. No sign of knee joint involvement or concern for septic arthritis. Abscess drained at bedside as documented above without immediate complication and patient tolerated procedure well. Bandage applied to wound without packing. Will prescribe 1-week course of Bactrim. Patient otherwise HDS and appropriate for discharge.  Strict return precautions provided and discussed. Questions and concerns were addressed. Recommend follow up with PCP on outpatient basis as needed. Patient verbalized understanding and amenable with discharge plan. Discharged in stable condition.  Final Clinical Impression(s) / ED Diagnoses Final diagnoses:  Abscess    Rx / DC Orders ED Discharge Orders         Ordered    sulfamethoxazole-trimethoprim (BACTRIM DS) 800-160 MG tablet  2 times daily        06/01/20 2004           Tonia Brooms, MD 06/01/20 2155    Jacalyn Lefevre, MD 06/01/20 2203

## 2020-06-20 ENCOUNTER — Other Ambulatory Visit: Payer: Self-pay

## 2020-06-20 ENCOUNTER — Ambulatory Visit (HOSPITAL_COMMUNITY)
Admission: EM | Admit: 2020-06-20 | Discharge: 2020-06-20 | Disposition: A | Discharge status: Home / Self Care | Payer: Self-pay | Attending: Student | Admitting: Student

## 2020-06-20 ENCOUNTER — Ambulatory Visit (INDEPENDENT_AMBULATORY_CARE_PROVIDER_SITE_OTHER): Payer: Self-pay

## 2020-06-20 ENCOUNTER — Encounter (HOSPITAL_COMMUNITY): Payer: Self-pay

## 2020-06-20 DIAGNOSIS — X500XXA Overexertion from strenuous movement or load, initial encounter: Secondary | ICD-10-CM

## 2020-06-20 DIAGNOSIS — S91209A Unspecified open wound of unspecified toe(s) with damage to nail, initial encounter: Secondary | ICD-10-CM

## 2020-06-20 DIAGNOSIS — T148XXA Other injury of unspecified body region, initial encounter: Secondary | ICD-10-CM

## 2020-06-20 DIAGNOSIS — S66812A Strain of other specified muscles, fascia and tendons at wrist and hand level, left hand, initial encounter: Secondary | ICD-10-CM

## 2020-06-20 DIAGNOSIS — M65312 Trigger thumb, left thumb: Secondary | ICD-10-CM

## 2020-06-20 MED ORDER — AMOXICILLIN-POT CLAVULANATE 875-125 MG PO TABS: 1.0000 | ORAL_TABLET | Freq: Two times a day (BID) | ORAL | 0 refills | Status: DC

## 2020-06-20 MED ORDER — LIDOCAINE HCL 2 % IJ SOLN
INTRAMUSCULAR | Status: AC
  Filled 2020-06-20: qty 20
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
## 2020-06-20 NOTE — Discharge Instructions (Addendum)
-  Keep your hand splint on until the pain and swelling subsides. This usually takes 1-2 weeks. If your symptoms aren't improving in about 1 week, follow-up with orthopedist (information below- [facility]). There are no bones broken, but there's still a chance you have soft tissue damage that will need further management by an orthopedist. -Clean your skin abrasions with gentle soap and water 1-2 times daily.  Let dry.  You can apply antibiotic ointment on top. -For your toe, leave this wrapped for the next 12 or so hours.  Starting tomorrow, wash this with gentle soap and water 1-2 times daily.  Apply antibiotic ointment and put another bandage on top. If this seems to be healing poorly or you continue to have pain, follow-up with podiatry (information below). -Start the antibiotic- augmentin twice daily for 7 days. This is to prevent infection in your toenail and from the skin abrasions.
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
## 2020-06-21 ENCOUNTER — Ambulatory Visit (HOSPITAL_COMMUNITY)
Admission: EM | Admit: 2020-06-21 | Discharge: 2020-06-22 | Disposition: A | Discharge status: Other Acute Inpatient Hospital | Payer: No Payment, Other | Attending: Behavioral Health | Admitting: Behavioral Health

## 2020-06-21 ENCOUNTER — Other Ambulatory Visit: Payer: Self-pay

## 2020-06-21 DIAGNOSIS — R4689 Other symptoms and signs involving appearance and behavior: Secondary | ICD-10-CM

## 2020-06-21 DIAGNOSIS — Z636 Dependent relative needing care at home: Secondary | ICD-10-CM | POA: Insufficient documentation

## 2020-06-21 DIAGNOSIS — F339 Major depressive disorder, recurrent, unspecified: Secondary | ICD-10-CM | POA: Insufficient documentation

## 2020-06-21 DIAGNOSIS — Z733 Stress, not elsewhere classified: Secondary | ICD-10-CM | POA: Insufficient documentation

## 2020-06-21 DIAGNOSIS — Z638 Other specified problems related to primary support group: Secondary | ICD-10-CM | POA: Insufficient documentation

## 2020-06-21 DIAGNOSIS — R454 Irritability and anger: Secondary | ICD-10-CM | POA: Insufficient documentation

## 2020-06-22 ENCOUNTER — Encounter (HOSPITAL_COMMUNITY): Payer: Self-pay | Admitting: Emergency Medicine

## 2020-06-22 ENCOUNTER — Emergency Department (HOSPITAL_COMMUNITY)
Admission: EM | Admit: 2020-06-22 | Discharge: 2020-06-22 | Disposition: A | Discharge status: Home / Self Care | Payer: Medicaid Other | Attending: Emergency Medicine | Admitting: Emergency Medicine

## 2020-06-22 DIAGNOSIS — F17210 Nicotine dependence, cigarettes, uncomplicated: Secondary | ICD-10-CM | POA: Insufficient documentation

## 2020-06-22 DIAGNOSIS — F339 Major depressive disorder, recurrent, unspecified: Secondary | ICD-10-CM | POA: Diagnosis not present

## 2020-06-22 DIAGNOSIS — Z711 Person with feared health complaint in whom no diagnosis is made: Secondary | ICD-10-CM | POA: Insufficient documentation

## 2020-06-22 DIAGNOSIS — Z636 Dependent relative needing care at home: Secondary | ICD-10-CM | POA: Diagnosis not present

## 2020-06-22 DIAGNOSIS — Z733 Stress, not elsewhere classified: Secondary | ICD-10-CM | POA: Diagnosis not present

## 2020-06-22 DIAGNOSIS — Z046 Encounter for general psychiatric examination, requested by authority: Secondary | ICD-10-CM | POA: Insufficient documentation

## 2020-06-22 DIAGNOSIS — R454 Irritability and anger: Secondary | ICD-10-CM | POA: Diagnosis not present

## 2020-06-22 LAB — RPR: RPR Ser Ql: NONREACTIVE

## 2020-06-22 LAB — HIV ANTIBODY (ROUTINE TESTING W REFLEX): HIV Screen 4th Generation wRfx: NONREACTIVE
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
## 2020-06-22 NOTE — ED Notes (Signed)
Pt is IVC, report called to RN [employer].

## 2020-06-22 NOTE — Discharge Instructions (Addendum)
Transfer to [facility] 
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
## 2020-06-24 LAB — GC/CHLAMYDIA PROBE AMP (~~LOC~~) NOT AT ARMC
Chlamydia: NEGATIVE
Comment: NEGATIVE
Comment: NORMAL
Neisseria Gonorrhea: NEGATIVE

## 2020-07-03 ENCOUNTER — Emergency Department (HOSPITAL_COMMUNITY): Payer: Self-pay

## 2020-07-03 ENCOUNTER — Encounter (HOSPITAL_COMMUNITY): Payer: Self-pay | Admitting: Emergency Medicine

## 2020-07-03 ENCOUNTER — Other Ambulatory Visit: Payer: Self-pay

## 2020-07-03 ENCOUNTER — Emergency Department (HOSPITAL_COMMUNITY)
Admission: EM | Admit: 2020-07-03 | Discharge: 2020-07-03 | Disposition: A | Discharge status: Home / Self Care | Payer: Self-pay | Attending: Emergency Medicine | Admitting: Emergency Medicine

## 2020-07-03 DIAGNOSIS — X500XXA Overexertion from strenuous movement or load, initial encounter: Secondary | ICD-10-CM | POA: Insufficient documentation

## 2020-07-03 DIAGNOSIS — S6992XA Unspecified injury of left wrist, hand and finger(s), initial encounter: Secondary | ICD-10-CM | POA: Insufficient documentation

## 2020-07-03 DIAGNOSIS — Y990 Civilian activity done for income or pay: Secondary | ICD-10-CM | POA: Insufficient documentation

## 2020-07-03 DIAGNOSIS — Y93F2 Activity, caregiving, lifting: Secondary | ICD-10-CM | POA: Insufficient documentation

## 2020-07-03 DIAGNOSIS — F17210 Nicotine dependence, cigarettes, uncomplicated: Secondary | ICD-10-CM | POA: Insufficient documentation

## 2020-07-03 MED ORDER — KETOROLAC TROMETHAMINE 15 MG/ML IJ SOLN
15.0000 mg | Freq: Once | INTRAMUSCULAR | Status: AC
  Administered 2020-07-03: 15 mg via INTRAMUSCULAR
  Filled 2020-07-03: qty 1

## 2020-07-03 MED ORDER — NAPROXEN 500 MG PO TABS: 500.0000 mg | ORAL_TABLET | Freq: Two times a day (BID) | ORAL | 0 refills | Status: AC

## 2020-07-03 NOTE — ED Provider Notes (Signed)
[facility]-EMERGENCY DEPT Provider Note   CSN: [id number] Arrival date & time: 07/03/20  1735     History Chief Complaint  Patient presents with  . thumb injury    Jerry Johnson is a 30 y.o. male.  30 y.o male with no medical history presents to the ED with a chief complaint of left thumb pain.  Patient reports he was at work yesterday pulling boxes, attempting to pull heavy objects when he suddenly felt his left thumb go backwards and then forward.  He reports pain to the area this extubated with movement.  He has been wearing a Velcro splint today with much improvement.  He did take some aspirin which did not help with his pain.  He was worried about any fractures.  Nuys any other injury, no prior history of gout, no fevers, no other complaints.  The history is provided by the patient.  Hand Pain This is a new problem. The current episode started yesterday. The problem occurs constantly. The problem has not changed since onset.Pertinent negatives include no chest pain. The symptoms are aggravated by stress. Nothing relieves the symptoms. He has tried ASA for the symptoms.       Past Medical History:  Diagnosis Date  . UNSPECIFIED HYDROCELE 03/12/2009   Qualifier: Diagnosis of  By: Burnadette Pop  MD, Trisha Mangle      Patient Active Problem List   Diagnosis Date Noted  . Adjustment disorder with mixed anxiety and depressed mood 10/30/2016  . Suicidal ideation 10/30/2016  . 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 Rash and nonspecific skin eruption 01/10/2014  . Otitis externa of both ears 01/10/2014  . Acute pharyngitis 06/07/2013  . Right groin pain 05/27/2012  . CONTACT DERMATITIS 12/12/2008  . ALLERGIC RHINITIS 01/11/2008  . ATTENTION DEFICIT, W/HYPERACTIVITY 06/03/2006    Past Surgical History:  Procedure Laterality Date  . INGUINAL HERNIA REPAIR  03/21/09   Repair of R inguinal hernia with  hydrocele       Family History  Problem Relation Age of Onset  . Healthy Mother   . Healthy Father     Social History   Tobacco Use  . Smoking status: Current Every Day Smoker    Packs/day: 0.50    Types: Cigarettes  . Smokeless tobacco: Never Used  Vaping Use  . Vaping Use: Never used  Substance Use Topics  . Alcohol use: Yes    Comment: OCCASIONAL  . Drug use: Yes    Frequency: 4.0 times per week    Types: Marijuana    Comment: marijuana---"4 blunts a day"    Home Medications Prior to Admission medications   Medication Sig Start Date End Date Taking? Authorizing Provider  naproxen (NAPROSYN) 500 MG tablet Take 1 tablet (500 mg total) by mouth 2 (two) times daily for 7 days. 07/03/20 07/10/20 Yes Berit Raczkowski, Leonie Douglas, PA-C  amoxicillin-clavulanate (AUGMENTIN) 875-125 MG tablet Take 1 tablet by mouth every 12 (twelve) hours. 06/20/20   Rhys Martini, PA-C  ibuprofen (ADVIL,MOTRIN) 600 MG tablet Take 1 tablet (600 mg total) by mouth every 6 (six) hours as needed. 04/22/18   Elpidio Anis, PA-C    Allergies    Patient has no known allergies.  Review of Systems   Review of Systems  Cardiovascular: Negative for chest pain.  Musculoskeletal: Positive for arthralgias.    Physical Exam Updated Vital Signs BP 130/74 (BP Location: Right Arm)   Pulse 81   Temp 98.7 F (37.1  C) (Oral)   Resp 18   SpO2 99%   Physical Exam Vitals and nursing note reviewed.  Constitutional:      Appearance: Normal appearance.  HENT:     Head: Normocephalic and atraumatic.  Cardiovascular:     Rate and Rhythm: Normal rate.  Pulmonary:     Effort: Pulmonary effort is normal.  Abdominal:     General: Abdomen is flat.     Tenderness: There is no abdominal tenderness. There is no right CVA tenderness or left CVA tenderness.  Musculoskeletal:        General: Swelling, tenderness and signs of injury present. No deformity.     Right hand: Swelling and tenderness present. No deformity or  lacerations. Normal range of motion.     Cervical back: Normal range of motion and neck supple.     Comments: Pulses present, capillary refill is intact.  Mild swelling noted to the inner region.  No changes in the skin, no lacerations or abrasions noted.  No snuffbox tenderness.  Skin:    General: Skin is warm and dry.  Neurological:     Mental Status: He is alert and oriented to person, place, and time.     ED Results / Procedures / Treatments   Labs (all labs ordered are listed, but only abnormal results are displayed) Labs Reviewed - No data to display  EKG None  Radiology DG Finger Thumb Left  Result Date: 07/03/2020 CLINICAL DATA:  Hyperextension injury EXAM: LEFT THUMB 2+V COMPARISON:  06/20/2020 FINDINGS: There is no evidence of fracture or dislocation. There is no evidence of arthropathy or other focal bone abnormality. Soft tissues are unremarkable. IMPRESSION: Negative. Electronically Signed   By: Kreg Shropshire M.D.   On: 07/03/2020 18:31    Procedures Procedures   Medications Ordered in ED Medications  ketorolac (TORADOL) 15 MG/ML injection 15 mg (has no administration in time range)    ED Course  I have reviewed the triage vital signs and the nursing notes.  Pertinent labs & imaging results that were available during my care of the patient were reviewed by me and considered in my medical decision making (see chart for details).    MDM Rules/Calculators/A&P   She with no pertinent past medical history presents to the ED with a chief complaint of thumb pain after injury while working yesterday.  Reports he placed his on a Velcro splint, took aspirin without any improvement in symptoms.  During evaluation patient is overall well-appearing, vitals are within normal limits, there is no fever.  No prior history of IV drug use.  No prior history of gout, does have full range of motion of the thumb with some swelling to the thenar region but without any skin changes.   We discussed symptomatic treatment with NSAIDs along with rice therapy.  X-ray of the left thumb did not show any fracture or dislocation.  There is no tenderness at the snuffbox.  We discussed symptomatic treatment, will receive Toradol today's visit.  Vitals are within normal limits, patient is requesting note to return back to work.  Return precautions discussed at length.  Portions of this note were generated with Scientist, clinical (histocompatibility and immunogenetics). Dictation errors may occur despite best attempts at proofreading.  Final Clinical Impression(s) / ED Diagnoses Final diagnoses:  Injury of left thumb, initial encounter    Rx / DC Orders ED Discharge Orders         Ordered    naproxen (NAPROSYN) 500 MG tablet  2  times daily        07/03/20 1946           Freddy Jaksch 07/03/20 Barbarann Ehlers, MD 07/06/20 [phone]
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
## 2020-07-03 NOTE — ED Triage Notes (Signed)
Per patient, states he injured his left thumb a couple of weeks ago-states he had xray-states he has not followed up with ortho-states he thinks he reinjured it at work last night

## 2020-07-03 NOTE — Discharge Instructions (Addendum)
We discussed results of your x-ray on today's visit.  I have prescribed medication to help with your pain and swelling.  Please take 1 tablet twice a day with food for the next 7 days.  You may keep your left hand on a splint.

## 2020-09-06 ENCOUNTER — Emergency Department (HOSPITAL_COMMUNITY)
Admission: EM | Admit: 2020-09-06 | Discharge: 2020-09-06 | Disposition: A | Discharge status: Home / Self Care | Payer: Medicaid Other | Attending: Emergency Medicine | Admitting: Emergency Medicine

## 2020-09-06 ENCOUNTER — Other Ambulatory Visit: Payer: Self-pay

## 2020-09-06 DIAGNOSIS — F17210 Nicotine dependence, cigarettes, uncomplicated: Secondary | ICD-10-CM | POA: Insufficient documentation

## 2020-09-06 DIAGNOSIS — M79645 Pain in left finger(s): Secondary | ICD-10-CM | POA: Insufficient documentation

## 2020-09-06 MED ORDER — IBUPROFEN 600 MG PO TABS: 600.0000 mg | ORAL_TABLET | Freq: Four times a day (QID) | ORAL | 0 refills | Status: DC | PRN

## 2020-09-06 NOTE — Discharge Instructions (Addendum)
Please consider reaching out to hand specialist for further evaluation of your recurrent left thumb pain.  This is likely a sprain or potential tendon or ligamentous injury.  You may benefit from an MRI of the thumb if deemed appropriate.
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
## 2020-09-06 NOTE — ED Triage Notes (Signed)
Pt came from work via POV. Pt reports having x-rays done on left thumb a couple months ago, denies presence of fracture at that time. Pt reports his left thumb joint is protruding more today and pain in intolerable while lifting objects at work. Pt reports brace given a couple months ago has helped but pt does not feel that thumb has healed properly. Pt unable to tolerate normal activity using hand d/t pain.

## 2020-09-13 ENCOUNTER — Other Ambulatory Visit: Payer: Self-pay

## 2020-09-13 ENCOUNTER — Emergency Department (HOSPITAL_COMMUNITY)
Admission: EM | Admit: 2020-09-13 | Discharge: 2020-09-13 | Disposition: A | Discharge status: Home / Self Care | Payer: Medicaid Other | Attending: Physician Assistant | Admitting: Physician Assistant

## 2020-09-13 DIAGNOSIS — R519 Headache, unspecified: Secondary | ICD-10-CM | POA: Insufficient documentation

## 2020-09-13 DIAGNOSIS — Z20822 Contact with and (suspected) exposure to covid-19: Secondary | ICD-10-CM | POA: Insufficient documentation

## 2020-09-13 DIAGNOSIS — R5383 Other fatigue: Secondary | ICD-10-CM | POA: Insufficient documentation

## 2020-09-13 DIAGNOSIS — R509 Fever, unspecified: Secondary | ICD-10-CM | POA: Insufficient documentation

## 2020-09-13 DIAGNOSIS — Z5321 Procedure and treatment not carried out due to patient leaving prior to being seen by health care provider: Secondary | ICD-10-CM | POA: Insufficient documentation

## 2020-09-13 LAB — SARS CORONAVIRUS 2 (TAT 6-24 HRS): SARS Coronavirus 2: NEGATIVE

## 2020-09-13 MED ORDER — ACETAMINOPHEN 325 MG PO TABS
650.0000 mg | ORAL_TABLET | Freq: Once | ORAL | Status: AC
  Administered 2020-09-13: 650 mg via ORAL
  Filled 2020-09-13: qty 2

## 2020-09-13 NOTE — ED Notes (Signed)
Patient called for vitals x2, no answer.

## 2020-09-13 NOTE — ED Provider Notes (Signed)
Emergency Medicine Provider Triage Evaluation Note  Jerry Johnson , a 30 y.o. male  was evaluated in triage.  Pt complains of fever, headache for 4 days.  Review of Systems  Positive: Fever, headache, nausea Negative: vomiting  Physical Exam  BP 123/80 (BP Location: Left Arm)   Pulse 97   Temp (!) 102.4 F (39.1 C) (Oral)   Resp 16   SpO2 98%  Gen:   Awake, no distress   Resp:  Normal effort  MSK:   Moves extremities without difficulty   Medical Decision Making  Medically screening exam initiated at 1:22 PM.  Appropriate orders placed.  Jerry Johnson was informed that the remainder of the evaluation will be completed by another provider, this initial triage assessment does not replace that evaluation, and the importance of remaining in the ED until their evaluation is complete.     Karrie Meres, PA-C 09/13/20 1323    Vanetta Mulders, MD 09/14/20 [phone]

## 2020-09-13 NOTE — ED Triage Notes (Signed)
Pt reports fever, fatigue, and headache x 4 days.

## 2020-09-17 ENCOUNTER — Ambulatory Visit: Payer: Self-pay

## 2020-09-17 NOTE — Telephone Encounter (Signed)
Pt. Reports "I have been feeling bad for 2 weeks.I went to the ED but left because I couldn't sit there that long." States having abdominal pain with diarrhea, headache and chills. COVID tests "have been negative." Does not have  PCP. Instructed to go to UC/ED. Verbalizes understanding.

## 2020-09-17 NOTE — Telephone Encounter (Signed)
Reason for Disposition  [1] MILD-MODERATE pain AND [2] constant AND [3] present > 2 hours  Answer Assessment - Initial Assessment Questions 1. LOCATION: "Where does it hurt?"      Lower 2. RADIATION: "Does the pain shoot anywhere else?" (e.g., chest, back)     Yes 3. ONSET: "When did the pain begin?" (Minutes, hours or days ago)      2 weeks ago 4. SUDDEN: "Gradual or sudden onset?"     Sudden 5. PATTERN "Does the pain come and go, or is it constant?"    - If constant: "Is it getting better, staying the same, or worsening?"      (Note: Constant means the pain never goes away completely; most serious pain is constant and it progresses)     - If intermittent: "How long does it last?" "Do you have pain now?"     (Note: Intermittent means the pain goes away completely between bouts)     Constant 6. SEVERITY: "How bad is the pain?"  (e.g., Scale 1-10; mild, moderate, or severe)    - MILD (1-3): doesn't interfere with normal activities, abdomen soft and not tender to touch     - MODERATE (4-7): interferes with normal activities or awakens from sleep, abdomen tender to touch     - SEVERE (8-10): excruciating pain, doubled over, unable to do any normal activities       10 7. RECURRENT SYMPTOM: "Have you ever had this type of stomach pain before?" If Yes, ask: "When was the last time?" and "What happened that time?"      No 8. CAUSE: "What do you think is causing the stomach pain?"     Unsure 9. RELIEVING/AGGRAVATING FACTORS: "What makes it better or worse?" (e.g., movement, antacids, bowel movement)     No 10. OTHER SYMPTOMS: "Do you have any other symptoms?" (e.g., back pain, diarrhea, fever, urination pain, vomiting)       Diarrhea, headache, chills  Protocols used: Abdominal Pain - Male-A-AH

## 2020-09-20 ENCOUNTER — Emergency Department (HOSPITAL_COMMUNITY): Payer: Self-pay

## 2020-09-20 ENCOUNTER — Other Ambulatory Visit: Payer: Self-pay

## 2020-09-20 ENCOUNTER — Encounter (HOSPITAL_COMMUNITY): Payer: Self-pay

## 2020-09-20 ENCOUNTER — Other Ambulatory Visit (HOSPITAL_COMMUNITY): Payer: Self-pay

## 2020-09-20 ENCOUNTER — Emergency Department (HOSPITAL_COMMUNITY)
Admission: EM | Admit: 2020-09-20 | Discharge: 2020-09-20 | Disposition: A | Discharge status: Home / Self Care | Payer: Self-pay | Attending: Emergency Medicine | Admitting: Emergency Medicine

## 2020-09-20 DIAGNOSIS — Z8619 Personal history of other infectious and parasitic diseases: Secondary | ICD-10-CM

## 2020-09-20 DIAGNOSIS — F17210 Nicotine dependence, cigarettes, uncomplicated: Secondary | ICD-10-CM | POA: Insufficient documentation

## 2020-09-20 DIAGNOSIS — R109 Unspecified abdominal pain: Secondary | ICD-10-CM | POA: Insufficient documentation

## 2020-09-20 DIAGNOSIS — B20 Human immunodeficiency virus [HIV] disease: Secondary | ICD-10-CM | POA: Insufficient documentation

## 2020-09-20 DIAGNOSIS — Z20822 Contact with and (suspected) exposure to covid-19: Secondary | ICD-10-CM | POA: Insufficient documentation

## 2020-09-20 DIAGNOSIS — R300 Dysuria: Secondary | ICD-10-CM | POA: Insufficient documentation

## 2020-09-20 DIAGNOSIS — Z21 Asymptomatic human immunodeficiency virus [HIV] infection status: Secondary | ICD-10-CM

## 2020-09-20 DIAGNOSIS — K6289 Other specified diseases of anus and rectum: Secondary | ICD-10-CM | POA: Insufficient documentation

## 2020-09-20 DIAGNOSIS — R630 Anorexia: Secondary | ICD-10-CM

## 2020-09-20 LAB — CBC WITH DIFFERENTIAL/PLATELET
Abs Immature Granulocytes: 0.07 10*3/uL (ref 0.00–0.07)
Basophils Absolute: 0 10*3/uL (ref 0.0–0.1)
Basophils Relative: 0 %
Eosinophils Absolute: 0.1 10*3/uL (ref 0.0–0.5)
Eosinophils Relative: 2 %
HCT: 38.1 % — ABNORMAL LOW (ref 39.0–52.0)
Hemoglobin: 12.3 g/dL — ABNORMAL LOW (ref 13.0–17.0)
Immature Granulocytes: 2 %
Lymphocytes Relative: 25 %
Lymphs Abs: 1.1 10*3/uL (ref 0.7–4.0)
MCH: 24.8 pg — ABNORMAL LOW (ref 26.0–34.0)
MCHC: 32.3 g/dL (ref 30.0–36.0)
MCV: 76.8 fL — ABNORMAL LOW (ref 80.0–100.0)
Monocytes Absolute: 0.5 10*3/uL (ref 0.1–1.0)
Monocytes Relative: 10 %
Neutro Abs: 2.8 10*3/uL (ref 1.7–7.7)
Neutrophils Relative %: 61 %
Platelets: 216 10*3/uL (ref 150–400)
RBC: 4.96 MIL/uL (ref 4.22–5.81)
RDW: 14.5 % (ref 11.5–15.5)
WBC: 4.6 10*3/uL (ref 4.0–10.5)
nRBC: 0 % (ref 0.0–0.2)

## 2020-09-20 LAB — URINALYSIS, ROUTINE W REFLEX MICROSCOPIC
Glucose, UA: NEGATIVE mg/dL
Hgb urine dipstick: NEGATIVE
Leukocytes,Ua: NEGATIVE
Nitrite: NEGATIVE
Protein, ur: 300 mg/dL — AB
Specific Gravity, Urine: 1.03 (ref 1.005–1.030)
pH: 6 (ref 5.0–8.0)

## 2020-09-20 LAB — COMPREHENSIVE METABOLIC PANEL
ALT: 16 U/L (ref 0–44)
AST: 32 U/L (ref 15–41)
Albumin: 3.7 g/dL (ref 3.5–5.0)
Alkaline Phosphatase: 70 U/L (ref 38–126)
Anion gap: 7 (ref 5–15)
BUN: 9 mg/dL (ref 6–20)
CO2: 27 mmol/L (ref 22–32)
Calcium: 9 mg/dL (ref 8.9–10.3)
Chloride: 100 mmol/L (ref 98–111)
Creatinine, Ser: 0.86 mg/dL (ref 0.61–1.24)
GFR, Estimated: >60 mL/min (ref >60)
Glucose, Bld: 109 mg/dL — ABNORMAL HIGH (ref 70–99)
Potassium: 3.4 mmol/L — ABNORMAL LOW (ref 3.5–5.1)
Sodium: 134 mmol/L — ABNORMAL LOW (ref 135–145)
Total Bilirubin: 0.7 mg/dL (ref 0.3–1.2)
Total Protein: 7.3 g/dL (ref 6.5–8.1)

## 2020-09-20 LAB — RESP PANEL BY RT-PCR (FLU A&B, COVID) ARPGX2
Influenza A by PCR: NEGATIVE
Influenza B by PCR: NEGATIVE
SARS Coronavirus 2 by RT PCR: NEGATIVE

## 2020-09-20 LAB — T-HELPER CELLS (CD4) COUNT (NOT AT ARMC)
CD4 % Helper T Cell: 30 % — ABNORMAL LOW (ref 33–65)
CD4 T Cell Abs: 220 /uL — ABNORMAL LOW (ref 400–1790)

## 2020-09-20 LAB — RAPID HIV SCREEN (HIV 1/2 AB+AG)
HIV 1/2 Antibodies: REACTIVE — AB
HIV-1 P24 Antigen - HIV24: NONREACTIVE

## 2020-09-20 LAB — RPR: RPR Ser Ql: NONREACTIVE

## 2020-09-20 MED ORDER — BICTEGRAVIR-EMTRICITAB-TENOFOV 50-200-25 MG PO TABS
1.0000 | ORAL_TABLET | Freq: Every day | ORAL | Status: DC
  Administered 2020-09-20: 1 via ORAL
  Filled 2020-09-20: qty 1

## 2020-09-20 MED ORDER — BIKTARVY 50-200-25 MG PO TABS
1.0000 | ORAL_TABLET | Freq: Every day | ORAL | 0 refills | Status: DC
  Filled 2020-09-20: qty 30, 30d supply, fill #0

## 2020-09-20 MED ORDER — CEFTRIAXONE SODIUM 1 G IJ SOLR
500.0000 mg | Freq: Once | INTRAMUSCULAR | Status: AC
  Administered 2020-09-20: 500 mg via INTRAMUSCULAR
  Filled 2020-09-20: qty 10

## 2020-09-20 MED ORDER — DOXYCYCLINE HYCLATE 100 MG PO CAPS
100.0000 mg | ORAL_CAPSULE | Freq: Two times a day (BID) | ORAL | 0 refills | Status: AC
  Filled 2020-09-20: qty 14, 7d supply, fill #0

## 2020-09-20 MED ORDER — SODIUM CHLORIDE (PF) 0.9 % IJ SOLN
INTRAMUSCULAR | Status: AC
  Filled 2020-09-20: qty 50

## 2020-09-20 MED ORDER — LIDOCAINE HCL 2 % IJ SOLN
INTRAMUSCULAR | Status: AC
  Administered 2020-09-20: 400 mg
  Filled 2020-09-20: qty 20

## 2020-09-20 MED ORDER — ONDANSETRON 4 MG PO TBDP
4.0000 mg | ORAL_TABLET | Freq: Three times a day (TID) | ORAL | 0 refills | Status: DC | PRN
  Filled 2020-09-20: qty 20, 7d supply, fill #0

## 2020-09-20 MED ORDER — DOXYCYCLINE HYCLATE 100 MG PO TABS
100.0000 mg | ORAL_TABLET | Freq: Once | ORAL | Status: AC
  Administered 2020-09-20: 100 mg via ORAL
  Filled 2020-09-20: qty 1

## 2020-09-20 MED ORDER — IOHEXOL 300 MG/ML  SOLN
100.0000 mL | Freq: Once | INTRAMUSCULAR | Status: AC | PRN
  Administered 2020-09-20: 100 mL via INTRAVENOUS

## 2020-09-20 NOTE — ED Triage Notes (Signed)
Pt arrived via POV, c/o loss of taste and appetite, fevers, fatigue and diarrhea x2 weeks. States recently tested for COVID and negative.

## 2020-09-20 NOTE — Consult Note (Addendum)
[facility]  Total days of antibiotics 1       Reason for Consult:hiv ab+ test, newly diagnosed   Referring Physician: wright  Active Problems:   * No active hospital problems. *    HPI: Jerry Johnson is a 30 y.o. male M who reports malaise, fevers, loss of appetite x 4 weeks, also having anal discomfort ,diarrhea. He reports having recent unprotected sex. Exam showing fissure no rash. He last tested for HIV  and STI screening in mid march which negative at the time. He has not been able to work given his symptoms. Has also endorsed weight loss due to poor appetite. His mother is at his side for emotional support. His hiv testing preliminary is + ab/p24 ag negative.   HIV RF = unprotected anal sex, receptive partner. Has sex with men and women  He was seen in the ED on 6/11 for N/V. Headache; 6/3 left thumb injury  Past Medical History:  Diagnosis Date   UNSPECIFIED HYDROCELE 03/12/2009   Qualifier: Diagnosis of  By: Burnadette Pop  MD, Trisha Mangle      Allergies: No Known Allergies   MEDICATIONS:  bictegravir-emtricitabine-tenofovir AF  1 tablet Oral Daily   sodium chloride (PF)        Social History   Tobacco Use   Smoking status: Every Day    Packs/day: 0.50    Pack years: 0.00    Types: Cigarettes   Smokeless tobacco: Never  Vaping Use   Vaping Use: Never used  Substance Use Topics   Alcohol use: Yes    Comment: OCCASIONAL   Drug use: Yes    Frequency: 4.0 times per week    Types: Marijuana    Comment: marijuana---"4 blunts a day"    Family History  Problem Relation Age of Onset   Healthy Mother    Healthy Father     Review of Systems -   Constitutional: positive for fever, chills, diaphoresis, activity change, appetite change, fatigue and unexpected weight change.  HENT: Negative for congestion, sore throat, rhinorrhea, sneezing, trouble swallowing and sinus pressure.  Eyes: Negative for photophobia and visual disturbance.   Respiratory: Negative for cough, chest tightness, shortness of breath, wheezing and stridor.  Cardiovascular: Negative for chest pain, palpitations and leg swelling.  Gastrointestinal: positive for nausea, vomiting, abdominal pain, diarrhea, constipation, blood in stool, abdominal distention and anal bleeding.  Genitourinary: Negative for dysuria, hematuria, flank pain and difficulty urinating.  Musculoskeletal: Negative for myalgias, back pain, joint swelling, arthralgias and gait problem.  Skin: Negative for color change, pallor, rash and wound.  Neurological: Negative for dizziness, tremors, weakness and light-headedness.  Hematological: Negative for adenopathy. Does not bruise/bleed easily.  Psychiatric/Behavioral: Negative for behavioral problems, confusion, sleep disturbance, dysphoric mood, decreased concentration and agitation.     OBJECTIVE: Temp:  [98.6 F (37 C)] 98.6 F (37 C) (06/17 0801) Pulse Rate:  [76-97] 97 (06/17 1137) Resp:  [16-18] 16 (06/17 1137) BP: (115-132)/(74) 115/74 (06/17 1137) SpO2:  [98 %-99 %] 99 % (06/17 1137) Physical Exam  Constitutional: He is oriented to person, place, and time. He appears well-developed and well-nourished. No distress.  HENT: hypopigmentation about bridge of nose, nares Mouth/Throat: Oropharynx is clear and moist. No oropharyngeal exudate.  Cardiovascular: Normal rate, regular rhythm and normal heart sounds. Exam reveals no gallop and no friction rub.  No murmur heard.  Pulmonary/Chest: Effort normal and breath sounds normal. No respiratory distress. He has no wheezes.  Abdominal: Soft.  Bowel sounds are normal. He exhibits no distension. There is no tenderness.  Lymphadenopathy:  He has no cervical adenopathy. +xillary LN Neurological: He is alert and oriented to person, place, and time.  Skin: Skin is warm and dry. No rash noted. No erythema.  Psychiatric: He has a normal mood and affect. His behavior is normal.     LABS: Results for orders placed or performed during the hospital encounter of 09/20/20 (from the past 48 hour(s))  Resp Panel by RT-PCR (Flu A&B, Covid) Nasopharyngeal Swab     Status: None   Collection Time: 09/20/20  8:25 AM   Specimen: Nasopharyngeal Swab; Nasopharyngeal(NP) swabs in vial transport medium  Result Value Ref Range   SARS Coronavirus 2 by RT PCR NEGATIVE NEGATIVE    Comment: (NOTE) SARS-CoV-2 target nucleic acids are NOT DETECTED.  The SARS-CoV-2 RNA is generally detectable in upper respiratory specimens during the acute phase of infection. The lowest concentration of SARS-CoV-2 viral copies this assay can detect is 138 copies/mL. A negative result does not preclude SARS-Cov-2 infection and should not be used as the sole basis for treatment or other patient management decisions. A negative result may occur with  improper specimen collection/handling, submission of specimen other than nasopharyngeal swab, presence of viral mutation(s) within the areas targeted by this assay, and inadequate number of viral copies(<138 copies/mL). A negative result must be combined with clinical observations, patient history, and epidemiological information. The expected result is Negative.  Fact Sheet for Patients:  BloggerCourse.comhttps://www.fda.gov/media/152166/download  Fact Sheet for Healthcare Providers:  SeriousBroker.ithttps://www.fda.gov/media/152162/download  This test is no t yet approved or cleared by the [place] FDA and  has been authorized for detection and/or diagnosis of SARS-CoV-2 by FDA under an Emergency Use Authorization (EUA). This EUA will remain  in effect (meaning this test can be used) for the duration of the COVID-19 declaration under Section 564(b)(1) of the Act, 21 U.S.C.section 360bbb-3(b)(1), unless the authorization is terminated  or revoked sooner.       Influenza A by PCR NEGATIVE NEGATIVE   Influenza B by PCR NEGATIVE NEGATIVE    Comment: (NOTE) The Xpert Xpress  SARS-CoV-2/FLU/RSV plus assay is intended as an aid in the diagnosis of influenza from Nasopharyngeal swab specimens and should not be used as a sole basis for treatment. Nasal washings and aspirates are unacceptable for Xpert Xpress SARS-CoV-2/FLU/RSV testing.  Fact Sheet for Patients: BloggerCourse.comhttps://www.fda.gov/media/152166/download  Fact Sheet for Healthcare Providers: SeriousBroker.ithttps://www.fda.gov/media/152162/download  This test is not yet approved or cleared by the [place] FDA and has been authorized for detection and/or diagnosis of SARS-CoV-2 by FDA under an Emergency Use Authorization (EUA). This EUA will remain in effect (meaning this test can be used) for the duration of the COVID-19 declaration under Section 564(b)(1) of the Act, 21 U.S.C. section 360bbb-3(b)(1), unless the authorization is terminated or revoked.  Performed at [facility], [address] [place], [place] [address]   CBC with Differential     Status: Abnormal   Collection Time: 09/20/20  9:03 AM  Result Value Ref Range   WBC 4.6 4.0 - 10.5 K/uL   RBC 4.96 4.22 - 5.81 MIL/uL   Hemoglobin 12.3 (L) 13.0 - 17.0 g/dL   HCT 11.938.1 (L) 14.739.0 - 82.952.0 %   MCV 76.8 (L) 80.0 - 100.0 fL   MCH 24.8 (L) 26.0 - 34.0 pg   MCHC 32.3 30.0 - 36.0 g/dL   RDW 56.214.5 13.011.5 - 86.515.5 %   Platelets 216 150 - 400  K/uL   nRBC 0.0 0.0 - 0.2 %   Neutrophils Relative % 61 %   Neutro Abs 2.8 1.7 - 7.7 K/uL   Lymphocytes Relative 25 %   Lymphs Abs 1.1 0.7 - 4.0 K/uL   Monocytes Relative 10 %   Monocytes Absolute 0.5 0.1 - 1.0 K/uL   Eosinophils Relative 2 %   Eosinophils Absolute 0.1 0.0 - 0.5 K/uL   Basophils Relative 0 %   Basophils Absolute 0.0 0.0 - 0.1 K/uL   Immature Granulocytes 2 %   Abs Immature Granulocytes 0.07 0.00 - 0.07 K/uL   Reactive, Benign Lymphocytes PRESENT     Comment: Performed at [facility], [address] [place], [place] [address]  Comprehensive metabolic panel     Status:  Abnormal   Collection Time: 09/20/20  9:03 AM  Result Value Ref Range   Sodium 134 (L) 135 - 145 mmol/L   Potassium 3.4 (L) 3.5 - 5.1 mmol/L   Chloride 100 98 - 111 mmol/L   CO2 27 22 - 32 mmol/L   Glucose, Bld 109 (H) 70 - 99 mg/dL    Comment: Glucose reference range applies only to samples taken after fasting for at least 8 hours.   BUN 9 6 - 20 mg/dL   Creatinine, Ser 8.41 0.61 - 1.24 mg/dL   Calcium 9.0 8.9 - 32.4 mg/dL   Total Protein 7.3 6.5 - 8.1 g/dL   Albumin 3.7 3.5 - 5.0 g/dL   AST 32 15 - 41 U/L   ALT 16 0 - 44 U/L   Alkaline Phosphatase 70 38 - 126 U/L   Total Bilirubin 0.7 0.3 - 1.2 mg/dL   GFR, Estimated >40 >10 mL/min    Comment: (NOTE) Calculated using the CKD-EPI Creatinine Equation (2021)    Anion gap 7 5 - 15    Comment: Performed at [facility], [address] [place], [place] [address]  Urinalysis, Routine w reflex microscopic PATH Cytology Urine     Status: Abnormal   Collection Time: 09/20/20  9:03 AM  Result Value Ref Range   Color, Urine YELLOW (A) YELLOW   APPearance CLEAR (A) CLEAR   Specific Gravity, Urine 1.030 1.005 - 1.030   pH 6.0 5.0 - 8.0   Glucose, UA NEGATIVE NEGATIVE mg/dL   Hgb urine dipstick NEGATIVE NEGATIVE   Bilirubin Urine SMALL (A) NEGATIVE   Ketones, ur TRACE (A) NEGATIVE mg/dL   Protein, ur 664 (A) NEGATIVE mg/dL   Nitrite NEGATIVE NEGATIVE   Leukocytes,Ua NEGATIVE NEGATIVE   RBC / HPF 0-5 0 - 5 RBC/hpf   WBC, UA 11-20 0 - 5 WBC/hpf   Bacteria, UA RARE (A) NONE SEEN   Squamous Epithelial / LPF 0-5 0 - 5   Mucus PRESENT    Hyaline Casts, UA PRESENT     Comment: Performed at [facility], [address], [place], [place] [address]  Rapid HIV screen (HIV 1/2 Ab+Ag)     Status: Abnormal   Collection Time: 09/20/20  9:03 AM  Result Value Ref Range   HIV-1 P24 Antigen - HIV24 NON REACTIVE NON REACTIVE    Comment: (NOTE) Detection of p24 may be inhibited by biotin in the sample,  causing false negative results in acute infection.    HIV 1/2 Antibodies Reactive (A) NON REACTIVE    Comment: REPEATED TO VERIFY   Interpretation (HIV Ag Ab)      A reactive test result means that HIV 1  or HIV 2 antibodies have been detected in the specimen. The test result is interpreted as Preliminary Positive for HIV 1 and/or HIV 2 antibodies.    Comment: SENT FOR CONFIRMATION RESULT CALLED TO, READ BACK BY AND VERIFIED WITH: GARRISON,G. RN AT 1056 09/20/20 MULLINS,T Performed at [facility], [address] [place], [place] [address]   RPR     Status: None   Collection Time: 09/20/20  9:03 AM  Result Value Ref Range   RPR Ser Ql NON REACTIVE NON REACTIVE    Comment: Performed at [facility], [address], [place], [place] [address]    MICRO: reviewed IMAGING: CT ABDOMEN PELVIS W CONTRAST  Result Date: 09/20/2020 CLINICAL DATA:  Diarrhea and lower abdominal pain EXAM: CT ABDOMEN AND PELVIS WITH CONTRAST TECHNIQUE: Multidetector CT imaging of the abdomen and pelvis was performed using the standard protocol following bolus administration of intravenous contrast. CONTRAST:  OMNIPAQUE IOHEXOL 300 MG/ML  SOLN COMPARISON:  None. FINDINGS: Lower chest: No acute abnormality. Hepatobiliary: No focal liver abnormality is seen. No gallstones, gallbladder wall thickening, or biliary dilatation. Pancreas: Unremarkable. No pancreatic ductal dilatation or surrounding inflammatory changes. Spleen: Unremarkable. Adrenals/Urinary Tract: Adrenals, kidneys, and partially distended bladder are unremarkable. Stomach/Bowel: Stomach is within normal limits. Bowel is normal in caliber and decompressed. Mild colonic wall thickening is favored to be due to under distension. Appendix measures top normal in caliber. There are no surrounding inflammatory changes. Vascular/Lymphatic: No significant vascular abnormality on this noncontrast study. No enlarged lymph nodes identified  Reproductive: Unremarkable. Other: No free fluid.  Abdominal wall is unremarkable. Musculoskeletal: No acute or significant osseous abnormality. IMPRESSION: No definite acute abnormality. Mild colonic wall thickening is favored to be due to under distension which could be related to colitis. Electronically Signed   By: Guadlupe Spanish M.D.   On: 09/20/2020 10:34     Assessment/Plan:  30yo M with systemic symptoms x 4-6 wk now with anal discomfort found to have preliminary hiv testing +  - awaiting confirmation - given risk factors, will start him on biktarvy to take daily - recommend to send home with phenergan 25mg  daily - discussed adherence - will check his cd 4 count, HIV VL - has appt this Monday at 6/20 at 2pm in ID clinic so that we can do further work up and provide care - mother is supportive  Hx of anxiety/depression = may benefit from talking to clinic counselor  - if false positive, then we will offer PrEP to patient.  Anal discomfort = could be due to sti vs. Tear. Recommend testing for GC/Chlam/RPR, pre-emptive treatment with ceftriaxone and azithromycin  Decrease appetite = possibly related to hiv disease, will reassess at his visit next week. Encourage to continue to try to take food and especially water given heat index.

## 2020-09-20 NOTE — TOC Benefit Eligibility Note (Signed)
Patient Advocate Encounter  Completed and sent [employer] Access application for Biktarvy for this patient who is uninsured.    Patient is approved  BIN      [id number] PCN    [id number] GRP    [id number] ID        [id number]    Roland Earl, CPhT Pharmacy Patient Advocate Specialist [facility] Team Direct Number: [phone]  Fax: [phone]

## 2020-09-20 NOTE — ED Notes (Signed)
Rapid HIV antigen non-reactive, antibodies were reactive, Delford Field MD called and notified of results.

## 2020-09-20 NOTE — ED Provider Notes (Signed)
[facility] Provider Note   CSN: [id number] Arrival date & time: 09/20/20  [id number]     History Chief Complaint  Patient presents with   Fever   Fatigue    Jerry RecordShaquille F Johnson is a 30 y.o. male.  The history is provided by the patient.  Male GU Problem Presenting symptoms: dysuria   Presenting symptoms comment:  Rectal pain Context comment:  Has had rectal pain and constipation for 5 weeks after a bout of unprotected anal intercourse Relieved by:  Nothing Exacerbated by: attempts to have a bowel movement. Ineffective treatments:  None tried Associated symptoms: abdominal pain and fever   Associated symptoms: no diarrhea, no genital itching, no genital lesions, no genital rash, no hematuria, no nausea, no scrotal swelling, no urinary frequency, no urinary retention and no vomiting       Past Medical History:  Diagnosis Date   UNSPECIFIED HYDROCELE 03/12/2009   Qualifier: Diagnosis of  By: Burnadette PopLinthavong  MD, Trisha MangleKanhka      Patient Active Problem List   Diagnosis Date Noted   Adjustment disorder with mixed anxiety and depressed mood 10/30/2016   Suicidal ideation 10/30/2016   Major depressive disorder, 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   Rash and nonspecific skin eruption 01/10/2014   Otitis externa of both ears 01/10/2014   Acute pharyngitis 06/07/2013   Right groin pain 05/27/2012   CONTACT DERMATITIS 12/12/2008   ALLERGIC RHINITIS 01/11/2008   ATTENTION DEFICIT, W/HYPERACTIVITY 06/03/2006    Past Surgical History:  Procedure Laterality Date   INGUINAL HERNIA REPAIR  03/21/09   Repair of R inguinal hernia with hydrocele       Family History  Problem Relation Age of Onset   Healthy Mother    Healthy Father     Social History   Tobacco Use   Smoking status: Every Day    Packs/day: 0.50    Pack years: 0.00    Types: Cigarettes   Smokeless tobacco: Never  Vaping Use   Vaping Use: Never used   Substance Use Topics   Alcohol use: Yes    Comment: OCCASIONAL   Drug use: Yes    Frequency: 4.0 times per week    Types: Marijuana    Comment: marijuana---"4 blunts a day"    Home Medications Prior to Admission medications   Medication Sig Start Date End Date Taking? Authorizing Provider  amoxicillin-clavulanate (AUGMENTIN) 875-125 MG tablet Take 1 tablet by mouth every 12 (twelve) hours. 06/20/20   Rhys MartiniGraham, Laura E, PA-C  ibuprofen (ADVIL) 600 MG tablet Take 1 tablet (600 mg total) by mouth every 6 (six) hours as needed for moderate pain. 09/06/20   Fayrene Helperran, Bowie, PA-C    Allergies    Patient has no known allergies.  Review of Systems   Review of Systems  Constitutional:  Positive for appetite change, fatigue and fever. Negative for chills.       Nothing tastes good to him  HENT:  Negative for ear pain and sore throat.   Eyes:  Negative for pain and visual disturbance.  Respiratory:  Negative for cough and shortness of breath.   Cardiovascular:  Negative for chest pain and palpitations.  Gastrointestinal:  Positive for abdominal pain. Negative for diarrhea, nausea and vomiting.  Genitourinary:  Positive for dysuria. Negative for frequency, hematuria and scrotal swelling.  Musculoskeletal:  Negative for arthralgias and back pain.  Skin:  Negative for color change and rash.  Neurological:  Negative for seizures and syncope.  All other systems reviewed and are negative.  Physical Exam Updated Vital Signs BP 132/74 (BP Location: Right Arm)   Pulse 76   Temp 98.6 F (37 C) (Oral)   Resp 18   SpO2 98%   Physical Exam Vitals and nursing note reviewed.  Constitutional:      Appearance: Normal appearance. He is diaphoretic.  HENT:     Head: Normocephalic and atraumatic.  Eyes:     Conjunctiva/sclera: Conjunctivae normal.  Pulmonary:     Effort: Pulmonary effort is normal. No respiratory distress.  Abdominal:     General: There is no distension.     Tenderness: There is no  abdominal tenderness. There is no guarding.  Genitourinary:    Comments: 2 small ulcers at the coccyx; rectum is normal. No palpable abscess. No prostate tenderness Musculoskeletal:        General: No deformity. Normal range of motion.     Cervical back: Normal range of motion.  Skin:    General: Skin is warm.  Neurological:     General: No focal deficit present.     Mental Status: He is alert and oriented to person, place, and time. Mental status is at baseline.  Psychiatric:        Mood and Affect: Mood normal.    ED Results / Procedures / Treatments   Labs (all labs ordered are listed, but only abnormal results are displayed) Labs Reviewed  CBC WITH DIFFERENTIAL/PLATELET - Abnormal; Notable for the following components:      Result Value   Hemoglobin 12.3 (*)    HCT 38.1 (*)    MCV 76.8 (*)    MCH 24.8 (*)    All other components within normal limits  COMPREHENSIVE METABOLIC PANEL - Abnormal; Notable for the following components:   Sodium 134 (*)    Potassium 3.4 (*)    Glucose, Bld 109 (*)    All other components within normal limits  URINALYSIS, ROUTINE W REFLEX MICROSCOPIC - Abnormal; Notable for the following components:   Color, Urine YELLOW (*)    APPearance CLEAR (*)    Bilirubin Urine SMALL (*)    Ketones, ur TRACE (*)    Protein, ur 300 (*)    Bacteria, UA RARE (*)    All other components within normal limits  RAPID HIV SCREEN (HIV 1/2 AB+AG) - Abnormal; Notable for the following components:   HIV 1/2 Antibodies Reactive (*)    All other components within normal limits  RESP PANEL BY RT-PCR (FLU A&B, COVID) ARPGX2  RPR  HIV-1/2 AB - DIFFERENTIATION  T-HELPER CELLS (CD4) COUNT (NOT AT [facility])  HIV-1 RNA QUANT-NO REFLEX-BLD  GC/CHLAMYDIA PROBE AMP ([place]) NOT AT [facility]    EKG None  Radiology CT ABDOMEN PELVIS W CONTRAST  Result Date: 09/20/2020 CLINICAL DATA:  Diarrhea and lower abdominal pain EXAM: CT ABDOMEN AND PELVIS WITH CONTRAST TECHNIQUE:  Multidetector CT imaging of the abdomen and pelvis was performed using the standard protocol following bolus administration of intravenous contrast. CONTRAST:  OMNIPAQUE IOHEXOL 300 MG/ML  SOLN COMPARISON:  None. FINDINGS: Lower chest: No acute abnormality. Hepatobiliary: No focal liver abnormality is seen. No gallstones, gallbladder wall thickening, or biliary dilatation. Pancreas: Unremarkable. No pancreatic ductal dilatation or surrounding inflammatory changes. Spleen: Unremarkable. Adrenals/Urinary Tract: Adrenals, kidneys, and partially distended bladder are unremarkable. Stomach/Bowel: Stomach is within normal limits. Bowel is normal in caliber and decompressed. Mild colonic wall thickening is favored to be due to under distension. Appendix measures top  normal in caliber. There are no surrounding inflammatory changes. Vascular/Lymphatic: No significant vascular abnormality on this noncontrast study. No enlarged lymph nodes identified Reproductive: Unremarkable. Other: No free fluid.  Abdominal wall is unremarkable. Musculoskeletal: No acute or significant osseous abnormality. IMPRESSION: No definite acute abnormality. Mild colonic wall thickening is favored to be due to under distension which could be related to colitis. Electronically Signed   By: Guadlupe Spanish M.D.   On: 09/20/2020 10:34    Procedures Procedures   Medications Ordered in ED Medications  sodium chloride (PF) 0.9 % injection (has no administration in time range)  bictegravir-emtricitabine-tenofovir AF (BIKTARVY) 50-200-25 MG per tablet 1 tablet (1 tablet Oral Given 09/20/20 1138)  iohexol (OMNIPAQUE) 300 MG/ML solution 100 mL (100 mLs Intravenous Contrast Given 09/20/20 1010)  cefTRIAXone (ROCEPHIN) injection 500 mg (500 mg Intramuscular Given 09/20/20 1141)  doxycycline (VIBRA-TABS) tablet 100 mg (100 mg Oral Given 09/20/20 1141)  lidocaine (XYLOCAINE) 2 % (with pres) injection (400 mg  Given 09/20/20 1141)    ED Course  I  have reviewed the triage vital signs and the nursing notes.  Pertinent labs & imaging results that were available during my care of the patient were reviewed by me and considered in my medical decision making (see chart for details).  Clinical Course as of 09/20/20 1312  Fri Sep 20, 2020  1137 I spoke with Aram Beecham of infectious disease.  She plans to make an in person consult.  I spoke with the patient and his mother.  The patient did give me permission to talk to his mother.  He was quite upset about this diagnosis.  I did offer her behavioral health counseling.  He declined at the moment. [AW]  1138 Will treat empirically for other STI.  [AW]  1309 He was seen by ID and will be able to be followed in clinic Monday June 20. Meds provided here. [AW]    Clinical Course User Index [AW] Koleen Distance, MD   MDM Rules/Calculators/A&P                          Jerry Johnson presents to the ED with 5weeks subjective fevers, rectal pain, and malaise.  He was evaluated for any source of infection such as COVID-19, intra-abdominal rectal abscess, and sexually transmitted infections.  His HIV test came back positive, and it is likely that he has HIV.  Further tests are pending, and he has follow-up on Monday with infectious diseases.  He was given medication to start here.  I have also empirically treated her for gonorrhea and chlamydia.  Those tests are pending as well as an RPR.  The patient's mother was with him, and she was offering him psychosocial support.  I did offer counseling for him, and he declined. Final Clinical Impression(s) / ED Diagnoses Final diagnoses:  Symptomatic HIV infection (HCC)    Rx / DC Orders ED Discharge Orders          Ordered    bictegravir-emtricitabine-tenofovir AF (BIKTARVY) 50-200-25 MG TABS tablet  Daily       Note to Pharmacy: Margarite Gouge (Cell # [phone]) will p/u from pharmacy today- can text/call whenever See TOC benefit check note by Carolyne Fiscal  @ 11:31 today for [employer] information   09/20/20 1145    ondansetron (ZOFRAN ODT) 4 MG disintegrating tablet  Every 8 hours PRN        09/20/20 1312    doxycycline (  VIBRAMYCIN) 100 MG capsule  2 times daily        09/20/20 1312             Koleen Distance, MD 09/20/20 [phone]

## 2020-09-23 ENCOUNTER — Ambulatory Visit (INDEPENDENT_AMBULATORY_CARE_PROVIDER_SITE_OTHER): Payer: Self-pay | Admitting: Family

## 2020-09-23 ENCOUNTER — Ambulatory Visit (INDEPENDENT_AMBULATORY_CARE_PROVIDER_SITE_OTHER): Payer: Self-pay | Admitting: Pharmacist

## 2020-09-23 ENCOUNTER — Other Ambulatory Visit: Payer: Self-pay

## 2020-09-23 ENCOUNTER — Encounter: Payer: Self-pay | Admitting: Family

## 2020-09-23 ENCOUNTER — Other Ambulatory Visit (HOSPITAL_COMMUNITY): Payer: Self-pay

## 2020-09-23 ENCOUNTER — Telehealth: Payer: Self-pay

## 2020-09-23 VITALS — BP 109/73 | HR 73 | Temp 97.8°F | Wt 148.0 lb

## 2020-09-23 DIAGNOSIS — B20 Human immunodeficiency virus [HIV] disease: Secondary | ICD-10-CM

## 2020-09-23 DIAGNOSIS — Z113 Encounter for screening for infections with a predominantly sexual mode of transmission: Secondary | ICD-10-CM

## 2020-09-23 DIAGNOSIS — Z79899 Other long term (current) drug therapy: Secondary | ICD-10-CM

## 2020-09-23 NOTE — Patient Instructions (Signed)
[name].  We will check your lab work today.   Continue current dose of Biktarvy.  We will plan to follow up in 1 month or sooner if needed.   Schedule an appointment with Marylu Lund for counseling if interested.  We will get you connected with [facility] to help with available resources.  Let Jerry Johnson know if you have any questions.

## 2020-09-23 NOTE — Progress Notes (Signed)
Brief Narrative   Patient ID: Jerry Johnson, male    DOB: 11-01-1990, 30 y.o.   MRN: [id number]  Jerry Johnson is a 30 y/o [place] American male newly diagnosed with HIV disease risk factor of bisexual contact. No history of opportunistic infection. CD4 nadir of 220 with viral load and geontype pending. Initial ART regimen of Biktarvy.   Subjective:    Chief Complaint  Patient presents with   New Patient (Initial Visit)    Pt is new patient , pt isn 't  having any pain , wants a referral to [facility] wellness for pcp      HPI:  Jerry Johnson is a 30 y.o. male with previous medical history of depression and ADHD presents today to establish care for newly diagnosed HIV disease.   Jerry Johnson was recently seen in the emergency room at [facility] with 4-week history of malaise, fevers, and loss of appetite.  Tested positive for HIV.  Last negative HIV test was in mid March.  Risk factor for HIV is bisexual contact. Initial CD4 count was 220 with viral load that is pending. Started on [place] and scheduled to establish care for continued treatment.  Jerry Johnson is here today with his mother present with his permission. Has been taking the [place] since he was at the hospital with no significant adverse side effects. Appetite has improved since starting medication. Feeling okay today. Denies fevers, chills, night sweats, headaches, changes in vision, neck pain/stiffness, nausea, diarrhea, vomiting, lesions or rashes.  Jerry Johnson works for [employer] and has not been able to work the past 3-4 weeks secondary to illness. Currently has no insurance and has been approved for Advancing Access. Smokes marijuana daily with occasional alcohol consumption and about 0.5 packs per cigarettes per day. Has not told anyone other than his mother about his diagnosis.   No Known Allergies    Outpatient Medications Prior to Visit  Medication Sig  Dispense Refill   bictegravir-emtricitabine-tenofovir AF (BIKTARVY) 50-200-25 MG TABS tablet Take 1 tablet by mouth daily. 30 tablet 0   doxycycline (VIBRAMYCIN) 100 MG capsule Take 1 capsule (100 mg total) by mouth 2 (two) times daily for 7 days. 14 capsule 0   ibuprofen (ADVIL) 600 MG tablet Take 1 tablet (600 mg total) by mouth every 6 (six) hours as needed for moderate pain. 30 tablet 0   ondansetron (ZOFRAN ODT) 4 MG disintegrating tablet Take 1 tablet (4 mg total) by mouth every 8 (eight) hours as needed for nausea or vomiting. 20 tablet 0   No facility-administered medications prior to visit.     Past Medical History:  Diagnosis Date   Depression    UNSPECIFIED HYDROCELE 03/12/2009   Qualifier: Diagnosis of  By: Netty Starring  MD, Lucianne Muss       Past Surgical History:  Procedure Laterality Date   INGUINAL HERNIA REPAIR  03/21/09   Repair of R inguinal hernia with hydrocele       Review of Systems  Constitutional:  Negative for appetite change, chills, fatigue, fever and unexpected weight change.  Eyes:  Negative for visual disturbance.  Respiratory:  Negative for cough, chest tightness, shortness of breath and wheezing.   Cardiovascular:  Negative for chest pain and leg swelling.  Gastrointestinal:  Negative for abdominal pain, constipation, diarrhea, nausea and vomiting.  Genitourinary:  Negative for dysuria, flank pain, frequency, genital sores, hematuria and urgency.  Skin:  Negative for rash.  Allergic/Immunologic: Negative for immunocompromised  state.  Neurological:  Negative for dizziness and headaches.     Objective:    BP 109/73   Pulse 73   Temp 97.8 F (36.6 C)   Wt 148 lb (67.1 kg)   SpO2 100%   BMI 20.64 kg/m  Nursing note and vital signs reviewed.  Physical Exam Constitutional:      General: He is not in acute distress.    Appearance: He is well-developed.  Eyes:     Conjunctiva/sclera: Conjunctivae normal.  Cardiovascular:     Rate and Rhythm:  Normal rate and regular rhythm.     Heart sounds: Normal heart sounds. No murmur heard.   No friction rub. No gallop.  Pulmonary:     Effort: Pulmonary effort is normal. No respiratory distress.     Breath sounds: Normal breath sounds. No wheezing or rales.  Chest:     Chest wall: No tenderness.  Abdominal:     General: Bowel sounds are normal.     Palpations: Abdomen is soft.     Tenderness: There is no abdominal tenderness.  Musculoskeletal:     Cervical back: Neck supple.  Lymphadenopathy:     Cervical: No cervical adenopathy.  Skin:    General: Skin is warm and dry.     Findings: No rash.  Neurological:     Mental Status: He is alert and oriented to person, place, and time.  Psychiatric:        Mood and Affect: Mood is depressed. Affect is flat.        Behavior: Behavior normal.        Thought Content: Thought content normal.        Judgment: Judgment normal.     Depression screen [facility] 2/9 09/23/2020 06/07/2013  Decreased Interest 0 0  Down, Depressed, Hopeless 0 0  PHQ - 2 Score 0 0       Assessment & Plan:    Patient Active Problem List   Diagnosis Date Noted   HIV disease ([place]) 09/23/2020   Adjustment disorder with mixed anxiety and depressed mood 10/30/2016   Suicidal ideation 10/30/2016   Major depressive disorder, recurrent severe without psychotic features ([place]) 10/29/2016   Rash and nonspecific skin eruption 01/10/2014   Otitis externa of both ears 01/10/2014   Acute pharyngitis 06/07/2013   Right groin pain 05/27/2012   CONTACT DERMATITIS 12/12/2008   ALLERGIC RHINITIS 01/11/2008   ATTENTION DEFICIT, W/HYPERACTIVITY 06/03/2006     Problem List Items Addressed This Visit       Other   HIV disease ([place]) - Primary    Jerry Johnson is a 30 y/o [place] American male newly diagnosed with HIV disease with last negative test in mid-March and risk factor of bisexual contact. No history of opportunistic infection although appears as he may have had acute  retroviral syndrome. CD4 nadir of 220 with viral load and geontype pending. Appears to be tolerating Biktarvy. Discussed the basic information of HIV including transmission, monitoring, treatment, and risks of progression. Likely would benefit from session with counseling. Will speak with financial counselor and pharmacy today. Send referral to [facility] for assistance. Continue current dose of Biktarvy. Plan for follow up in 1 month or sooner if needed.        Relevant Orders   HEPATITIS B SURFACE ANTIGEN   HEPATITIS B SURFACE ANTIBODY   HEPATITIS B CORE ANTIBODY, TOTAL   HEPATITIS C ANTIBODY   HEPATITIS A ANTIBODY, TOTAL   HIV-1 RNA ULTRAQUANT REFLEX TO GENTYP+  HLA B*5701   QuantiFERON-TB Gold Plus   Urine cytology ancillary only([place])   Other Visit Diagnoses     Screening for STDs (sexually transmitted diseases)       Relevant Orders   Urine cytology ancillary only([place])   Pharmacologic therapy       Relevant Orders   LIPID PANEL        I am having Jerry Johnson maintain his ibuprofen, Biktarvy, ondansetron, and doxycycline.   Follow-up: Return in about 1 month (around 10/23/2020), or if symptoms worsen or fail to improve.   Terri Piedra, MSN, FNP-C Nurse Practitioner [facility] for [facility] Disease [place] number: [phone]

## 2020-09-23 NOTE — Progress Notes (Signed)
HPI: Jerry Johnson is a 30 y.o. male who presents to the [facility] pharmacy clinic for HIV follow-up.  Patient Active Problem List   Diagnosis Date Noted   Adjustment disorder with mixed anxiety and depressed mood 10/30/2016   Suicidal ideation 10/30/2016   Major depressive disorder, 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   Rash and nonspecific skin eruption 01/10/2014   Otitis externa of both ears 01/10/2014   Acute pharyngitis 06/07/2013   Right groin pain 05/27/2012   CONTACT DERMATITIS 12/12/2008   ALLERGIC RHINITIS 01/11/2008   ATTENTION DEFICIT, W/HYPERACTIVITY 06/03/2006    Patient's Medications  New Prescriptions   No medications on file  Previous Medications   BICTEGRAVIR-EMTRICITABINE-TENOFOVIR AF (BIKTARVY) 50-200-25 MG TABS TABLET    Take 1 tablet by mouth daily.   DOXYCYCLINE (VIBRAMYCIN) 100 MG CAPSULE    Take 1 capsule (100 mg total) by mouth 2 (two) times daily for 7 days.   IBUPROFEN (ADVIL) 600 MG TABLET    Take 1 tablet (600 mg total) by mouth every 6 (six) hours as needed for moderate pain.   ONDANSETRON (ZOFRAN ODT) 4 MG DISINTEGRATING TABLET    Take 1 tablet (4 mg total) by mouth every 8 (eight) hours as needed for nausea or vomiting.  Modified Medications   No medications on file  Discontinued Medications   No medications on file    Allergies: No Known Allergies  Past Medical History: Past Medical History:  Diagnosis Date   Depression    UNSPECIFIED HYDROCELE 03/12/2009   Qualifier: Diagnosis of  By: Burnadette Pop  MD, Trisha Mangle      Social History: Social History   Socioeconomic History   Marital status: Single    Spouse name: Not on file   Number of children: Not on file   Years of education: Not on file   Highest education level: Not on file  Occupational History   Not on file  Tobacco Use   Smoking status: Every Day    Packs/day: 0.50    Pack years: 0.00    Types: Cigarettes   Smokeless tobacco: Never  Vaping  Use   Vaping Use: Never used  Substance and Sexual Activity   Alcohol use: Yes    Comment: OCCASIONAL   Drug use: Yes    Frequency: 4.0 times per week    Types: Marijuana    Comment: marijuana---"4 blunts a day"   Sexual activity: Not on file  Other Topics Concern   Not on file  Social History Narrative   Not on file   Social Determinants of Health   Financial Resource Strain: Not on file  Food Insecurity: Not on file  Transportation Needs: Not on file  Physical Activity: Not on file  Stress: Not on file  Social Connections: Not on file    Labs: Lab Results  Component Value Date   CD4TABS 220 (L) 09/20/2020    RPR and STI Lab Results  Component Value Date   LABRPR NON REACTIVE 09/20/2020   LABRPR NON REACTIVE 06/22/2020   LABRPR NON REAC 12/12/2008    STI Results GC CT  06/22/2020 Negative Negative  02/12/2015 Negative **POSITIVE**(A)    Hepatitis B No results found for: HEPBSAB, HEPBSAG, HEPBCAB Hepatitis C No results found for: HEPCAB, HCVRNAPCRQN Hepatitis A No results found for: HAV Lipids: No results found for: CHOL, TRIG, HDL, CHOLHDL, VLDL, LDLCALC  Current HIV Regimen: Biktarvy  Assessment: Hussain presents to establish care for HIV, also being seen by Tammy Sours today. He  was diagnosed with HIV on 09/20/20 in the ED after having fevers and malaise x 5 weeks and was started on Biktarvy. Given ceftriaxone empirically in the ED for gonorrhea, also being treated empirically with doxycycline for chlamydia. Reports no missed doses since he started Biktarvy on 6/17. He has a 30 day supply and has taken 4 doses so far. Endorses having some headaches but no other adverse effects at this time.   He is accompanied by his mother today who is very supportive and engaged in asking questions. Reports that he had a panic attack on 6/17 and she gave him a Xanax to calm him down. He will start counseling with Marylu Lund. She asks what he can take for his headaches as needed,  counseled that he can take Tylenol or ibuprofen and neither of these would interact with his Biktarvy. She states that he has not had much of an appetite lately but he says it has started to come back since starting medication. Expect that his appetite will continue to improve as he continues on Biktarvy.   Plan:  -Biktarvy 1 tablet daily  -Counseled patient on importance of adherence to [place], to take at the same time every day with or without food. Can try taking it with food if he experiences nausea or stomach upset. -Educated about drug interactions with Mg/Ca/Fe/Al containing supplements or vitamins. His mother states he will start a multivitamin but he will separate it from [place] by taking one of them in the morning and one in the evening.  -Encouraged patient and mother to reach out if any questions arise or if they have any issues obtaining medication or need samples while HMAP application is pending  Pervis Hocking, PharmD PGY1 Pharmacy Resident 09/23/2020 3:00 PM

## 2020-09-23 NOTE — Assessment & Plan Note (Signed)
Jerry Johnson is a 30 y/o [place] American male newly diagnosed with HIV disease with last negative test in mid-March and risk factor of bisexual contact. No history of opportunistic infection although appears as he may have had acute retroviral syndrome. CD4 nadir of 220 with viral load and geontype pending. Appears to be tolerating Biktarvy. Discussed the basic information of HIV including transmission, monitoring, treatment, and risks of progression. Likely would benefit from session with counseling. Will speak with financial counselor and pharmacy today. Send referral to [facility] for assistance. Continue current dose of Biktarvy. Plan for follow up in 1 month or sooner if needed.

## 2020-09-23 NOTE — Telephone Encounter (Signed)
[facility] Patient Advocate Encounter  Completed and sent [employer] Advancing Access application for Biktarvy for this patient who is uninsured.    Patient is approved 09/20/20 through 09/20/21.  BIN      [id number] PCN    [id number] [id number]    [id number] ID        [id number]   Clearance Coots, CPhT Specialty Pharmacy Patient [facility] Phone: [phone] Fax:  [phone]

## 2020-09-24 LAB — HIV-1/HIV-2 QUALITATIVE RNA
HIV-1 RNA, Qualitative: REACTIVE — AB
HIV-2 RNA, Qualitative: NONREACTIVE

## 2020-09-24 LAB — HIV-1/2 AB - DIFFERENTIATION
HIV 1 Ab: NONREACTIVE
HIV 2 Ab: NONREACTIVE
Note: NEGATIVE

## 2020-09-24 LAB — HIV-1 RNA QUANT-NO REFLEX-BLD

## 2020-09-25 LAB — URINE CYTOLOGY ANCILLARY ONLY
Chlamydia: NEGATIVE
Comment: NEGATIVE
Comment: NORMAL
Neisseria Gonorrhea: NEGATIVE

## 2020-09-26 ENCOUNTER — Encounter: Payer: Self-pay | Admitting: Family

## 2020-09-26 ENCOUNTER — Ambulatory Visit: Payer: Self-pay

## 2020-09-30 ENCOUNTER — Encounter: Payer: Self-pay | Admitting: Family

## 2020-09-30 ENCOUNTER — Telehealth: Payer: Self-pay

## 2020-09-30 NOTE — Telephone Encounter (Signed)
Contacted patient's mother to discuss doctor's note. RN informed her that provider is out of the office but she would reach out to see if he would write note. Patient is a newly diagnosed B20; reports being out of work prior to diagnosis and since because of feeling poorly. Forwarding to provider to review.   Merlin Golden Loyola Mast, RN

## 2020-09-30 NOTE — Telephone Encounter (Signed)
Patient called requesting a letter to return back to work. He is a newly diagnosed B20 and said he wasn't able to function well but is ready to go back to work. Needs letter to cover dates 08/11/20 - present, would like to return today and I have informed the patient his Provider Carver Fila was not in clinic today. Best contact number [phone] mothers phone Elic Vencill, patient said it was ok to ask for him.

## 2020-09-30 NOTE — Telephone Encounter (Signed)
I can write a broad, generic note for his absence. Do we know if he has gone back (or plans to go back) to work?

## 2020-10-08 ENCOUNTER — Other Ambulatory Visit: Payer: Self-pay

## 2020-10-08 ENCOUNTER — Ambulatory Visit (INDEPENDENT_AMBULATORY_CARE_PROVIDER_SITE_OTHER): Payer: Self-pay

## 2020-10-08 DIAGNOSIS — Z23 Encounter for immunization: Secondary | ICD-10-CM

## 2020-10-08 NOTE — Progress Notes (Signed)
   Covid-19 Vaccination Clinic  Name:  Jerry Johnson    MRN: [id number] DOB: [birth year]  10/08/2020  Mr. [name] was observed post Covid-19 immunization for 15 minutes without incident. He was provided with Vaccine Information Sheet and instruction to access the V-Safe system.   Mr. Chance was instructed to call 911 with any severe reactions post vaccine: Difficulty breathing  Swelling of face and throat  A fast heartbeat  A bad rash all over body  Dizziness and weakness   Immunizations Administered     Name Date Dose VIS Date Route   PFIZER Comrnaty(Gray TOP) Covid-19 Vaccine 10/08/2020  2:12 PM 0.3 mL 03/14/2020 Intramuscular   Manufacturer: [employer], [employer]   Lot: [id number]   NDC: [phone]

## 2020-10-14 ENCOUNTER — Other Ambulatory Visit: Payer: Self-pay

## 2020-10-14 ENCOUNTER — Telehealth: Payer: Self-pay

## 2020-10-14 ENCOUNTER — Other Ambulatory Visit (HOSPITAL_COMMUNITY): Payer: Self-pay

## 2020-10-14 ENCOUNTER — Other Ambulatory Visit: Payer: Self-pay | Admitting: Family

## 2020-10-14 DIAGNOSIS — R110 Nausea: Secondary | ICD-10-CM

## 2020-10-14 MED ORDER — ONDANSETRON 4 MG PO TBDP: 4.0000 mg | ORAL_TABLET | Freq: Three times a day (TID) | ORAL | 0 refills | Status: DC | PRN

## 2020-10-14 MED ORDER — BIKTARVY 50-200-25 MG PO TABS: 1.0000 | ORAL_TABLET | Freq: Every day | ORAL | 3 refills | Status: DC

## 2020-10-14 MED ORDER — BIKTARVY 50-200-25 MG PO TABS: 1.0000 | ORAL_TABLET | Freq: Every day | ORAL | 0 refills | Status: DC

## 2020-10-14 MED ORDER — ONDANSETRON 4 MG PO TBDP
4.0000 mg | ORAL_TABLET | Freq: Three times a day (TID) | ORAL | 0 refills | Status: DC | PRN
  Filled 2020-10-14: qty 20, 7d supply, fill #0

## 2020-10-14 NOTE — Progress Notes (Signed)
ADAP approved. Medication transferred to [facility].  Valarie Cones

## 2020-10-14 NOTE — Telephone Encounter (Signed)
Patient's mom given 1 bottle of Biktarvy samples to bridge him until his appointment on 7/18 with Marcos Eke. Also requested refill on zofran. Medication sent to [facility] pharmacy. She was reminded of appointment on the 18th with provider.   Murlean Seelye Loyola Mast, RN

## 2020-10-16 LAB — LIPID PANEL
Cholesterol: 144 mg/dL (ref <200)
HDL: 23 mg/dL — ABNORMAL LOW (ref >=40)
LDL Cholesterol (Calc): 94 mg/dL (calc)
Non-HDL Cholesterol (Calc): 121 mg/dL (calc) (ref <130)
Total CHOL/HDL Ratio: 6.3 (calc) — ABNORMAL HIGH (ref <5.0)
Triglycerides: 172 mg/dL — ABNORMAL HIGH (ref <150)

## 2020-10-16 LAB — HEPATITIS B SURFACE ANTIBODY,QUALITATIVE: Hep B S Ab: NONREACTIVE

## 2020-10-16 LAB — QUANTIFERON-TB GOLD PLUS
Mitogen-NIL: 5.64 IU/mL
NIL: 0.04 IU/mL
QuantiFERON-TB Gold Plus: NEGATIVE
TB1-NIL: 0.03 IU/mL
TB2-NIL: 0.02 IU/mL

## 2020-10-16 LAB — HIV-1 RNA ULTRAQUANT REFLEX TO GENTYP+
HIV 1 RNA Quant: >10000000 copies/mL — ABNORMAL HIGH
HIV-1 RNA Quant, Log: >7 Log copies/mL — ABNORMAL HIGH

## 2020-10-16 LAB — HEPATITIS B SURFACE ANTIGEN: Hepatitis B Surface Ag: NONREACTIVE

## 2020-10-16 LAB — HIV-1 GENOTYPE: HIV-1 Genotype: DETECTED — AB

## 2020-10-16 LAB — HEPATITIS A ANTIBODY, TOTAL: Hepatitis A AB,Total: NONREACTIVE

## 2020-10-16 LAB — HEPATITIS C ANTIBODY
Hepatitis C Ab: NONREACTIVE
SIGNAL TO CUT-OFF: 0.04 (ref <1.00)

## 2020-10-16 LAB — HLA B*5701: HLA-B*5701 w/rflx HLA-B High: NEGATIVE

## 2020-10-16 LAB — HEPATITIS B CORE ANTIBODY, TOTAL: Hep B Core Total Ab: NONREACTIVE

## 2020-10-21 ENCOUNTER — Encounter: Payer: Self-pay | Admitting: Family

## 2020-10-21 ENCOUNTER — Other Ambulatory Visit: Payer: Self-pay

## 2020-10-21 ENCOUNTER — Ambulatory Visit: Payer: Medicaid Other

## 2020-10-21 ENCOUNTER — Ambulatory Visit (INDEPENDENT_AMBULATORY_CARE_PROVIDER_SITE_OTHER): Payer: Medicaid Other | Admitting: Family

## 2020-10-21 VITALS — BP 121/85 | HR 80 | Temp 97.9°F | Resp 16 | Ht 71.0 in | Wt 151.2 lb

## 2020-10-21 DIAGNOSIS — F332 Major depressive disorder, recurrent severe without psychotic features: Secondary | ICD-10-CM

## 2020-10-21 DIAGNOSIS — B20 Human immunodeficiency virus [HIV] disease: Secondary | ICD-10-CM

## 2020-10-21 MED ORDER — BIKTARVY 50-200-25 MG PO TABS: 1.0000 | ORAL_TABLET | Freq: Every day | ORAL | 4 refills | Status: DC

## 2020-10-21 NOTE — Patient Instructions (Addendum)
Nice to see you.  We will continue your Biktarvy daily.  Follow up with Marylu Lund next weeks as scheduled.   Refills are at [facility] on [address] and [address].  Plan for follow up in 3 weeks.  Hope you get to feeling better.

## 2020-10-21 NOTE — Progress Notes (Signed)
Brief Narrative   Patient ID: Jerry Johnson, male    DOB: [birth year], 30 y.o.   MRN: [id number]  Jerry Johnson is a 30 y/o AA diagnosed with HIV on 09/20/20 with risk factor of bisexual contact. Genotype with Subtype B and no significant resistance. No history of opportunistic infection. Initial CD4 count of 220 and viral load >10 million likely reflecting acute infection. Enters care at [facility] Stage 2. Sole ART regimen of Biktarvy.   Subjective:    Chief Complaint  Patient presents with   Follow-up    HPI:  Jerry Johnson is a 30 y.o. male with HIV disease last seen on 09/23/2020 for initial clinic visit following emergency room visit with apparent acute HIV with viral load greater than 10 million and CD4 count of 220.  Genotype with no significant resistance patterns.  Started on [place].  Here today for initial follow-up.  Jerry Johnson is here with his mother with his permission.  He has been taking his Biktarvy daily.  Has not been sleeping well and for the last 3 days has developed nausea/vomiting with decreased ability to hold things down.  Denies any fevers, chills, headaches, rashes, or lesions.  Jerry Johnson is now approved for UMAP.  Has feelings of being down and depressed and has not really left his room. Not able to work secondary to weakness. No suicidal ideation. Uses marijuana with occasional alcohol and about 0.5 pack of cigarettes per day. Mother is assisting in his application for disability.   No Known Allergies    Outpatient Medications Prior to Visit  Medication Sig Dispense Refill   ibuprofen (ADVIL) 600 MG tablet Take 1 tablet (600 mg total) by mouth every 6 (six) hours as needed for moderate pain. 30 tablet 0   ondansetron (ZOFRAN ODT) 4 MG disintegrating tablet Dissolve 1 tablet (4 mg total) by mouth every 8 (eight) hours as needed for nausea or vomiting. 20 tablet 0   bictegravir-emtricitabine-tenofovir AF (BIKTARVY) 50-200-25 MG TABS  tablet Take 1 tablet by mouth daily. 30 tablet 3   No facility-administered medications prior to visit.     Past Medical History:  Diagnosis Date   Depression    UNSPECIFIED HYDROCELE 03/12/2009   Qualifier: Diagnosis of  By: Burnadette Pop  MD, Trisha Mangle       Past Surgical History:  Procedure Laterality Date   INGUINAL HERNIA REPAIR  03/21/09   Repair of R inguinal hernia with hydrocele      Review of Systems  Constitutional:  Negative for appetite change, chills, fatigue, fever and unexpected weight change.  Eyes:  Negative for visual disturbance.  Respiratory:  Negative for cough, chest tightness, shortness of breath and wheezing.   Cardiovascular:  Negative for chest pain and leg swelling.  Gastrointestinal:  Positive for nausea and vomiting. Negative for abdominal pain, constipation and diarrhea.  Genitourinary:  Negative for dysuria, flank pain, frequency, genital sores, hematuria and urgency.  Skin:  Negative for rash.  Allergic/Immunologic: Negative for immunocompromised state.  Neurological:  Positive for weakness. Negative for dizziness and headaches.  Psychiatric/Behavioral:  Positive for dysphoric mood and sleep disturbance. Negative for self-injury and suicidal ideas.      Objective:    BP 121/85   Pulse 80   Temp 97.9 F (36.6 C)   Resp 16   Ht 5\' 11"  (1.803 m)   Wt 151 lb 3.2 oz (68.6 kg)   SpO2 98%   BMI 21.09 kg/m  Nursing note and vital signs reviewed.  Physical Exam Constitutional:      General: He is not in acute distress.    Appearance: He is well-developed.  Eyes:     Conjunctiva/sclera: Conjunctivae normal.  Cardiovascular:     Rate and Rhythm: Normal rate and regular rhythm.     Heart sounds: Normal heart sounds. No murmur heard.   No friction rub. No gallop.  Pulmonary:     Effort: Pulmonary effort is normal. No respiratory distress.     Breath sounds: Normal breath sounds. No wheezing or rales.  Chest:     Chest wall: No tenderness.   Abdominal:     General: Bowel sounds are normal.     Palpations: Abdomen is soft.     Tenderness: There is no abdominal tenderness.  Musculoskeletal:     Cervical back: Neck supple.  Lymphadenopathy:     Cervical: No cervical adenopathy.  Skin:    General: Skin is warm and dry.     Findings: No rash.  Neurological:     Mental Status: He is alert and oriented to person, place, and time.  Psychiatric:        Mood and Affect: Mood is depressed. Affect is flat.     Depression screen [facility] 2/9 09/23/2020 06/07/2013  Decreased Interest 0 0  Down, Depressed, Hopeless 0 0  PHQ - 2 Score 0 0       Assessment & Plan:    Patient Active Problem List   Diagnosis Date Noted   HIV disease (HCC) 09/23/2020   Adjustment disorder with mixed anxiety and depressed mood 10/30/2016   Suicidal ideation 10/30/2016   Major depressive disorder, 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   Rash and nonspecific skin eruption 01/10/2014   Otitis externa of both ears 01/10/2014   Acute pharyngitis 06/07/2013   Right groin pain 05/27/2012   CONTACT DERMATITIS 12/12/2008   ALLERGIC RHINITIS 01/11/2008   ATTENTION DEFICIT, W/HYPERACTIVITY 06/03/2006     Problem List Items Addressed This Visit       Other   Major depressive disorder, recurrent severe without psychotic features [facility])    Jerry Johnson appears to be having exacerbation of major depressive disorder likely contributing to his weakness and overall mood.  No suicidal ideation or signs of psychosis.  He has a counseling appointment in 1 week.  We will hold off prescribing medications until after counseling visit.  Discussed importance of seeking further care if thoughts of suicide with plan develop.       HIV disease [facility]) - Primary    Jerry Johnson appears to be doing well with Biktarvy with good adherence and tolerance.  No signs/symptoms of opportunistic infection.  Reviewed previous lab work and discussed plan of  care.  It is unlikely that the symptoms he is having are related to HIV and more likely gastrointestinal virus related.  Check lab work today.  Continue current dose of Biktarvy.  Plan for follow-up in 3 weeks or sooner if needed with lab work on the same day.       Relevant Medications   bictegravir-emtricitabine-tenofovir AF (BIKTARVY) 50-200-25 MG TABS tablet   Other Relevant Orders   COMPLETE METABOLIC PANEL WITH GFR   T-helper cell (CD4)- ([facility] clinic only)   HIV-1 RNA quant-no reflex-bld     I am having Jerry Johnson maintain his ibuprofen, ondansetron, and Biktarvy.   Meds ordered this encounter  Medications   bictegravir-emtricitabine-tenofovir AF (BIKTARVY) 50-200-25 MG TABS tablet    Sig: Take 1 tablet  by mouth daily.    Dispense:  30 tablet    Refill:  4    Order Specific Question:   Supervising Provider    Answer:   Judyann Munson [[id number]]     Follow-up: Return in about 3 weeks (around 11/11/2020), or if symptoms worsen or fail to improve.   Marcos Eke, MSN, FNP-C Nurse Practitioner [facility] for Infectious Disease [facility] Main number: [phone]

## 2020-10-21 NOTE — Assessment & Plan Note (Signed)
Jerry Johnson appears to be having exacerbation of major depressive disorder likely contributing to his weakness and overall mood.  No suicidal ideation or signs of psychosis.  He has a counseling appointment in 1 week.  We will hold off prescribing medications until after counseling visit.  Discussed importance of seeking further care if thoughts of suicide with plan develop.

## 2020-10-21 NOTE — Assessment & Plan Note (Signed)
Jerry Johnson appears to be doing well with Biktarvy with good adherence and tolerance.  No signs/symptoms of opportunistic infection.  Reviewed previous lab work and discussed plan of care.  It is unlikely that the symptoms he is having are related to HIV and more likely gastrointestinal virus related.  Check lab work today.  Continue current dose of Biktarvy.  Plan for follow-up in 3 weeks or sooner if needed with lab work on the same day.

## 2020-10-22 ENCOUNTER — Other Ambulatory Visit (HOSPITAL_COMMUNITY): Payer: Self-pay

## 2020-10-22 LAB — T-HELPER CELL (CD4) - (RCID CLINIC ONLY)
CD4 % Helper T Cell: 24 % — ABNORMAL LOW (ref 33–65)
CD4 T Cell Abs: 374 /uL — ABNORMAL LOW (ref 400–1790)

## 2020-10-23 LAB — COMPLETE METABOLIC PANEL WITH GFR
AG Ratio: 0.8 (calc) — ABNORMAL LOW (ref 1.0–2.5)
ALT: 68 U/L — ABNORMAL HIGH (ref 9–46)
AST: 51 U/L — ABNORMAL HIGH (ref 10–40)
Albumin: 3.4 g/dL — ABNORMAL LOW (ref 3.6–5.1)
Alkaline phosphatase (APISO): 127 U/L (ref 36–130)
BUN: 8 mg/dL (ref 7–25)
CO2: 30 mmol/L (ref 20–32)
Calcium: 9.4 mg/dL (ref 8.6–10.3)
Chloride: 104 mmol/L (ref 98–110)
Creat: 1.09 mg/dL (ref 0.60–1.24)
Globulin: 4.4 g/dL (calc) — ABNORMAL HIGH (ref 1.9–3.7)
Glucose, Bld: 89 mg/dL (ref 65–99)
Potassium: 4.7 mmol/L (ref 3.5–5.3)
Sodium: 139 mmol/L (ref 135–146)
Total Bilirubin: 0.7 mg/dL (ref 0.2–1.2)
Total Protein: 7.8 g/dL (ref 6.1–8.1)
eGFR: 95 mL/min/{1.73_m2} (ref >=60)

## 2020-10-23 LAB — HIV-1 RNA QUANT-NO REFLEX-BLD
HIV 1 RNA Quant: 761 Copies/mL — ABNORMAL HIGH
HIV-1 RNA Quant, Log: 2.88 Log cps/mL — ABNORMAL HIGH

## 2020-10-29 ENCOUNTER — Ambulatory Visit: Payer: Medicaid Other

## 2020-11-05 ENCOUNTER — Ambulatory Visit: Payer: Medicaid Other

## 2020-11-15 ENCOUNTER — Ambulatory Visit (INDEPENDENT_AMBULATORY_CARE_PROVIDER_SITE_OTHER): Payer: Self-pay | Admitting: Family

## 2020-11-15 ENCOUNTER — Encounter: Payer: Self-pay | Admitting: Family

## 2020-11-15 ENCOUNTER — Other Ambulatory Visit: Payer: Self-pay

## 2020-11-15 VITALS — BP 122/83 | HR 75 | Temp 97.9°F | Wt 161.0 lb

## 2020-11-15 DIAGNOSIS — Z0000 Encounter for general adult medical examination without abnormal findings: Secondary | ICD-10-CM | POA: Insufficient documentation

## 2020-11-15 DIAGNOSIS — B20 Human immunodeficiency virus [HIV] disease: Secondary | ICD-10-CM

## 2020-11-15 DIAGNOSIS — F332 Major depressive disorder, recurrent severe without psychotic features: Secondary | ICD-10-CM

## 2020-11-15 MED ORDER — BIKTARVY 50-200-25 MG PO TABS: 1.0000 | ORAL_TABLET | Freq: Every day | ORAL | 4 refills | Status: DC

## 2020-11-15 NOTE — Assessment & Plan Note (Signed)
   Discussed importance of safe sexual practice to reduce risk of STI.  Condoms provided.  Declines vaccines will discuss at next office visit.

## 2020-11-15 NOTE — Patient Instructions (Signed)
Nice to see you.  We will check your lab work today.  Continue to take your medications daily.  Refills are available at the pharmacy.  Plan for follow up in 1 month or sooner if needed.   Have a great day and stay safe!

## 2020-11-15 NOTE — Assessment & Plan Note (Signed)
Jerry Johnson continues to do well with good adherence and tolerance to his ART regimen of Biktarvy.  Anticipate that he is likely undetectable at this point given viral load improvements down to 761 about 1 month ago.  No signs/symptoms of opportunistic infection.  We discussed the concept that undetectable equals an transmittable in the information associated with it.  He is actively seeking employment.  Check lab work today.  Continue current dose of Biktarvy.  Plan for follow-up in 1 month or sooner if needed.

## 2020-11-15 NOTE — Assessment & Plan Note (Signed)
Jerry Johnson is feeling better and oral intake is improved and is seeking employment.  Still having depressive symptoms at times and denies suicidal ideations.  There are no signs of psychosis.  May benefit from counseling although appears to be recovering slowly.  There is no indication for medication at this time.  Continue to monitor.

## 2020-11-15 NOTE — Progress Notes (Signed)
Brief Narrative   Patient ID: Jerry Johnson, male    DOB: 03/23/1991, 30 y.o.   MRN: [id number]  Mr. Macho is a 30 y/o AA diagnosed with HIV on 09/20/20 with risk factor of bisexual contact. Genotype with Subtype B and no significant resistance. No history of opportunistic infection. Initial CD4 count of 220 and viral load >10 million likely reflecting acute infection. Enters care at [facility] Stage 2. Sole ART regimen of Biktarvy.   Subjective:    Chief Complaint  Patient presents with   Follow-up    B20 - pt reports feeling a little better since last visit.     HPI:  Jerry Johnson is a 30 y.o. male with HIV disease last seen on 10/21/2020 with improved viral load and CD4 count and good adherence and tolerance to his ART regimen of Biktarvy.  Viral load improved from greater than 10,000,000 down to 761 and CD4 count improved from 220 up to 374.  Here today for routine follow-up.  Mr. Staron continues to take his Biktarvy daily as prescribed with no adverse side effects or missed doses.  Overall feeling better than previous and has gained weight since his last office visit.  Now up to 161 pounds.  He is eating better and is seeking employment. Denies fevers, chills, night sweats, headaches, changes in vision, neck pain/stiffness, nausea, diarrhea, vomiting, lesions or rashes.  Mr. Cudworth has no problems obtaining medication from the pharmacy and remains covered through [employer].  Feelings of depression have improved although still persists.  No suicidal ideations.  Smokes marijuana and tobacco with no alcohol consumption.  Condoms offered and provided.   No Known Allergies   Outpatient Medications Prior to Visit  Medication Sig Dispense Refill   ibuprofen (ADVIL) 600 MG tablet Take 1 tablet (600 mg total) by mouth every 6 (six) hours as needed for moderate pain. 30 tablet 0   bictegravir-emtricitabine-tenofovir AF (BIKTARVY) 50-200-25 MG TABS tablet Take 1 tablet  by mouth daily. 30 tablet 4   ondansetron (ZOFRAN ODT) 4 MG disintegrating tablet Dissolve 1 tablet (4 mg total) by mouth every 8 (eight) hours as needed for nausea or vomiting. (Patient not taking: Reported on 11/15/2020) 20 tablet 0   No facility-administered medications prior to visit.     Past Medical History:  Diagnosis Date   Depression    UNSPECIFIED HYDROCELE 03/12/2009   Qualifier: Diagnosis of  By: Burnadette Pop  MD, Trisha Mangle       Past Surgical History:  Procedure Laterality Date   INGUINAL HERNIA REPAIR  03/21/09   Repair of R inguinal hernia with hydrocele      Review of Systems  Constitutional:  Negative for appetite change, chills, fatigue, fever and unexpected weight change.  Eyes:  Negative for visual disturbance.  Respiratory:  Negative for cough, chest tightness, shortness of breath and wheezing.   Cardiovascular:  Negative for chest pain and leg swelling.  Gastrointestinal:  Negative for abdominal pain, constipation, diarrhea, nausea and vomiting.  Genitourinary:  Negative for dysuria, flank pain, frequency, genital sores, hematuria and urgency.  Skin:  Negative for rash.  Allergic/Immunologic: Negative for immunocompromised state.  Neurological:  Negative for dizziness and headaches.     Objective:    BP 122/83   Pulse 75   Temp 97.9 F (36.6 C) (Oral)   Wt 161 lb (73 kg)   SpO2 97%   BMI 22.45 kg/m  Nursing note and vital signs reviewed.  Physical Exam Constitutional:  General: He is not in acute distress.    Appearance: He is well-developed.  Eyes:     Conjunctiva/sclera: Conjunctivae normal.  Cardiovascular:     Rate and Rhythm: Normal rate and regular rhythm.     Heart sounds: Normal heart sounds. No murmur heard.   No friction rub. No gallop.  Pulmonary:     Effort: Pulmonary effort is normal. No respiratory distress.     Breath sounds: Normal breath sounds. No wheezing or rales.  Chest:     Chest wall: No tenderness.  Abdominal:      General: Bowel sounds are normal.     Palpations: Abdomen is soft.     Tenderness: There is no abdominal tenderness.  Musculoskeletal:     Cervical back: Neck supple.  Lymphadenopathy:     Cervical: No cervical adenopathy.  Skin:    General: Skin is warm and dry.     Findings: No rash.  Neurological:     Mental Status: He is alert and oriented to person, place, and time.  Psychiatric:        Behavior: Behavior normal.        Thought Content: Thought content normal.        Judgment: Judgment normal.     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 Lambert is feeling better and oral intake is improved and is seeking employment.  Still having depressive symptoms at times and denies suicidal ideations.  There are no signs of psychosis.  May benefit from counseling although appears to be recovering slowly.  There is no indication for medication at this time.  Continue to monitor.      HIV disease [facility])    Mr. Stoffers continues to do well with good adherence and tolerance to his ART regimen of Biktarvy.  Anticipate that he is likely undetectable at this point  given viral load improvements down to 761 about 1 month ago.  No signs/symptoms of opportunistic infection.  We discussed the concept that undetectable equals an transmittable in the information associated with it.  He is actively seeking employment.  Check lab work today.  Continue current dose of Biktarvy.  Plan for follow-up in 1 month or sooner if needed.      Relevant Medications   bictegravir-emtricitabine-tenofovir AF (BIKTARVY) 50-200-25 MG TABS tablet   Other Relevant Orders   T-helper cell (CD4)- ([facility] clinic only)   HIV-1 RNA quant-no reflex-bld   Healthcare maintenance    Discussed importance of safe sexual practice to reduce risk of STI.  Condoms provided. Declines vaccines will discuss at next office visit.  I am having Isahi F. Guzzetta maintain his ibuprofen, ondansetron, and Biktarvy.   Meds ordered this encounter  Medications   bictegravir-emtricitabine-tenofovir AF (BIKTARVY) 50-200-25 MG TABS tablet    Sig: Take 1 tablet by mouth daily.    Dispense:  30 tablet    Refill:  4    Order Specific Question:   Supervising Provider    Answer:   Judyann Munson [[id number]]     Follow-up: Return in about 1 month (around 12/16/2020).   Marcos Eke, MSN, FNP-C Nurse Practitioner [facility] for Infectious Disease [facility] Main number: [phone]

## 2020-11-18 LAB — T-HELPER CELLS (CD4) COUNT (NOT AT ARMC)
Absolute CD4: 842 cells/uL (ref 490–1740)
CD4 T Helper %: 32 % (ref 30–61)
Total lymphocyte count: 2645 cells/uL (ref 850–3900)

## 2020-11-18 LAB — HIV-1 RNA QUANT-NO REFLEX-BLD
HIV 1 RNA Quant: 199 Copies/mL — ABNORMAL HIGH
HIV-1 RNA Quant, Log: 2.3 Log cps/mL — ABNORMAL HIGH

## 2020-11-25 ENCOUNTER — Encounter: Payer: Self-pay | Admitting: Family

## 2020-12-16 ENCOUNTER — Ambulatory Visit (INDEPENDENT_AMBULATORY_CARE_PROVIDER_SITE_OTHER): Payer: Self-pay | Admitting: Family

## 2020-12-16 ENCOUNTER — Other Ambulatory Visit: Payer: Self-pay

## 2020-12-16 ENCOUNTER — Encounter: Payer: Self-pay | Admitting: Family

## 2020-12-16 VITALS — BP 118/78 | HR 87 | Temp 98.5°F | Ht 71.0 in | Wt 161.0 lb

## 2020-12-16 DIAGNOSIS — B20 Human immunodeficiency virus [HIV] disease: Secondary | ICD-10-CM

## 2020-12-16 DIAGNOSIS — F332 Major depressive disorder, recurrent severe without psychotic features: Secondary | ICD-10-CM

## 2020-12-16 DIAGNOSIS — Z0000 Encounter for general adult medical examination without abnormal findings: Secondary | ICD-10-CM

## 2020-12-16 NOTE — Patient Instructions (Addendum)
Nice to see you.  We will check your lab work today.  Schedule appointment with counselor.   Continue to take your medication daily.  Please call [facility]) to schedule/follow up on your dental care at [phone]  Plan for follow up in 1 month or sooner if needed.   Have a great day and stay safe!

## 2020-12-16 NOTE — Progress Notes (Signed)
Brief Narrative   Patient ID: Jerry Johnson, male    DOB: 30-May-1990, 30 y.o.   MRN: [id number]  Jerry Johnson is a 30 y/o AA diagnosed with HIV on 09/20/20 with risk factor of bisexual contact. Genotype with Subtype B and no significant resistance. No history of opportunistic infection. Initial CD4 count of 220 and viral load >10 million likely reflecting acute infection. Enters care at [facility] Stage 2. Sole ART regimen of Biktarvy.   Subjective:    Chief Complaint  Patient presents with   Follow-up    Given condoms; declines MPX vaccine     HPI:  Jerry Johnson is a 30 y.o. male with HIV disease last seen on 11/15/20 with good adherence and tolerance to his ART regimen of BUktarvy. Viral load was 199 and CD4 count 842.  Here today for routine follow-up.  Jerry Johnson continues to take his Susanne Borders daily as prescribed with no adverse side effects or missed doses since his last office visit.  Overall feeling well with no new concerns/complaints.  He does have several questions regarding general HIV care/diagnosis. Denies fevers, chills, night sweats, headaches, changes in vision, neck pain/stiffness, nausea, diarrhea, vomiting, lesions or rashes.  Jerry Johnson is interested in counseling and we will get him connected with our services here.  Has been feeling down at times.  No suicidal ideation.  Continues to use marijuana and drink alcohol on occasion and smokes approximately 1/4 pack of cigarettes per day.  Condoms provided.   No Known Allergies    Outpatient Medications Prior to Visit  Medication Sig Dispense Refill   bictegravir-emtricitabine-tenofovir AF (BIKTARVY) 50-200-25 MG TABS tablet Take 1 tablet by mouth daily. 30 tablet 4   ibuprofen (ADVIL) 600 MG tablet Take 1 tablet (600 mg total) by mouth every 6 (six) hours as needed for moderate pain. 30 tablet 0   ondansetron (ZOFRAN ODT) 4 MG disintegrating tablet Dissolve 1 tablet (4 mg total) by mouth every 8  (eight) hours as needed for nausea or vomiting. 20 tablet 0   No facility-administered medications prior to visit.     Past Medical History:  Diagnosis Date   Depression    UNSPECIFIED HYDROCELE 03/12/2009   Qualifier: Diagnosis of  By: Burnadette Pop  MD, Trisha Mangle       Past Surgical History:  Procedure Laterality Date   INGUINAL HERNIA REPAIR  03/21/09   Repair of R inguinal hernia with hydrocele      Review of Systems  Constitutional:  Negative for appetite change, chills, fatigue, fever and unexpected weight change.  Eyes:  Negative for visual disturbance.  Respiratory:  Negative for cough, chest tightness, shortness of breath and wheezing.   Cardiovascular:  Negative for chest pain and leg swelling.  Gastrointestinal:  Negative for abdominal pain, constipation, diarrhea, nausea and vomiting.  Genitourinary:  Negative for dysuria, flank pain, frequency, genital sores, hematuria and urgency.  Skin:  Negative for rash.  Allergic/Immunologic: Negative for immunocompromised state.  Neurological:  Negative for dizziness and headaches.     Objective:    BP 118/78   Pulse 87   Temp 98.5 F (36.9 C) (Oral)   Ht 5\' 11"  (1.803 m)   Wt 161 lb (73 kg)   SpO2 97%   BMI 22.45 kg/m  Nursing note and vital signs reviewed.  Physical Exam Constitutional:      General: He is not in acute distress.    Appearance: He is well-developed.  Eyes:     Conjunctiva/sclera:  Conjunctivae normal.  Cardiovascular:     Rate and Rhythm: Normal rate and regular rhythm.     Heart sounds: Normal heart sounds. No murmur heard.   No friction rub. No gallop.  Pulmonary:     Effort: Pulmonary effort is normal. No respiratory distress.     Breath sounds: Normal breath sounds. No wheezing or rales.  Chest:     Chest wall: No tenderness.  Abdominal:     General: Bowel sounds are normal.     Palpations: Abdomen is soft.     Tenderness: There is no abdominal tenderness.  Musculoskeletal:      Cervical back: Neck supple.  Lymphadenopathy:     Cervical: No cervical adenopathy.  Skin:    General: Skin is warm and dry.     Findings: No rash.  Neurological:     Mental Status: He is alert and oriented to person, place, and time.  Psychiatric:        Behavior: Behavior normal.        Thought Content: Thought content normal.        Judgment: Judgment normal.     Depression screen [facility] 2/9 12/16/2020 09/23/2020 06/07/2013  Decreased Interest 0 0 0  Down, Depressed, Hopeless 0 0 0  PHQ - 2 Score 0 0 0       Assessment & Plan:    Patient Active Problem List   Diagnosis Date Noted   Healthcare maintenance 11/15/2020   HIV disease (HCC) 09/23/2020   Adjustment disorder with mixed anxiety and depressed mood 10/30/2016   Suicidal ideation 10/30/2016   Major depressive disorder, 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   Rash and nonspecific skin eruption 01/10/2014   Otitis externa of both ears 01/10/2014   Acute pharyngitis 06/07/2013   Right groin pain 05/27/2012   CONTACT DERMATITIS 12/12/2008   ALLERGIC RHINITIS 01/11/2008   ATTENTION DEFICIT, W/HYPERACTIVITY 06/03/2006     Problem List Items Addressed This Visit       Other   Major depressive disorder, recurrent severe without psychotic features [facility])    Jerry Johnson appears to be doing okay although clinically appears to be down/depressed.  No suicidal ideation or signs of psychosis.  Agree with his interest for counseling and he will schedule an appointment.  No medication indicated currently as he likely continues to grieve with his new diagnosis.  Continue to monitor.      HIV disease [facility]) - Primary    Jerry Johnson has well-controlled virus with good adherence and tolerance to his ART regimen of Biktarvy.  No signs/symptoms of opportunistic infection.  We reviewed some of the basic information regarding HIV care/diagnosis per his request.  Discussed U=U.  Check lab work today.  Continue  current dose of Biktarvy.  Plan for follow-up in 1 month or sooner if needed.      Relevant Orders   HIV-1 RNA quant-no reflex-bld   T-helper cell (CD4)- ([facility] clinic only)   Healthcare maintenance    Discussed importance of safe sexual practices and condom usage.  Condoms offered. Discussed/recommended monkeypox vaccine which is declined.         I have discontinued Jerry Johnson's ondansetron. I am also having him maintain his ibuprofen and Biktarvy.   Follow-up: Return in about 1 month (around 01/15/2021), or if symptoms worsen or fail to improve.   Marcos Eke, MSN, FNP-C Nurse Practitioner [facility] for Infectious Disease [facility] Main number: [phone]

## 2020-12-16 NOTE — Assessment & Plan Note (Signed)
Mr. Jerry Johnson appears to be doing okay although clinically appears to be down/depressed.  No suicidal ideation or signs of psychosis.  Agree with his interest for counseling and he will schedule an appointment.  No medication indicated currently as he likely continues to grieve with his new diagnosis.  Continue to monitor.

## 2020-12-16 NOTE — Assessment & Plan Note (Signed)
Mr. Laverdiere has well-controlled virus with good adherence and tolerance to his ART regimen of Biktarvy.  No signs/symptoms of opportunistic infection.  We reviewed some of the basic information regarding HIV care/diagnosis per his request.  Discussed U=U.  Check lab work today.  Continue current dose of Biktarvy.  Plan for follow-up in 1 month or sooner if needed.

## 2020-12-16 NOTE — Assessment & Plan Note (Signed)
   Discussed importance of safe sexual practices and condom usage.  Condoms offered.  Discussed/recommended monkeypox vaccine which is declined.

## 2020-12-17 LAB — T-HELPER CELL (CD4) - (RCID CLINIC ONLY)
CD4 % Helper T Cell: 32 % — ABNORMAL LOW (ref 33–65)
CD4 T Cell Abs: 606 /uL (ref 400–1790)

## 2020-12-21 LAB — HIV-1 RNA QUANT-NO REFLEX-BLD
HIV 1 RNA Quant: 95 Copies/mL — ABNORMAL HIGH
HIV-1 RNA Quant, Log: 1.98 Log cps/mL — ABNORMAL HIGH

## 2021-01-13 ENCOUNTER — Ambulatory Visit: Payer: Self-pay | Admitting: Family

## 2021-01-15 ENCOUNTER — Ambulatory Visit: Payer: Self-pay | Admitting: Family

## 2021-02-03 ENCOUNTER — Other Ambulatory Visit: Payer: Self-pay

## 2021-02-03 ENCOUNTER — Encounter: Payer: Self-pay | Admitting: Family

## 2021-02-03 ENCOUNTER — Ambulatory Visit (INDEPENDENT_AMBULATORY_CARE_PROVIDER_SITE_OTHER): Payer: Self-pay | Admitting: Family

## 2021-02-03 VITALS — BP 133/85 | HR 70 | Temp 98.6°F | Wt 159.0 lb

## 2021-02-03 DIAGNOSIS — F332 Major depressive disorder, recurrent severe without psychotic features: Secondary | ICD-10-CM

## 2021-02-03 DIAGNOSIS — B20 Human immunodeficiency virus [HIV] disease: Secondary | ICD-10-CM

## 2021-02-03 DIAGNOSIS — Z0000 Encounter for general adult medical examination without abnormal findings: Secondary | ICD-10-CM

## 2021-02-03 MED ORDER — BIKTARVY 50-200-25 MG PO TABS: 1.0000 | ORAL_TABLET | Freq: Every day | ORAL | 4 refills | Status: DC

## 2021-02-03 NOTE — Assessment & Plan Note (Signed)
·   Discussed importance of safe sexual practice and condom usage. Condoms offered. °· Declines vaccines.  °

## 2021-02-03 NOTE — Assessment & Plan Note (Signed)
Mr. Cupp continues to have feelings of being down and depressed that wax and wane at times.  No suicidal ideations or signs of psychosis.  Discussed importance of having support around him as well as the challenges associated with the stigma of HIV disease.  We again discussed counseling which he is not currently interested in and also the possibility of starting medication.  Appears to be making good strides slowly and will hold off on medication at this point.

## 2021-02-03 NOTE — Patient Instructions (Addendum)
Nice to see you.  Continue to take your medication daily as prescribed.  Refills have been sent to the pharmacy.  Plan for follow up in 1 month or sooner if needed with lab work on the same day.  Continue to work hard......Marland Kitchenyou are making good strides!

## 2021-02-03 NOTE — Assessment & Plan Note (Signed)
Jerry Johnson continues to have well-controlled virus with good adherence and tolerance to his ART regimen of Biktarvy.  No signs/symptoms of opportunistic infection.  Reviewed previous lab work and discussed plan of care.  He continues to battle with his new diagnosis that is contributing to his mood.  Check lab work today.  Continue current dose of Biktarvy.  Plan for follow-up in 1 month or sooner if needed with lab work on the same day.

## 2021-02-03 NOTE — Progress Notes (Signed)
Brief Narrative   Patient ID: Jerry Johnson, male    DOB: [birth year], 30 y.o.   MRN: [id number]  Jerry Johnson is a 30 y/o AA diagnosed with HIV on 09/20/20 with risk factor of bisexual contact. Genotype with Subtype B and no significant resistance. No history of opportunistic infection. Initial CD4 count of 220 and viral load >10 million likely reflecting acute infection. Enters care at [facility] Stage 2. Sole ART regimen of Biktarvy.  Subjective:    Chief Complaint  Patient presents with   Follow-up    Given condoms     HPI:  Jerry Johnson is a 30 y.o. male with HIV disease last seen on 12/16/2020 with adequately controlled virus with good adherence and tolerance to his ART regimen of Biktarvy.  Viral load at the time was 95 with CD4 count of 606.  Continue to appear depressed with no suicidal ideations or signs of psychosis.  Counseling was encouraged.  Here today for routine follow-up.  Jerry Johnson has been taking his Biktarvy daily as prescribed with no adverse side effects or missed doses since his last office visit.  Continues to have feelings of being down and depressed with decreased motivation and energy.  Has anxiety regarding passing the virus onto other people. Denies fevers, chills, night sweats, headaches, changes in vision, neck pain/stiffness, nausea, diarrhea, vomiting, lesions or rashes.  Jerry Johnson has no problems obtaining medication from the pharmacy.  Denies suicidal ideations.  Continues to smoke marijuana 4 times per week with occasional alcohol consumption and approximately 1/4 pack of cigarettes per day.  His mom is his biggest support person.  Several people recently were told about his diagnosis that were unintended to be informed.    No Known Allergies    Outpatient Medications Prior to Visit  Medication Sig Dispense Refill   bictegravir-emtricitabine-tenofovir AF (BIKTARVY) 50-200-25 MG TABS tablet Take 1 tablet by mouth daily. 30  tablet 4   ibuprofen (ADVIL) 600 MG tablet Take 1 tablet (600 mg total) by mouth every 6 (six) hours as needed for moderate pain. 30 tablet 0   No facility-administered medications prior to visit.     Past Medical History:  Diagnosis Date   Depression    UNSPECIFIED HYDROCELE 03/12/2009   Qualifier: Diagnosis of  By: Netty Starring  MD, Lucianne Muss       Past Surgical History:  Procedure Laterality Date   INGUINAL HERNIA REPAIR  03/21/09   Repair of R inguinal hernia with hydrocele      Review of Systems  Constitutional:  Positive for appetite change. Negative for chills, fatigue, fever and unexpected weight change.  Eyes:  Negative for visual disturbance.  Respiratory:  Negative for cough, chest tightness, shortness of breath and wheezing.   Cardiovascular:  Negative for chest pain and leg swelling.  Gastrointestinal:  Negative for abdominal pain, constipation, diarrhea, nausea and vomiting.  Genitourinary:  Negative for dysuria, flank pain, frequency, genital sores, hematuria and urgency.  Skin:  Negative for rash.  Allergic/Immunologic: Negative for immunocompromised state.  Neurological:  Negative for dizziness and headaches.  Psychiatric/Behavioral:  Positive for dysphoric mood. Negative for sleep disturbance. The patient is nervous/anxious.      Objective:    BP 133/85   Pulse 70   Temp 98.6 F (37 C) (Oral)   Wt 159 lb (72.1 kg)   SpO2 98%   BMI 22.18 kg/m  Nursing note and vital signs reviewed.  Physical Exam Constitutional:      General:  He is not in acute distress.    Appearance: He is well-developed.  Eyes:     Conjunctiva/sclera: Conjunctivae normal.  Cardiovascular:     Rate and Rhythm: Normal rate and regular rhythm.     Heart sounds: Normal heart sounds. No murmur heard.   No friction rub. No gallop.  Pulmonary:     Effort: Pulmonary effort is normal. No respiratory distress.     Breath sounds: Normal breath sounds. No wheezing or rales.  Chest:      Chest wall: No tenderness.  Abdominal:     General: Bowel sounds are normal.     Palpations: Abdomen is soft.     Tenderness: There is no abdominal tenderness.  Musculoskeletal:     Cervical back: Neck supple.  Lymphadenopathy:     Cervical: No cervical adenopathy.  Skin:    General: Skin is warm and dry.     Findings: No rash.  Neurological:     Mental Status: He is alert.  Psychiatric:        Mood and Affect: Mood is depressed.     Depression screen [facility] 2/9 02/03/2021 12/16/2020 09/23/2020 06/07/2013  Decreased Interest 1 0 0 0  Down, Depressed, Hopeless 3 0 0 0  PHQ - 2 Score 4 0 0 0  Altered sleeping 3 - - -  Tired, decreased energy 3 - - -  Change in appetite 3 - - -  Feeling bad or failure about yourself  2 - - -  Trouble concentrating 0 - - -  Moving slowly or fidgety/restless 3 - - -  Suicidal thoughts 0 - - -  PHQ-9 Score 18 - - -  Difficult doing work/chores Extremely dIfficult - - -       Assessment & Plan:    Patient Active Problem List   Diagnosis Date Noted   Healthcare maintenance 11/15/2020   HIV disease ([place]) 09/23/2020   Adjustment disorder with mixed anxiety and depressed mood 10/30/2016   Suicidal ideation 10/30/2016   Major depressive disorder, recurrent severe without psychotic features ([place]) 10/29/2016   Rash and nonspecific skin eruption 01/10/2014   Otitis externa of both ears 01/10/2014   Acute pharyngitis 06/07/2013   Right groin pain 05/27/2012   CONTACT DERMATITIS 12/12/2008   ALLERGIC RHINITIS 01/11/2008   ATTENTION DEFICIT, W/HYPERACTIVITY 06/03/2006     Problem List Items Addressed This Visit       Other   Major depressive disorder, recurrent severe without psychotic features [facility])    Jerry Johnson continues to have feelings of being down and depressed that wax and wane at times.  No suicidal ideations or signs of psychosis.  Discussed importance of having support around him as well as the challenges associated with the stigma of  HIV disease.  We again discussed counseling which he is not currently interested in and also the possibility of starting medication.  Appears to be making good strides slowly and will hold off on medication at this point.      HIV disease [facility])    Jerry Johnson continues to have well-controlled virus with good adherence and tolerance to his ART regimen of Biktarvy.  No signs/symptoms of opportunistic infection.  Reviewed previous lab work and discussed plan of care.  He continues to battle with his new diagnosis that is contributing to his mood.  Check lab work today.  Continue current dose of Biktarvy.  Plan for follow-up in 1 month or sooner if needed with lab work on the same  day.      Relevant Medications   bictegravir-emtricitabine-tenofovir AF (BIKTARVY) 50-200-25 MG TABS tablet   Other Relevant Orders   COMPLETE METABOLIC PANEL WITH GFR   HIV-1 RNA quant-no reflex-bld   T-helper cell (CD4)- ([facility] clinic only)   Healthcare maintenance    Discussed importance of safe sexual practice and condom usage.  Condoms offered. Declines vaccines.        I am having Jerry Johnson maintain his ibuprofen and Biktarvy.   Meds ordered this encounter  Medications   bictegravir-emtricitabine-tenofovir AF (BIKTARVY) 50-200-25 MG TABS tablet    Sig: Take 1 tablet by mouth daily.    Dispense:  30 tablet    Refill:  4    Order Specific Question:   Supervising Provider    Answer:   Judyann Munson [[id number]]     Follow-up: Return in about 1 month (around 03/05/2021), or if symptoms worsen or fail to improve.   Jerry Eke, MSN, FNP-C Nurse Practitioner [facility] for Infectious Disease [facility] Main number: [phone]

## 2021-02-04 LAB — T-HELPER CELL (CD4) - (RCID CLINIC ONLY)
CD4 % Helper T Cell: 26 % — ABNORMAL LOW (ref 33–65)
CD4 T Cell Abs: 578 /uL (ref 400–1790)

## 2021-02-06 LAB — COMPLETE METABOLIC PANEL WITH GFR
AG Ratio: 1.5 (calc) (ref 1.0–2.5)
ALT: 13 U/L (ref 9–46)
AST: 20 U/L (ref 10–40)
Albumin: 4.9 g/dL (ref 3.6–5.1)
Alkaline phosphatase (APISO): 70 U/L (ref 36–130)
BUN: 11 mg/dL (ref 7–25)
CO2: 26 mmol/L (ref 20–32)
Calcium: 10.1 mg/dL (ref 8.6–10.3)
Chloride: 104 mmol/L (ref 98–110)
Creat: 0.95 mg/dL (ref 0.60–1.24)
Globulin: 3.2 g/dL (calc) (ref 1.9–3.7)
Glucose, Bld: 83 mg/dL (ref 65–99)
Potassium: 3.9 mmol/L (ref 3.5–5.3)
Sodium: 140 mmol/L (ref 135–146)
Total Bilirubin: 1.4 mg/dL — ABNORMAL HIGH (ref 0.2–1.2)
Total Protein: 8.1 g/dL (ref 6.1–8.1)
eGFR: 112 mL/min/{1.73_m2} (ref >=60)

## 2021-02-06 LAB — HIV-1 RNA QUANT-NO REFLEX-BLD
HIV 1 RNA Quant: 71 Copies/mL — ABNORMAL HIGH
HIV-1 RNA Quant, Log: 1.85 Log cps/mL — ABNORMAL HIGH

## 2021-03-04 ENCOUNTER — Ambulatory Visit: Payer: Self-pay | Admitting: Family

## 2021-03-23 ENCOUNTER — Other Ambulatory Visit: Payer: Self-pay | Admitting: Family

## 2021-03-23 DIAGNOSIS — B20 Human immunodeficiency virus [HIV] disease: Secondary | ICD-10-CM

## 2021-04-11 ENCOUNTER — Other Ambulatory Visit: Payer: Self-pay

## 2021-04-11 DIAGNOSIS — B20 Human immunodeficiency virus [HIV] disease: Secondary | ICD-10-CM

## 2021-04-15 ENCOUNTER — Other Ambulatory Visit: Payer: Medicaid Other

## 2021-04-29 ENCOUNTER — Ambulatory Visit (INDEPENDENT_AMBULATORY_CARE_PROVIDER_SITE_OTHER): Payer: Self-pay | Admitting: Family

## 2021-04-29 ENCOUNTER — Encounter: Payer: Self-pay | Admitting: Family

## 2021-04-29 ENCOUNTER — Other Ambulatory Visit: Payer: Self-pay

## 2021-04-29 ENCOUNTER — Ambulatory Visit: Payer: Self-pay

## 2021-04-29 VITALS — BP 137/87 | HR 68 | Temp 98.0°F | Ht 71.0 in | Wt 167.4 lb

## 2021-04-29 DIAGNOSIS — Z79899 Other long term (current) drug therapy: Secondary | ICD-10-CM

## 2021-04-29 DIAGNOSIS — Z113 Encounter for screening for infections with a predominantly sexual mode of transmission: Secondary | ICD-10-CM

## 2021-04-29 DIAGNOSIS — B20 Human immunodeficiency virus [HIV] disease: Secondary | ICD-10-CM

## 2021-04-29 DIAGNOSIS — F332 Major depressive disorder, recurrent severe without psychotic features: Secondary | ICD-10-CM

## 2021-04-29 DIAGNOSIS — Z0000 Encounter for general adult medical examination without abnormal findings: Secondary | ICD-10-CM

## 2021-04-29 MED ORDER — BIKTARVY 50-200-25 MG PO TABS: 1.0000 | ORAL_TABLET | Freq: Every day | ORAL | 4 refills | Status: DC

## 2021-04-29 NOTE — Assessment & Plan Note (Signed)
·   Discussed importance of safe sexual practices and condom use.  Condoms offered.  Declines influenza vaccine.

## 2021-04-29 NOTE — Assessment & Plan Note (Signed)
Jerry Johnson continues to have symptoms associated with depression although appears to be improving.  Continue to recommend counseling to help with his feelings which he declines at this time.  No suicidal ideations or signs of psychosis.  No current indications for medication.  Continue to monitor.

## 2021-04-29 NOTE — Assessment & Plan Note (Signed)
Jerry Johnson continues to have well-controlled virus with good adherence and tolerance to his ART regimen of Biktarvy.  No signs/symptoms of opportunistic infection.  We reviewed previous lab work and discussed plan of care.  Check blood work today.  Renew financial assistance.  Has challenges with transportation we will arrange assistance.  Met with [facility] for intake.  Continue current dose of Biktarvy.  Plan for follow-up in 2 months or sooner if needed with lab work on the same day.

## 2021-04-29 NOTE — Patient Instructions (Addendum)
Nice to see you.  We will check your lab work today.  Continue to take your medication daily as prescribed.  Refills have been sent to the pharmacy.  Plan for follow up in 2 months or sooner if needed with lab work on the same day.  Have a great day and stay safe!  

## 2021-04-29 NOTE — Progress Notes (Signed)
Brief Narrative   Patient ID: Jerry Johnson, male    DOB: 1991-02-17, 31 y.o.   MRN: [id number]  Jerry Johnson is a 31 y/o AA diagnosed with HIV on 09/20/20 with risk factor of bisexual contact. Genotype with Subtype B and no significant resistance. No history of opportunistic infection. Initial CD4 count of 220 and viral load >10 million likely reflecting acute infection. Enters care at [facility] Stage 2. Sole ART regimen of Biktarvy.  Subjective:    Chief Complaint  Patient presents with   Follow-up    HPI:  Jerry Johnson is a 31 y.o. male with HIV disease last seen on 02/03/2021 with well-controlled virus and good adherence and tolerance to his ART regimen of Biktarvy.  Viral load at the time was 71 with CD4 count of 578.  Also continuing to go through depressive symptoms with no suicidal ideations declining counseling with no indications for medication given slow steady improvements.  Here today for routine follow-up.  Jerry Johnson is present for today's visit with Jerry Johnson's permission.  Jerry Johnson continues to take his Biktarvy daily as prescribed with no adverse side effects.  Has noted increased amounts of bowel movements recently although believes this may be related to intake of dairy. Denies fevers, chills, night sweats, headaches, changes in vision, neck pain/stiffness, nausea, diarrhea, vomiting, lesions or rashes.  Jerry Johnson has no problems obtaining medication from the pharmacy and remains covered by [employer].  Continues to have feelings of being down and depressed at times although it is improving slowly.  Denies suicidal ideations.  Continues to smoke tobacco and marijuana with rare alcohol consumption.  Condoms offered.  Currently working in [employer] and looking to obtain a job in [employer] or possibly obtain his [employer].  Healthcare maintenance due includes influenza vaccine and pneumonia vaccines.  No Known Allergies    Outpatient  Medications Prior to Visit  Medication Sig Dispense Refill   ibuprofen (ADVIL) 600 MG tablet Take 1 tablet (600 mg total) by mouth every 6 (six) hours as needed for moderate pain. 30 tablet 0   BIKTARVY 50-200-25 MG TABS tablet TAKE 1 TABLET BY MOUTH DAILY 30 tablet 1   No facility-administered medications prior to visit.     Past Medical History:  Diagnosis Date   Depression    UNSPECIFIED HYDROCELE 03/12/2009   Qualifier: Diagnosis of  By: Netty Starring  MD, Lucianne Muss       Past Surgical History:  Procedure Laterality Date   INGUINAL HERNIA REPAIR  03/21/09   Repair of R inguinal hernia with hydrocele      Review of Systems  Constitutional:  Negative for appetite change, chills, fatigue, fever and unexpected weight change.  Eyes:  Negative for visual disturbance.  Respiratory:  Negative for cough, chest tightness, shortness of breath and wheezing.   Cardiovascular:  Negative for chest pain and leg swelling.  Gastrointestinal:  Negative for abdominal pain, constipation, diarrhea, nausea and vomiting.  Genitourinary:  Negative for dysuria, flank pain, frequency, genital sores, hematuria and urgency.  Skin:  Negative for rash.  Allergic/Immunologic: Negative for immunocompromised state.  Neurological:  Negative for dizziness and headaches.     Objective:    BP 137/87    Pulse 68    Temp 98 F (36.7 C)    Ht _0  (1.803 m)    Wt 167 lb 6.4 oz (75.9 kg)    BMI 23.35 kg/m  Nursing note and vital signs reviewed.  Physical Exam  Constitutional:      General: He is not in acute distress.    Appearance: He is well-developed.  Eyes:     Conjunctiva/sclera: Conjunctivae normal.  Cardiovascular:     Rate and Rhythm: Normal rate and regular rhythm.     Heart sounds: Normal heart sounds. No murmur heard.   No friction rub. No gallop.  Pulmonary:     Effort: Pulmonary effort is normal. No respiratory distress.     Breath sounds: Normal breath sounds. No wheezing or rales.  Chest:      Chest wall: No tenderness.  Abdominal:     General: Bowel sounds are normal.     Palpations: Abdomen is soft.     Tenderness: There is no abdominal tenderness.  Musculoskeletal:     Cervical back: Neck supple.  Lymphadenopathy:     Cervical: No cervical adenopathy.  Skin:    General: Skin is warm and dry.     Findings: No rash.  Neurological:     Mental Status: He is alert and oriented to person, place, and time.  Psychiatric:        Behavior: Behavior normal.        Thought Content: Thought content normal.        Judgment: Judgment normal.     Depression screen [facility] 2/9 02/03/2021 12/16/2020 09/23/2020 06/07/2013  Decreased Interest 1 0 0 0  Down, Depressed, Hopeless 3 0 0 0  PHQ - 2 Score 4 0 0 0  Altered sleeping 3 - - -  Tired, decreased energy 3 - - -  Change in appetite 3 - - -  Feeling bad or failure about yourself  2 - - -  Trouble concentrating 0 - - -  Moving slowly or fidgety/restless 3 - - -  Suicidal thoughts 0 - - -  PHQ-9 Score 18 - - -  Difficult doing work/chores Extremely dIfficult - - -       Assessment & Plan:    Patient Active Problem List   Diagnosis Date Noted   Healthcare maintenance 11/15/2020   HIV disease ([place]) 09/23/2020   Adjustment disorder with mixed anxiety and depressed mood 10/30/2016   Suicidal ideation 10/30/2016   Major depressive disorder, recurrent severe without psychotic features ([place]) 10/29/2016   Rash and nonspecific skin eruption 01/10/2014   Otitis externa of both ears 01/10/2014   Acute pharyngitis 06/07/2013   Right groin pain 05/27/2012   CONTACT DERMATITIS 12/12/2008   ALLERGIC RHINITIS 01/11/2008   ATTENTION DEFICIT, W/HYPERACTIVITY 06/03/2006     Problem List Items Addressed This Visit       Other   Major depressive disorder, recurrent severe without psychotic features [facility])    Jerry Johnson continues to have symptoms associated with depression although appears to be improving.  Continue to recommend  counseling to help with his feelings which he declines at this time.  No suicidal ideations or signs of psychosis.  No current indications for medication.  Continue to monitor.      HIV disease [facility]) - Primary    Mr. Baeten continues to have well-controlled virus with good adherence and tolerance to his ART regimen of Biktarvy.  No signs/symptoms of opportunistic infection.  We reviewed previous lab work and discussed plan of care.  Check blood work today.  Renew financial assistance.  Has challenges with transportation we will arrange assistance.  Met with [facility] for intake.  Continue current dose of Biktarvy.  Plan for follow-up in 2 months or sooner if  needed with lab work on the same day.      Relevant Medications   bictegravir-emtricitabine-tenofovir AF (BIKTARVY) 50-200-25 MG TABS tablet   Other Relevant Orders   HIV-1 RNA quant-no reflex-bld   T-helper cell (CD4)- ([facility] clinic only)   COMPLETE METABOLIC PANEL WITH GFR   Healthcare maintenance    Discussed importance of safe sexual practices and condom use.  Condoms offered. Declines influenza vaccine.      Other Visit Diagnoses     Pharmacologic therapy       Screening for STDs (sexually transmitted diseases)       Relevant Orders   RPR        I have changed Dawson F. Garate's Biktarvy. I am also having him maintain his ibuprofen.   Meds ordered this encounter  Medications   bictegravir-emtricitabine-tenofovir AF (BIKTARVY) 50-200-25 MG TABS tablet    Sig: Take 1 tablet by mouth daily.    Dispense:  30 tablet    Refill:  4    Order Specific Question:   Supervising Provider    Answer:   Carlyle Basques [[id number]]     Follow-up: Return in about 2 months (around 06/27/2021), or if symptoms worsen or fail to improve.   Terri Piedra, MSN, FNP-C Nurse Practitioner [facility] for Infectious Disease [place] number: [phone]

## 2021-04-30 LAB — T-HELPER CELL (CD4) - (RCID CLINIC ONLY)
CD4 % Helper T Cell: 29 % — ABNORMAL LOW (ref 33–65)
CD4 T Cell Abs: 812 /uL (ref 400–1790)

## 2021-05-01 LAB — COMPLETE METABOLIC PANEL WITH GFR
AG Ratio: 1.5 (calc) (ref 1.0–2.5)
ALT: 15 U/L (ref 9–46)
AST: 20 U/L (ref 10–40)
Albumin: 4.4 g/dL (ref 3.6–5.1)
Alkaline phosphatase (APISO): 83 U/L (ref 36–130)
BUN: 9 mg/dL (ref 7–25)
CO2: 31 mmol/L (ref 20–32)
Calcium: 9.6 mg/dL (ref 8.6–10.3)
Chloride: 107 mmol/L (ref 98–110)
Creat: 0.91 mg/dL (ref 0.60–1.24)
Globulin: 3 g/dL (calc) (ref 1.9–3.7)
Glucose, Bld: 94 mg/dL (ref 65–99)
Potassium: 3.9 mmol/L (ref 3.5–5.3)
Sodium: 140 mmol/L (ref 135–146)
Total Bilirubin: 0.8 mg/dL (ref 0.2–1.2)
Total Protein: 7.4 g/dL (ref 6.1–8.1)
eGFR: 117 mL/min/{1.73_m2} (ref >=60)

## 2021-05-01 LAB — RPR: RPR Ser Ql: NONREACTIVE

## 2021-05-01 LAB — HIV-1 RNA QUANT-NO REFLEX-BLD
HIV 1 RNA Quant: 61 Copies/mL — ABNORMAL HIGH
HIV-1 RNA Quant, Log: 1.79 Log cps/mL — ABNORMAL HIGH

## 2021-05-08 ENCOUNTER — Ambulatory Visit: Payer: Medicaid Other

## 2021-05-27 ENCOUNTER — Other Ambulatory Visit: Payer: Self-pay | Admitting: Family

## 2021-05-27 DIAGNOSIS — B20 Human immunodeficiency virus [HIV] disease: Secondary | ICD-10-CM

## 2021-05-27 NOTE — Telephone Encounter (Signed)
Patient currently has 4 refills on file.

## 2021-06-24 ENCOUNTER — Telehealth: Payer: Self-pay

## 2021-06-24 ENCOUNTER — Ambulatory Visit: Payer: Medicaid Other | Admitting: Family

## 2021-06-24 NOTE — Telephone Encounter (Signed)
Called patient to see if he would make it to today's appointment, his mother answered the phone and says she will call back to reschedule. Did not discuss any private or health information.  ? ?Sandie Ano, RN ? ?

## 2021-07-07 IMAGING — CT CT ABD-PELV W/ CM
2 of 4 series · 16 of 46 positions shown, 18 images · IV contrast (OMNIPAQUE 300)
Comparison: None.

CLINICAL DATA: Diarrhea and lower abdominal pain

EXAM:
CT ABDOMEN AND PELVIS WITH CONTRAST
TECHNIQUE: Multidetector CT imaging of the abdomen and pelvis was performed
using the standard protocol following bolus administration of
intravenous contrast.
CONTRAST:  100mL OMNIPAQUE IOHEXOL 300 MG/ML  SOLN

[Series 2: axial st · axial · 0.71mm/px · z∈[-496,-96]mm · 13 of 92 slices shown, 15 images]
[im 6/92  soft-tissue]
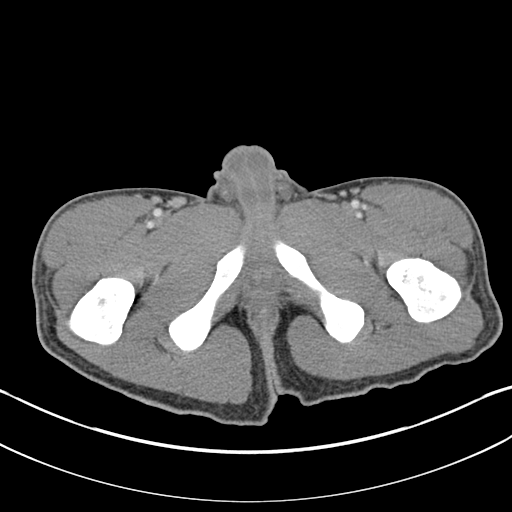
[im 6/92  bone]
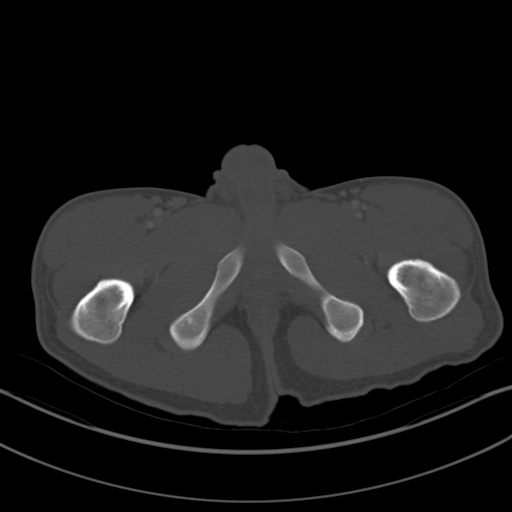
[im 11/92  soft-tissue]
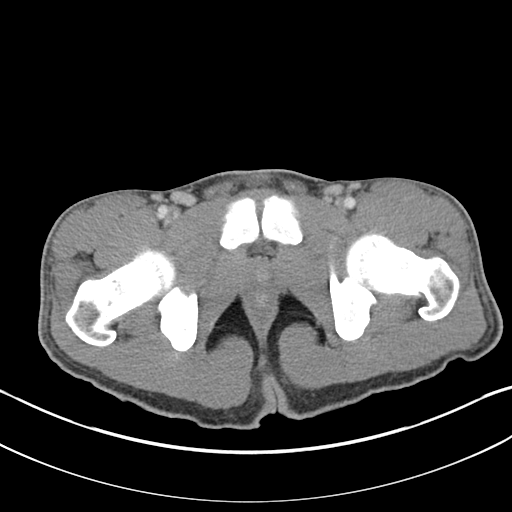
[im 22/92  soft-tissue]
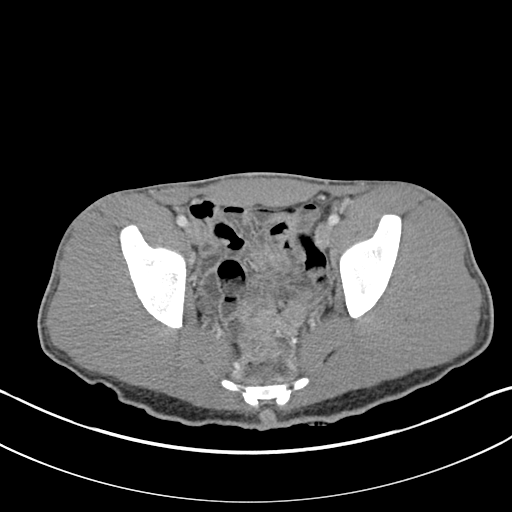
[im 27/92  soft-tissue]
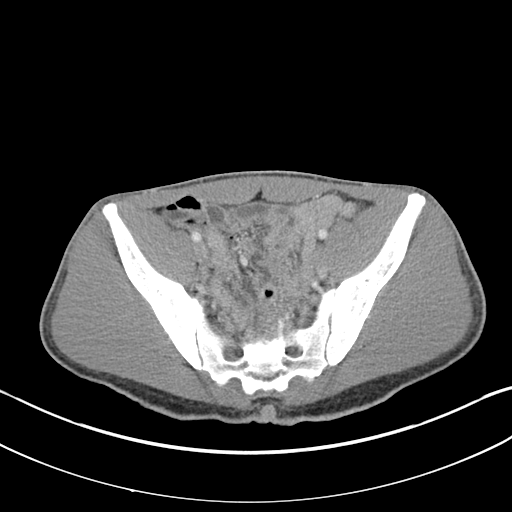
[im 33/92  soft-tissue]
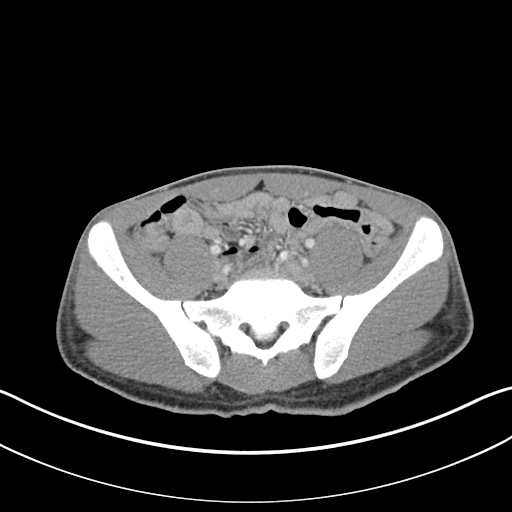
[im 38/92  soft-tissue]
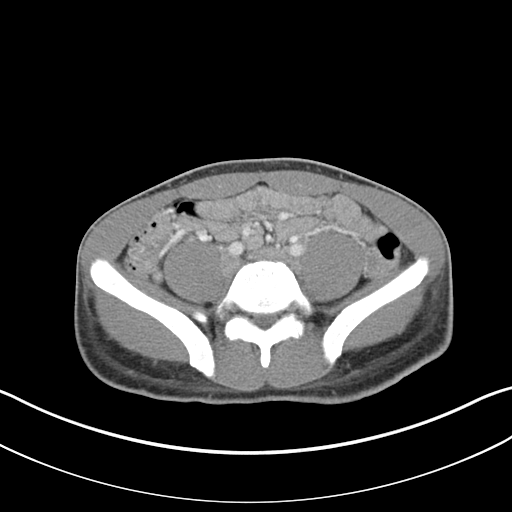
[im 49/92  soft-tissue]
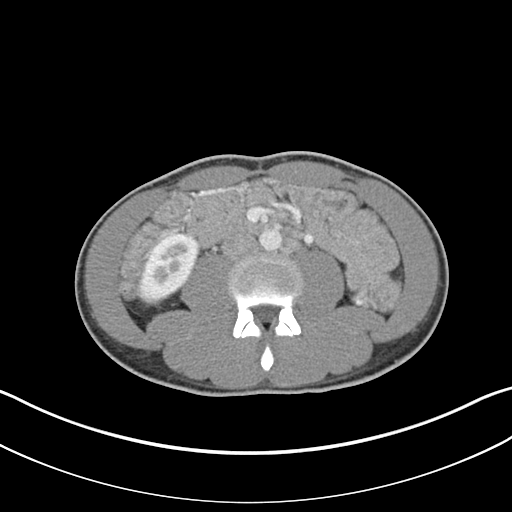
[im 54/92  soft-tissue]
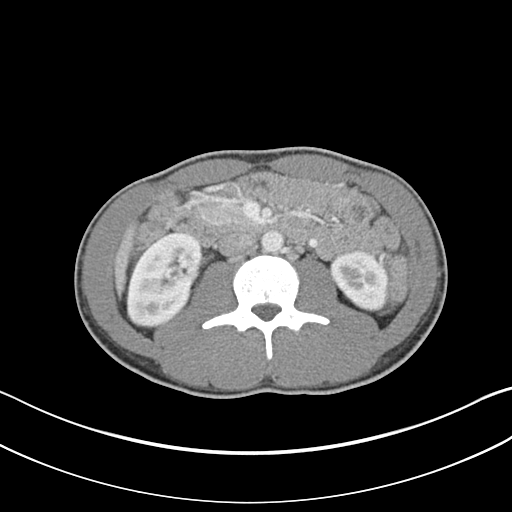
[im 59/92  soft-tissue]
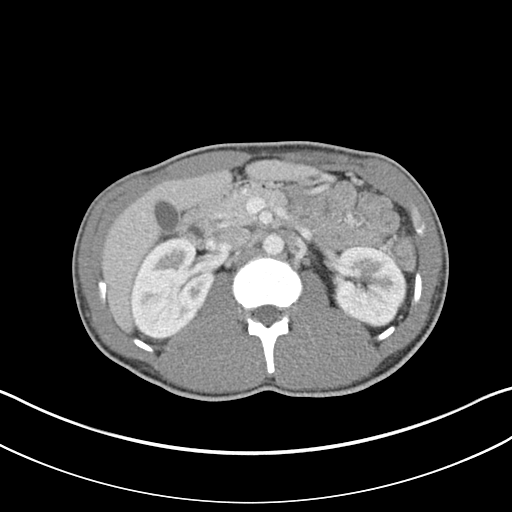
[im 59/92  bone]
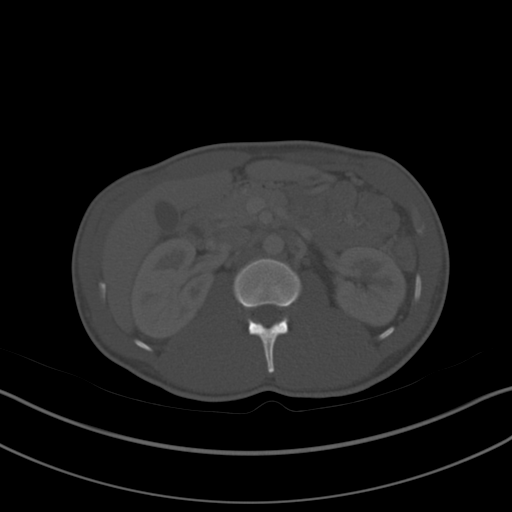
[im 65/92  soft-tissue]
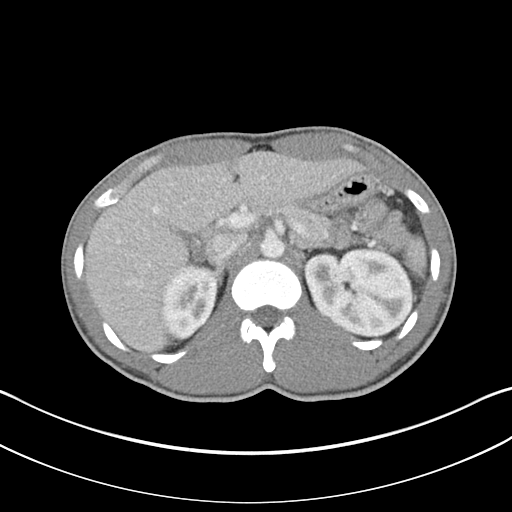
[im 70/92  soft-tissue]
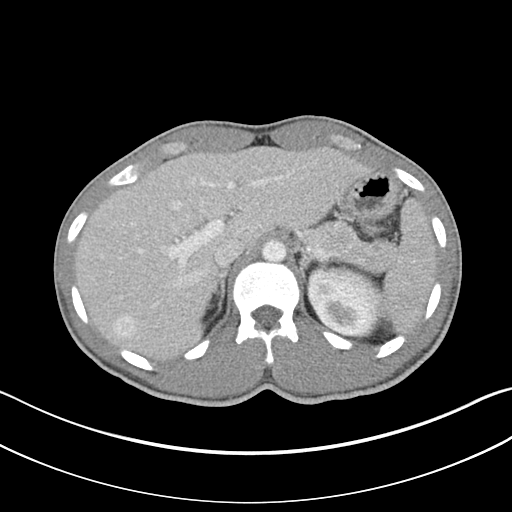
[im 81/92  soft-tissue]
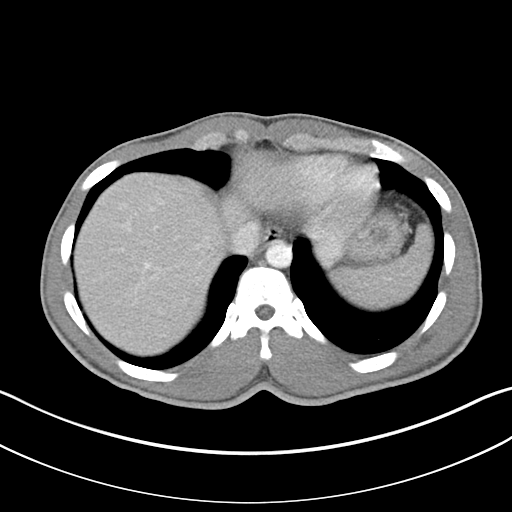
[im 86/92  soft-tissue]
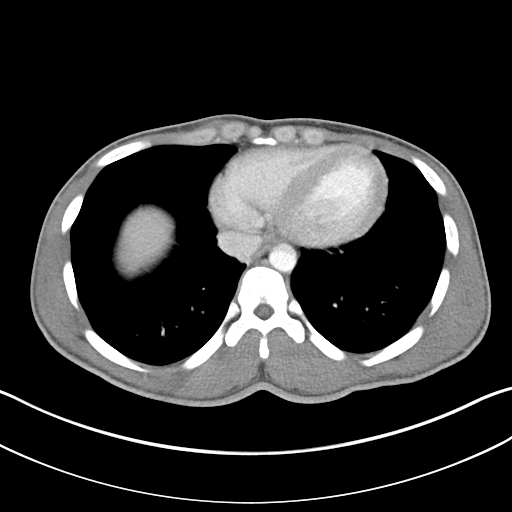

[Series 4: coronal st · coronal · 0.63mm/px · 3 of 106 slices shown]
[im 36/106  soft-tissue]
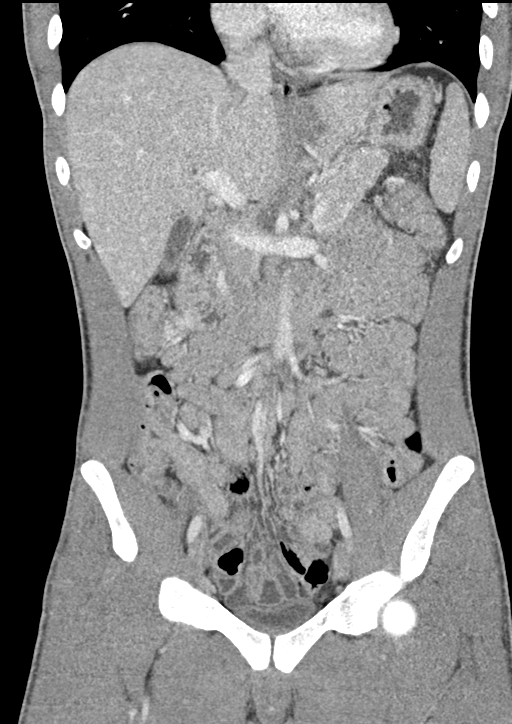
[im 47/106  soft-tissue]
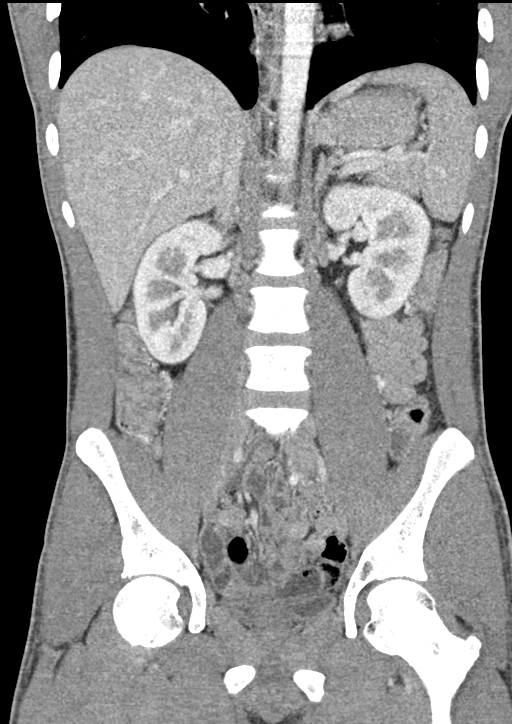
[im 59/106  soft-tissue]
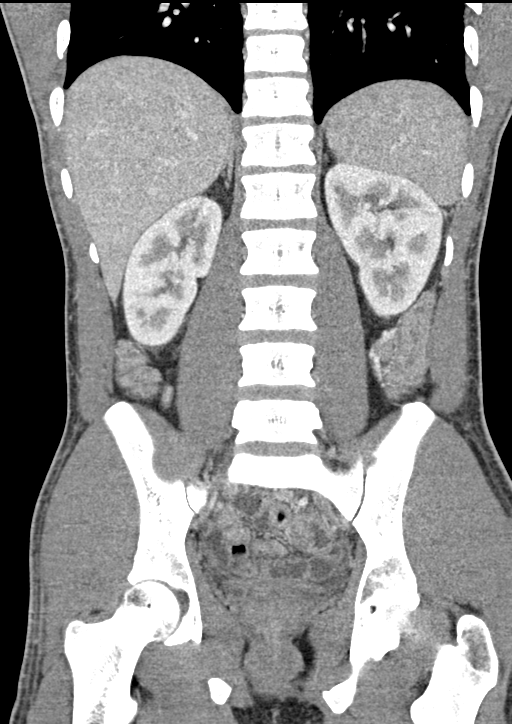

[16 of 46 positions shown; findings below may reference images not displayed]

FINDINGS: Lower chest: No acute abnormality.

Hepatobiliary: No focal liver abnormality is seen. No gallstones,
gallbladder wall thickening, or biliary dilatation.

Pancreas: Unremarkable. No pancreatic ductal dilatation or
surrounding inflammatory changes.

Spleen: Unremarkable.

Adrenals/Urinary Tract: Adrenals, kidneys, and partially distended
bladder are unremarkable.

Stomach/Bowel: Stomach is within normal limits. Bowel is normal in
caliber and decompressed. Mild colonic wall thickening is favored to
be due to under distension. Appendix measures top normal in caliber.
There are no surrounding inflammatory changes.

Vascular/Lymphatic: No significant vascular abnormality on this
noncontrast study. No enlarged lymph nodes identified

Reproductive: Unremarkable.

Other: No free fluid.  Abdominal wall is unremarkable.

Musculoskeletal: No acute or significant osseous abnormality.
IMPRESSION: No definite acute abnormality. Mild colonic wall thickening is
favored to be due to under distension which could be related to
colitis.

## 2021-07-22 ENCOUNTER — Encounter: Payer: Self-pay | Admitting: Family

## 2021-07-22 ENCOUNTER — Ambulatory Visit (INDEPENDENT_AMBULATORY_CARE_PROVIDER_SITE_OTHER): Payer: Self-pay | Admitting: Family

## 2021-07-22 ENCOUNTER — Other Ambulatory Visit: Payer: Self-pay

## 2021-07-22 VITALS — BP 118/76 | HR 66 | Temp 98.0°F | Wt 164.0 lb

## 2021-07-22 DIAGNOSIS — Z0000 Encounter for general adult medical examination without abnormal findings: Secondary | ICD-10-CM

## 2021-07-22 DIAGNOSIS — F332 Major depressive disorder, recurrent severe without psychotic features: Secondary | ICD-10-CM

## 2021-07-22 DIAGNOSIS — B20 Human immunodeficiency virus [HIV] disease: Secondary | ICD-10-CM

## 2021-07-22 MED ORDER — BIKTARVY 50-200-25 MG PO TABS: 1.0000 | ORAL_TABLET | Freq: Every day | ORAL | 4 refills | Status: DC

## 2021-07-22 NOTE — Progress Notes (Signed)
? ? ?Brief Narrative  ? ?Patient ID: Jerry Johnson, male    DOB: [birth year], 31 y.o.   MRN: [id number] ? ?Jerry Johnson is a 31 y/o AA diagnosed with HIV on 09/20/20 with risk factor of bisexual contact. Genotype with Subtype B and no significant resistance. No history of opportunistic infection. Initial CD4 count of 220 and viral load >10 million likely reflecting acute infection. Enters care at [facility] Stage 2. Sole ART regimen of Biktarvy. ? ?Subjective:  ?  ?Chief Complaint  ?Patient presents with  ? Follow-up  ? ? ?HPI: ? ?Jerry Johnson is a 31 y.o. male with HIV disease last seen on 04/29/21 with well-controlled virus and good adherence and tolerance to his ART regimen of Biktarvy.  Viral load was 61 with CD4 count of 812.  Kidney function, liver function, electrolytes within normal ranges.  Here today for routine follow-up. ? ?Jerry Johnson continues to take his [place] Odor daily as prescribed and has questions about what to do for missed doses.  Feeling okay today. Denies fevers, chills, night sweats, headaches, changes in vision, neck pain/stiffness, nausea, diarrhea, vomiting, lesions or rashes. ? ?Jerry Johnson has no problems obtaining medication from the pharmacy and with financial assistance up-to-date.  Has been feeling down recently due to the recent passing of his grandmother about 2 weeks ago.  Has been coping okay. No current recreational or illicit drug use with continued marijuana and tobacco use. Condoms offered. Working at [employer] part time and actively seeking additional employment.  ? ? ?No Known Allergies ? ? ? ?Outpatient Medications Prior to Visit  ?Medication Sig Dispense Refill  ? bictegravir-emtricitabine-tenofovir AF (BIKTARVY) 50-200-25 MG TABS tablet Take 1 tablet by mouth daily. 30 tablet 4  ? ibuprofen (ADVIL) 600 MG tablet Take 1 tablet (600 mg total) by mouth every 6 (six) hours as needed for moderate pain. (Patient not taking: Reported on 07/22/2021) 30 tablet 0   ? ?No facility-administered medications prior to visit.  ? ? ? ?Past Medical History:  ?Diagnosis Date  ? Depression   ? UNSPECIFIED HYDROCELE 03/12/2009  ? Qualifier: Diagnosis of  By: Netty Starring  MD, Lucianne Muss    ? ? ? ?Past Surgical History:  ?Procedure Laterality Date  ? INGUINAL HERNIA REPAIR  03/21/09  ? Repair of R inguinal hernia with hydrocele  ? ? ? ? ?Review of Systems  ?Constitutional:  Negative for appetite change, chills, fatigue, fever and unexpected weight change.  ?Eyes:  Negative for visual disturbance.  ?Respiratory:  Negative for cough, chest tightness, shortness of breath and wheezing.   ?Cardiovascular:  Negative for chest pain and leg swelling.  ?Gastrointestinal:  Negative for abdominal pain, constipation, diarrhea, nausea and vomiting.  ?Genitourinary:  Negative for dysuria, flank pain, frequency, genital sores, hematuria and urgency.  ?Skin:  Negative for rash.  ?Allergic/Immunologic: Negative for immunocompromised state.  ?Neurological:  Negative for dizziness and headaches.  ?   ?Objective:  ?  ?BP 118/76   Pulse 66   Temp 98 ?F (36.7 ?C) (Oral)   Wt 164 lb (74.4 kg)   BMI 22.87 kg/m?  ?Nursing note and vital signs reviewed. ? ?Physical Exam ?Constitutional:   ?   General: He is not in acute distress. ?   Appearance: He is well-developed.  ?   Comments: Seated in the chair; pleasant.   ?Eyes:  ?   Conjunctiva/sclera: Conjunctivae normal.  ?Cardiovascular:  ?   Rate and Rhythm: Normal rate and regular rhythm.  ?   Heart  sounds: Normal heart sounds. No murmur heard. ?  No friction rub. No gallop.  ?Pulmonary:  ?   Effort: Pulmonary effort is normal. No respiratory distress.  ?   Breath sounds: Normal breath sounds. No wheezing or rales.  ?Chest:  ?   Chest wall: No tenderness.  ?Abdominal:  ?   General: Bowel sounds are normal.  ?   Palpations: Abdomen is soft.  ?   Tenderness: There is no abdominal tenderness.  ?Musculoskeletal:  ?   Cervical back: Neck supple.  ?Lymphadenopathy:  ?    Cervical: No cervical adenopathy.  ?Skin: ?   General: Skin is warm and dry.  ?   Findings: No rash.  ?Neurological:  ?   Mental Status: He is alert and oriented to person, place, and time.  ?Psychiatric:     ?   Mood and Affect: Mood is depressed.  ? ? ? ? ?  07/22/2021  ? 11:13 AM 02/03/2021  ?  3:00 PM 12/16/2020  ? 11:08 AM 09/23/2020  ?  2:17 PM 06/07/2013  ?  2:48 PM  ?Depression screen PHQ 2/9  ?Decreased Interest 0 1 0 0 0  ?Down, Depressed, Hopeless 0 3 0 0 0  ?PHQ - 2 Score 0 4 0 0 0  ?Altered sleeping  3     ?Tired, decreased energy  3     ?Change in appetite  3     ?Feeling bad or failure about yourself   2     ?Trouble concentrating  0     ?Moving slowly or fidgety/restless  3     ?Suicidal thoughts  0     ?PHQ-9 Score  18     ?Difficult doing work/chores  Extremely dIfficult     ?  ?   ?Assessment & Plan:  ? ? ?Patient Active Problem List  ? Diagnosis Date Noted  ? Healthcare maintenance 11/15/2020  ? HIV disease ([place]) 09/23/2020  ? Adjustment disorder with mixed anxiety and depressed mood 10/30/2016  ? Suicidal ideation 10/30/2016  ? Major depressive disorder, recurrent severe without psychotic features ([place]) 10/29/2016  ? Rash and nonspecific skin eruption 01/10/2014  ? Otitis externa of both ears 01/10/2014  ? Acute pharyngitis 06/07/2013  ? Right groin pain 05/27/2012  ? CONTACT DERMATITIS 12/12/2008  ? ALLERGIC RHINITIS 01/11/2008  ? ATTENTION DEFICIT, W/HYPERACTIVITY 06/03/2006  ? ? ? ?Problem List Items Addressed This Visit   ? ?  ? Other  ? Major depressive disorder, recurrent severe without psychotic features ([place])  ?  Jerry Johnson is doing okay and denies suicidal ideations and has no signs of psychosis.  Depression likely exacerbated by recent passing of his grandmother.  Once again offered counseling.  Resources discussed and when to seek additional care.  Continue to monitor. ? ?  ?  ? HIV disease ([place]) - Primary  ?  Jerry Johnson continues to have well-controlled virus with good adherence  and tolerance to his ART regimen of Biktarvy.  Reviewed previous lab work and discussed plan of care.  Continue current dose of Biktarvy.  Check blood work on Thursday as he is unable to stay today.  Plan for follow-up in 3 months or sooner if needed with lab work on the same day. ? ?  ?  ? Relevant Medications  ? bictegravir-emtricitabine-tenofovir AF (BIKTARVY) 50-200-25 MG TABS tablet  ? Healthcare maintenance  ?  Discussed importance of safe sexual practices and condom use.  Condoms offered. ?Declines immunizations. ? ?  ?  ? ? ? ?  I am having Jerry Johnson maintain his ibuprofen and Biktarvy. ? ? ?Meds ordered this encounter  ?Medications  ? bictegravir-emtricitabine-tenofovir AF (BIKTARVY) 50-200-25 MG TABS tablet  ?  Sig: Take 1 tablet by mouth daily.  ?  Dispense:  30 tablet  ?  Refill:  4  ?  Order Specific Question:   Supervising Provider  ?  Answer:   Carlyle Basques [[id number]]  ? ? ? ?Follow-up: Return in about 3 months (around 10/21/2021). ? ? ?Jerry Piedra, MSN, FNP-C ?Nurse Practitioner ?[facility] ?[facility] ?[facility] Main number: [phone] ? ? ?

## 2021-07-22 NOTE — Assessment & Plan Note (Signed)
Mr. Jerry Johnson is doing okay and denies suicidal ideations and has no signs of psychosis.  Depression likely exacerbated by recent passing of his grandmother.  Once again offered counseling.  Resources discussed and when to seek additional care.  Continue to monitor. ?

## 2021-07-22 NOTE — Assessment & Plan Note (Signed)
?   Discussed importance of safe sexual practices and condom use.  Condoms offered. ?? Declines immunizations. ? ?

## 2021-07-22 NOTE — Assessment & Plan Note (Signed)
Jerry Johnson continues to have well-controlled virus with good adherence and tolerance to his ART regimen of Biktarvy.  Reviewed previous lab work and discussed plan of care.  Continue current dose of Biktarvy.  Check blood work on Thursday as he is unable to stay today.  Plan for follow-up in 3 months or sooner if needed with lab work on the same day. ?

## 2021-07-22 NOTE — Patient Instructions (Addendum)
Nice to see you. ? ?We will check your lab work Thursday ? ?Continue to take your medication daily as prescribed. ? ?Refills have been sent to the pharmacy. ? ?Plan for follow up in 3 months or sooner if needed with lab work on the same day. ? ?Have a great day and stay safe! ? ?

## 2021-07-24 ENCOUNTER — Other Ambulatory Visit: Payer: Medicaid Other

## 2021-10-02 ENCOUNTER — Other Ambulatory Visit: Payer: Self-pay

## 2021-10-02 ENCOUNTER — Ambulatory Visit (INDEPENDENT_AMBULATORY_CARE_PROVIDER_SITE_OTHER): Payer: Self-pay | Admitting: Family

## 2021-10-02 ENCOUNTER — Encounter: Payer: Self-pay | Admitting: Family

## 2021-10-02 VITALS — BP 129/79 | HR 90 | Temp 98.2°F | Wt 164.0 lb

## 2021-10-02 DIAGNOSIS — F332 Major depressive disorder, recurrent severe without psychotic features: Secondary | ICD-10-CM

## 2021-10-02 DIAGNOSIS — Z0000 Encounter for general adult medical examination without abnormal findings: Secondary | ICD-10-CM

## 2021-10-02 DIAGNOSIS — B20 Human immunodeficiency virus [HIV] disease: Secondary | ICD-10-CM

## 2021-10-02 DIAGNOSIS — Z113 Encounter for screening for infections with a predominantly sexual mode of transmission: Secondary | ICD-10-CM

## 2021-10-02 MED ORDER — BIKTARVY 50-200-25 MG PO TABS: 1.0000 | ORAL_TABLET | Freq: Every day | ORAL | 4 refills | Status: DC

## 2021-10-02 MED ORDER — MIRTAZAPINE 15 MG PO TABS: 15.0000 mg | ORAL_TABLET | Freq: Every day | ORAL | 1 refills | Status: DC

## 2021-10-02 NOTE — Assessment & Plan Note (Signed)
Jerry Johnson continues to have feelings of being down and depressed with no suicidal ideations or signs of psychosis.  We discussed potential treatment options including counseling and medication.  Given his difficulty in falling asleep we will try mirtazapine 15 mg nightly at bedtime.  He did mention that he has tried Xanax in the past which has helped him tremendously.  Advised to seek emergency care if symptoms worsen or develops a plan of suicide.  Plan for follow-up in 1 month or sooner if needed.

## 2021-10-02 NOTE — Patient Instructions (Addendum)
Nice to see you.  We will check your lab work today.  Continue to take your medication daily as prescribed.  Refills have been sent to the pharmacy.  Plan for follow up in 1 months or sooner if needed with lab work on the same day.  Have a great day and stay safe!  

## 2021-10-02 NOTE — Assessment & Plan Note (Signed)
Jerry Johnson continues to have adequately controlled virus with good adherence and tolerance to Biktarvy.  Reviewed lab work and discussed plan of care.  Check blood work today.  Continue current dose of Biktarvy.  Renew financial assistance after July 1.  Plan for follow-up in 1 month or sooner if needed.

## 2021-10-02 NOTE — Progress Notes (Signed)
Brief Narrative   Patient ID: Jerry Johnson, male    DOB: 1990/04/17, 31 y.o.   MRN: [id number]  Mr. Jerry Johnson is a 31 y/o AA diagnosed with HIV on 09/20/20 with risk factor of bisexual contact. Genotype with Subtype B and no significant resistance. No history of opportunistic infection. Initial CD4 count of 220 and viral load >10 million likely reflecting acute infection. Enters care at [facility] Stage 2. Sole ART regimen of Biktarvy.  Subjective:    Chief Complaint  Patient presents with   Follow-up    Given condoms    HPI:  Jerry Johnson is a 32 y.o. male with HIV disease last seen on 07/22/21 with adequately controlled virus and good adherence and tolerance to Biktarvy. Previous viral load was 61 with CD4 count 812. Here today for follow up.  Jerry Johnson continues to take Biktarvy daily as prescribed with no adverse side effects.  Overall feeling well today with no new concerns/complaints. Denies fevers, chills, night sweats, headaches, changes in vision, neck pain/stiffness, nausea, diarrhea, vomiting, lesions or rashes.  Jerry Johnson has no problems obtaining medication from the pharmacy.  Has been having continued feelings of being down/depressed at times with no suicidal ideations.  Having difficulty sleeping at times.  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  Continues to drink alcohol rarely with daily marijuana use and daily tobacco use.  Possibly expecting his first child in November.  Declines vaccinations.  Condoms offered.   No Known Allergies    Outpatient Medications Prior to Visit  Medication Sig Dispense Refill   bictegravir-emtricitabine-tenofovir AF (BIKTARVY) 50-200-25 MG TABS tablet Take 1 tablet by mouth daily. 30 tablet 4   ibuprofen (ADVIL) 600 MG tablet Take 1 tablet (600 mg total) by mouth every 6 (six) hours as needed for moderate pain. (Patient not taking: Reported on 07/22/2021) 30 tablet 0   No facility-administered  medications prior to visit.     Past Medical History:  Diagnosis Date   Depression    UNSPECIFIED HYDROCELE 03/12/2009   Qualifier: Diagnosis of  By: Burnadette Pop  MD, Trisha Mangle       Past Surgical History:  Procedure Laterality Date   INGUINAL HERNIA REPAIR  03/21/09   Repair of R inguinal hernia with hydrocele      Review of Systems  Constitutional:  Negative for appetite change, chills, fatigue, fever and unexpected weight change.  Eyes:  Negative for visual disturbance.  Respiratory:  Negative for cough, chest tightness, shortness of breath and wheezing.   Cardiovascular:  Negative for chest pain and leg swelling.  Gastrointestinal:  Negative for abdominal pain, constipation, diarrhea, nausea and vomiting.  Genitourinary:  Negative for dysuria, flank pain, frequency, genital sores, hematuria and urgency.  Skin:  Negative for rash.  Allergic/Immunologic: Negative for immunocompromised state.  Neurological:  Negative for dizziness and headaches.      Objective:    BP 129/79   Pulse 90   Temp 98.2 F (36.8 C) (Oral)   Wt 164 lb (74.4 kg)   SpO2 94%   BMI 22.87 kg/m  Nursing note and vital signs reviewed.  Physical Exam Constitutional:      General: He is not in acute distress.    Appearance: He is well-developed.  Eyes:     Conjunctiva/sclera: Conjunctivae normal.  Cardiovascular:     Rate and Rhythm: Normal rate and regular rhythm.     Heart sounds: Normal heart sounds. No murmur heard.  No friction rub. No gallop.  Pulmonary:     Effort: Pulmonary effort is normal. No respiratory distress.     Breath sounds: Normal breath sounds. No wheezing or rales.  Chest:     Chest wall: No tenderness.  Abdominal:     General: Bowel sounds are normal.     Palpations: Abdomen is soft.     Tenderness: There is no abdominal tenderness.  Musculoskeletal:     Cervical back: Neck supple.  Lymphadenopathy:     Cervical: No cervical adenopathy.  Skin:    General: Skin  is warm and dry.     Findings: No rash.  Neurological:     Mental Status: He is alert and oriented to person, place, and time.  Psychiatric:        Behavior: Behavior normal.        Thought Content: Thought content normal.        Judgment: Judgment normal.         10/02/2021    3:49 PM 07/22/2021   11:13 AM 02/03/2021    3:00 PM 12/16/2020   11:08 AM 09/23/2020    2:17 PM  Depression screen PHQ 2/9  Decreased Interest 0 0 1 0 0  Down, Depressed, Hopeless 1 0 3 0 0  PHQ - 2 Score 1 0 4 0 0  Altered sleeping   3    Tired, decreased energy   3    Change in appetite   3    Feeling bad or failure about yourself    2    Trouble concentrating   0    Moving slowly or fidgety/restless   3    Suicidal thoughts   0    PHQ-9 Score   18    Difficult doing work/chores   Extremely dIfficult         Assessment & Plan:    Patient Active Problem List   Diagnosis Date Noted   Healthcare maintenance 11/15/2020   HIV disease ([place]) 09/23/2020   Adjustment disorder with mixed anxiety and depressed mood 10/30/2016   Suicidal ideation 10/30/2016   Major depressive disorder, recurrent severe without psychotic features ([place]) 10/29/2016   Rash and nonspecific skin eruption 01/10/2014   Otitis externa of both ears 01/10/2014   Acute pharyngitis 06/07/2013   Right groin pain 05/27/2012   CONTACT DERMATITIS 12/12/2008   ALLERGIC RHINITIS 01/11/2008   ATTENTION DEFICIT, W/HYPERACTIVITY 06/03/2006     Problem List Items Addressed This Visit       Other   Major depressive disorder, recurrent severe without psychotic features [facility])    Jerry Johnson continues to have feelings of being down and depressed with no suicidal ideations or signs of psychosis.  We discussed potential treatment options including counseling and medication.  Given his difficulty in falling asleep we will try mirtazapine 15 mg nightly at bedtime.  He did mention that he has tried Xanax in the past which has helped him  tremendously.  Advised to seek emergency care if symptoms worsen or develops a plan of suicide.  Plan for follow-up in 1 month or sooner if needed.      Relevant Medications   mirtazapine (REMERON) 15 MG tablet   HIV disease [facility])    Mr. Bretl continues to have adequately controlled virus with good adherence and tolerance to [employer].  Reviewed lab work and discussed plan of care.  Check blood work today.  Continue current dose of Biktarvy.  Renew financial assistance after July 1.  Plan for follow-up in 1 month or sooner if needed.      Relevant Medications   bictegravir-emtricitabine-tenofovir AF (BIKTARVY) 50-200-25 MG TABS tablet   Other Relevant Orders   Comprehensive metabolic panel   T-helper cell (CD4)- ([facility] clinic only)   HIV-1 RNA quant-no reflex-bld   Healthcare maintenance    Discussed importance of safe sexual practices and condom use and family-planning.  Condoms offered. Declines vaccinations.      Other Visit Diagnoses     Screening for STDs (sexually transmitted diseases)    -  Primary   Relevant Orders   RPR        I am having Trygg F. Clinton Sawyer start on mirtazapine. I am also having him maintain his ibuprofen and Biktarvy.   Meds ordered this encounter  Medications   bictegravir-emtricitabine-tenofovir AF (BIKTARVY) 50-200-25 MG TABS tablet    Sig: Take 1 tablet by mouth daily.    Dispense:  30 tablet    Refill:  4    Order Specific Question:   Supervising Provider    Answer:   Drue Second, CYNTHIA [[id number]]   mirtazapine (REMERON) 15 MG tablet    Sig: Take 1 tablet (15 mg total) by mouth at bedtime.    Dispense:  30 tablet    Refill:  1    Order Specific Question:   Supervising Provider    Answer:   Judyann Munson [[id number]]     Follow-up: Return in about 1 month (around 11/01/2021), or if symptoms worsen or fail to improve.   Marcos Eke, MSN, FNP-C Nurse Practitioner [facility]  Main number: [phone]

## 2021-10-02 NOTE — Assessment & Plan Note (Signed)
   Discussed importance of safe sexual practices and condom use and family-planning.  Condoms offered.  Declines vaccinations.

## 2021-10-03 LAB — T-HELPER CELL (CD4) - (RCID CLINIC ONLY)
CD4 % Helper T Cell: 27 % — ABNORMAL LOW (ref 33–65)
CD4 T Cell Abs: 630 /uL (ref 400–1790)

## 2021-10-05 LAB — COMPREHENSIVE METABOLIC PANEL
AG Ratio: 1.5 (calc) (ref 1.0–2.5)
ALT: 12 U/L (ref 9–46)
AST: 17 U/L (ref 10–40)
Albumin: 4.5 g/dL (ref 3.6–5.1)
Alkaline phosphatase (APISO): 84 U/L (ref 36–130)
BUN: 14 mg/dL (ref 7–25)
CO2: 27 mmol/L (ref 20–32)
Calcium: 9.6 mg/dL (ref 8.6–10.3)
Chloride: 106 mmol/L (ref 98–110)
Creat: 0.97 mg/dL (ref 0.60–1.24)
Globulin: 3.1 g/dL (calc) (ref 1.9–3.7)
Glucose, Bld: 83 mg/dL (ref 65–99)
Potassium: 4.1 mmol/L (ref 3.5–5.3)
Sodium: 139 mmol/L (ref 135–146)
Total Bilirubin: 0.6 mg/dL (ref 0.2–1.2)
Total Protein: 7.6 g/dL (ref 6.1–8.1)

## 2021-10-05 LAB — HIV-1 RNA QUANT-NO REFLEX-BLD
HIV 1 RNA Quant: NOT DETECTED Copies/mL
HIV-1 RNA Quant, Log: NOT DETECTED Log cps/mL

## 2021-10-05 LAB — RPR: RPR Ser Ql: NONREACTIVE

## 2021-10-09 ENCOUNTER — Encounter: Payer: Self-pay | Admitting: Family

## 2021-11-03 ENCOUNTER — Ambulatory Visit: Payer: Self-pay | Admitting: Family

## 2021-11-30 ENCOUNTER — Other Ambulatory Visit: Payer: Self-pay | Admitting: Family

## 2021-12-31 ENCOUNTER — Other Ambulatory Visit: Payer: Self-pay | Admitting: Family

## 2021-12-31 NOTE — Telephone Encounter (Signed)
Please advise on refill.

## 2022-02-01 ENCOUNTER — Other Ambulatory Visit: Payer: Self-pay | Admitting: Family

## 2022-02-23 ENCOUNTER — Encounter: Payer: Self-pay | Admitting: Internal Medicine

## 2022-02-23 ENCOUNTER — Ambulatory Visit (INDEPENDENT_AMBULATORY_CARE_PROVIDER_SITE_OTHER): Payer: Self-pay | Admitting: Internal Medicine

## 2022-02-23 ENCOUNTER — Other Ambulatory Visit: Payer: Self-pay

## 2022-02-23 VITALS — BP 137/82 | HR 67 | Temp 98.4°F | Ht 71.0 in | Wt 161.0 lb

## 2022-02-23 DIAGNOSIS — Z23 Encounter for immunization: Secondary | ICD-10-CM

## 2022-02-23 DIAGNOSIS — B20 Human immunodeficiency virus [HIV] disease: Secondary | ICD-10-CM

## 2022-02-23 MED ORDER — BIKTARVY 50-200-25 MG PO TABS: 1.0000 | ORAL_TABLET | Freq: Every day | ORAL | 4 refills | Status: DC

## 2022-02-23 NOTE — Progress Notes (Unsigned)
[facility]     {Desc; male/male:11659}  Transgender? {YES NO:22349}  Do you have medical insurance? {YES NO:22349}  {MISC; INSURANCE COVERAGE:18342}     If yes, doesn't cover prescriptions? {YES [id number]  Active Problems:   * No active hospital problems. *     HPI: Jerry Johnson is a 31 y.o. 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  Anal sex receptive***, insertive***. Contraception**** Oral sex, contraception*** Vaginal penile sex, contraception***  Social: Occupation: Housing: Support: Understanding of HIV: Etoh/drug/tobacco use:  Past Medical History:  Diagnosis Date   Depression    UNSPECIFIED HYDROCELE 03/12/2009   Qualifier: Diagnosis of  By: Burnadette Pop  MD, Trisha Mangle      Past Surgical History:  Procedure Laterality Date   INGUINAL HERNIA REPAIR  03/21/09   Repair of R inguinal hernia with hydrocele    Family History  Problem Relation Age of Onset   Healthy Mother    Healthy Father    Current Outpatient Medications on File Prior to Visit  Medication Sig Dispense Refill   bictegravir-emtricitabine-tenofovir AF (BIKTARVY) 50-200-25 MG TABS tablet Take 1 tablet by mouth daily. 30 tablet 4   mirtazapine (REMERON) 15 MG tablet TAKE 1 TABLET(15 MG) BY MOUTH AT BEDTIME 30 tablet 0   No current facility-administered medications on file prior to visit.    No Known Allergies  {LUNG POSITIVES LIST:21941} {HIV Meds (brief):18939}  Single Tablet Regimen {CHL AMB HIV MEDICATION SINGLE TABLET REGIMENS:210130301}  Integrase Inhibitors {CHL AMB HIV MEDICATION INTEGRASE INHIBITORS:210130302}  Protease Inhibitors {CHL AMB HIV MEDICATIONS PROTEASE INHIBITORS:210130303}  PK Enhancer {CHL AMB HIV MEDICATIONS PK ENHANCER:210130304}  Nucleoside/Nucleotide Reverse Transcriptase Inhibitors (Nukes) {CHL AMB HIV  MEDICATIONS NUCLEOSIDE/NUCLEOTIDE REVERSE TRANSCRIPTASE INHIBITORS (NUKES):210130305}  Non-Nucleoside Reverse Transcriptase Inhibitors (Non-Nukes) {CHL AMB HIV MEDICATIONS NON-NUCLEOSIDE REVERSE TRANSCRIPTASE INHIBITORS (NON-NUKES):210130306}  Legacy Drugs {CHL AMB HIV MEDICATIONS LEGACY DRUGS:210130307}  Have you ever been diagnosed with any opportunistic infections? {CHL AMB HIV OPPORTUNISTIC INFECTIONS:210130300}  Lab Results HIV 1 RNA Quant (Copies/mL)  Date Value  10/02/2021 Not Detected  04/29/2021 61 (H)  02/03/2021 71 (H)   CD4 T Cell Abs (/uL)  Date Value  10/02/2021 630  04/29/2021 812  02/03/2021 578   No results found for: "HIV1GENOSEQ" Lab Results  Component Value Date   WBC 4.6 09/20/2020   HGB 12.3 (L) 09/20/2020   HCT 38.1 (L) 09/20/2020   MCV 76.8 (L) 09/20/2020   PLT 216 09/20/2020    Lab Results  Component Value Date   CREATININE 0.97 10/02/2021   BUN 14 10/02/2021   NA 139 10/02/2021   K 4.1 10/02/2021   CL 106 10/02/2021   CO2 27 10/02/2021   Lab Results  Component Value Date   ALT 12 10/02/2021   AST 17 10/02/2021   ALKPHOS 70 09/20/2020   BILITOT 0.6 10/02/2021    Lab Results  Component Value Date   CHOL 144 09/23/2020   TRIG 172 (H) 09/23/2020   HDL 23 (L) 09/23/2020   LDLCALC 94 09/23/2020   Lab Results  Component Value Date   HAV NON-REACTIVE 09/23/2020   Lab Results  Component Value Date   HEPBSAG NON-REACTIVE 09/23/2020   HEPBSAB NON-REACTIVE 09/23/2020   No results found for: "HCVAB" Lab Results  Component Value Date   CHLAMYDIAWP Negative 09/23/2020   N Negative 09/23/2020   No results found for: "  GCPROBEAPT" No results found for: "QUANTGOLD"  Plan #HIV -Sexually active with women. P1 partner last 3 months, uses condomes  #Vaccination COVID Flu-today Monkeypox PCV-decline Meningitis-decline HepA/HEpB-non immune(has not gotten timmuized) Tdap 08/30/27 Shingle/s  #Health  maintenance -Quantiferon -RPR -HCV -Dysplasia screen F/M -Mammogram  -Colonoscopy    Jerry Earthly, MD [facility]

## 2022-02-24 LAB — T-HELPER CELLS (CD4) COUNT (NOT AT ARMC)
CD4 % Helper T Cell: 27 % — ABNORMAL LOW (ref 33–65)
CD4 T Cell Abs: 670 /uL (ref 400–1790)

## 2022-02-25 LAB — URINE CYTOLOGY ANCILLARY ONLY
Chlamydia: POSITIVE — AB
Comment: NEGATIVE
Comment: NORMAL
Neisseria Gonorrhea: NEGATIVE

## 2022-02-25 LAB — CYTOLOGY, (ORAL, ANAL, URETHRAL) ANCILLARY ONLY
Chlamydia: NEGATIVE
Comment: NEGATIVE
Comment: NORMAL
Neisseria Gonorrhea: NEGATIVE

## 2022-03-01 LAB — CBC WITH DIFFERENTIAL/PLATELET
Absolute Monocytes: 516 cells/uL (ref 200–950)
Basophils Absolute: 34 cells/uL (ref 0–200)
Basophils Relative: 0.5 %
Eosinophils Absolute: 281 cells/uL (ref 15–500)
Eosinophils Relative: 4.2 %
HCT: 39.8 % (ref 38.5–50.0)
Hemoglobin: 12.9 g/dL — ABNORMAL LOW (ref 13.2–17.1)
Lymphs Abs: 2767 cells/uL (ref 850–3900)
MCH: 25.5 pg — ABNORMAL LOW (ref 27.0–33.0)
MCHC: 32.4 g/dL (ref 32.0–36.0)
MCV: 78.8 fL — ABNORMAL LOW (ref 80.0–100.0)
MPV: 13 fL — ABNORMAL HIGH (ref 7.5–12.5)
Monocytes Relative: 7.7 %
Neutro Abs: 3102 cells/uL (ref 1500–7800)
Neutrophils Relative %: 46.3 %
Platelets: 193 10*3/uL (ref 140–400)
RBC: 5.05 10*6/uL (ref 4.20–5.80)
RDW: 14.2 % (ref 11.0–15.0)
Total Lymphocyte: 41.3 %
WBC: 6.7 10*3/uL (ref 3.8–10.8)

## 2022-03-01 LAB — HEPATITIS C ANTIBODY: Hepatitis C Ab: NONREACTIVE

## 2022-03-01 LAB — HIV-1 RNA QUANT-NO REFLEX-BLD
HIV 1 RNA Quant: <20 Copies/mL — ABNORMAL HIGH
HIV-1 RNA Quant, Log: <1.3 Log cps/mL — ABNORMAL HIGH

## 2022-03-01 LAB — COMPREHENSIVE METABOLIC PANEL
AG Ratio: 1.6 (calc) (ref 1.0–2.5)
ALT: 12 U/L (ref 9–46)
AST: 18 U/L (ref 10–40)
Albumin: 4.7 g/dL (ref 3.6–5.1)
Alkaline phosphatase (APISO): 83 U/L (ref 36–130)
BUN: 10 mg/dL (ref 7–25)
CO2: 27 mmol/L (ref 20–32)
Calcium: 10 mg/dL (ref 8.6–10.3)
Chloride: 105 mmol/L (ref 98–110)
Creat: 0.94 mg/dL (ref 0.60–1.24)
Globulin: 3 g/dL (calc) (ref 1.9–3.7)
Glucose, Bld: 73 mg/dL (ref 65–99)
Potassium: 4.3 mmol/L (ref 3.5–5.3)
Sodium: 139 mmol/L (ref 135–146)
Total Bilirubin: 0.8 mg/dL (ref 0.2–1.2)
Total Protein: 7.7 g/dL (ref 6.1–8.1)

## 2022-03-01 LAB — QUANTIFERON-TB GOLD PLUS
Mitogen-NIL: >10 IU/mL
NIL: 0.03 IU/mL
QuantiFERON-TB Gold Plus: NEGATIVE
TB1-NIL: 0.04 IU/mL
TB2-NIL: 0 IU/mL

## 2022-03-01 LAB — HEPATITIS B CORE ANTIBODY, TOTAL: Hep B Core Total Ab: NONREACTIVE

## 2022-03-01 LAB — HEPATITIS A ANTIBODY, TOTAL: Hepatitis A AB,Total: NONREACTIVE

## 2022-03-01 LAB — HEPATITIS B SURFACE ANTIGEN: Hepatitis B Surface Ag: NONREACTIVE

## 2022-03-01 LAB — HEPATITIS B SURFACE ANTIBODY,QUALITATIVE: Hep B S Ab: NONREACTIVE

## 2022-03-01 LAB — RPR: RPR Ser Ql: NONREACTIVE

## 2022-03-02 ENCOUNTER — Telehealth: Payer: Self-pay | Admitting: Internal Medicine

## 2022-03-02 ENCOUNTER — Telehealth: Payer: Self-pay

## 2022-03-02 ENCOUNTER — Other Ambulatory Visit: Payer: Self-pay

## 2022-03-02 MED ORDER — DOXYCYCLINE HYCLATE 100 MG PO TABS: 100.0000 mg | ORAL_TABLET | Freq: Two times a day (BID) | ORAL | 0 refills | Status: DC

## 2022-03-02 MED ORDER — DOXYCYCLINE HYCLATE 100 MG PO TABS: 100.0000 mg | ORAL_TABLET | Freq: Two times a day (BID) | ORAL | 0 refills | Status: AC

## 2022-03-02 NOTE — Telephone Encounter (Addendum)
Patient returned phone call and advised of positive chlamydia results. Patient advised doxy sent to pharmacy and given directions. Patient advised no sex until completion of treatment and an additional 7-10 days after treatment. Patient also advised to notify partner so they can be tested and treated  Jerry Johnson [name]

## 2022-03-02 NOTE — Telephone Encounter (Signed)
Urine cytology+ chlamydia. Sent doxy

## 2022-03-02 NOTE — Telephone Encounter (Signed)
Per Dr. Thedore Mins called patient regarding Urine results. Patient pos for Chlamydia will need to pick up doxy rx sent to pharmacy. Attempted to call patient to go over this, but mobil number was not able to complete call at this time. Will send mychart message requesting call back. Juanita Laster, RMA

## 2022-05-26 ENCOUNTER — Ambulatory Visit: Payer: Medicaid Other | Admitting: Family

## 2022-06-10 ENCOUNTER — Other Ambulatory Visit (HOSPITAL_COMMUNITY)
Admission: RE | Admit: 2022-06-10 | Discharge: 2022-06-10 | Disposition: A | Discharge status: Home / Self Care | Payer: Medicaid Other | Source: Ambulatory Visit | Attending: Family | Admitting: Family

## 2022-06-10 ENCOUNTER — Ambulatory Visit (INDEPENDENT_AMBULATORY_CARE_PROVIDER_SITE_OTHER): Payer: Medicaid Other | Admitting: Family

## 2022-06-10 ENCOUNTER — Encounter: Payer: Self-pay | Admitting: Family

## 2022-06-10 ENCOUNTER — Other Ambulatory Visit: Payer: Self-pay

## 2022-06-10 VITALS — BP 116/70 | HR 66 | Temp 97.5°F | Ht 71.0 in | Wt 162.0 lb

## 2022-06-10 DIAGNOSIS — F332 Major depressive disorder, recurrent severe without psychotic features: Secondary | ICD-10-CM | POA: Diagnosis not present

## 2022-06-10 DIAGNOSIS — Z0000 Encounter for general adult medical examination without abnormal findings: Secondary | ICD-10-CM | POA: Diagnosis not present

## 2022-06-10 DIAGNOSIS — Z113 Encounter for screening for infections with a predominantly sexual mode of transmission: Secondary | ICD-10-CM

## 2022-06-10 DIAGNOSIS — B20 Human immunodeficiency virus [HIV] disease: Secondary | ICD-10-CM | POA: Diagnosis not present

## 2022-06-10 MED ORDER — MIRTAZAPINE 15 MG PO TABS: ORAL_TABLET | ORAL | 4 refills | Status: DC

## 2022-06-10 MED ORDER — BIKTARVY 50-200-25 MG PO TABS: 1.0000 | ORAL_TABLET | Freq: Every day | ORAL | 4 refills | Status: DC

## 2022-06-10 NOTE — Patient Instructions (Addendum)
Nice to see you.  We will check your lab work today.  Continue to take your medication daily as prescribed.  Please call [facility]) to schedule/follow up on your dental care at [phone]  Refills have been sent to the pharmacy.  Plan for follow up in 4 months or sooner if needed with lab work on the same day.  Have a great day and stay safe!

## 2022-06-10 NOTE — Progress Notes (Unsigned)
Brief Narrative   Patient ID: Jerry Johnson, male    DOB: 11/29/1990, 32 y.o.   MRN: [id number]  Jerry Johnson is a 32 y/o AA diagnosed with HIV on 09/20/20 with risk factor of bisexual contact. Genotype with Subtype B and no significant resistance. No history of opportunistic infection. Initial CD4 count of 220 and viral load >10 million likely reflecting acute infection. Enters care at [facility] Stage 2. Sole ART regimen of Biktarvy.   Subjective:    No chief complaint on file.   HPI:  Jerry Johnson is a 32 y.o. male with HIV disease last seen on 02/23/22 by Jerry Johnson with good adherence and tolerance to [facility]. Viral load was undetectable and CD4 count 670. STD testing was positive for chlamydia and treated with doxycycline. Renal function, liver function and electrolytes were within normal ranges. Continued on mirtazapine for depression and difficulty falling asleep.   Jerry Johnson has been doing well since his last office visit and taking medications as prescribed with no adverse side effects. Mirtazipine is working well and helping him sleep and has to take it by a certain time otherwise it makes him drowsy in the mornings. Feeling well overall and requesting to see the dentist. 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 Condoms and STD testing offered. Declines vaccines. Continues to smoke tobacco and marijuana some days.   Denies fevers, chills, night sweats, headaches, changes in vision, neck pain/stiffness, nausea, diarrhea, vomiting, lesions or rashes.   No Known Allergies    Outpatient Medications Prior to Visit  Medication Sig Dispense Refill   bictegravir-emtricitabine-tenofovir AF (BIKTARVY) 50-200-25 MG TABS tablet Take 1 tablet by mouth daily. 30 tablet 4   mirtazapine (REMERON) 15 MG tablet TAKE 1 TABLET(15 MG) BY MOUTH AT BEDTIME 30 tablet 0   No facility-administered medications prior to visit.     Past Medical History:  Diagnosis  Date   Depression    UNSPECIFIED HYDROCELE 03/12/2009   Qualifier: Diagnosis of  By: Netty Starring  MD, Lucianne Muss       Past Surgical History:  Procedure Laterality Date   INGUINAL HERNIA REPAIR  03/21/09   Repair of R inguinal hernia with hydrocele      Review of Systems  Constitutional:  Negative for appetite change, chills, fatigue, fever and unexpected weight change.  Eyes:  Negative for visual disturbance.  Respiratory:  Negative for cough, chest tightness, shortness of breath and wheezing.   Cardiovascular:  Negative for chest pain and leg swelling.  Gastrointestinal:  Negative for abdominal pain, constipation, diarrhea, nausea and vomiting.  Genitourinary:  Negative for dysuria, flank pain, frequency, genital sores, hematuria and urgency.  Skin:  Negative for rash.  Allergic/Immunologic: Negative for immunocompromised state.  Neurological:  Negative for dizziness and headaches.      Objective:    There were no vitals taken for this visit. Nursing note and vital signs reviewed.  Physical Exam Constitutional:      General: He is not in acute distress.    Appearance: He is well-developed.  Eyes:     Conjunctiva/sclera: Conjunctivae normal.  Cardiovascular:     Rate and Rhythm: Normal rate and regular rhythm.     Heart sounds: Normal heart sounds. No murmur heard.    No friction rub. No gallop.  Pulmonary:     Effort: Pulmonary effort is normal. No respiratory distress.     Breath sounds: Normal breath sounds. No wheezing or rales.  Chest:  Chest wall: No tenderness.  Abdominal:     General: Bowel sounds are normal.     Palpations: Abdomen is soft.     Tenderness: There is no abdominal tenderness.  Musculoskeletal:     Cervical back: Neck supple.  Lymphadenopathy:     Cervical: No cervical adenopathy.  Skin:    General: Skin is warm and dry.     Findings: No rash.  Neurological:     Mental Status: He is alert and oriented to person, place, and time.   Psychiatric:        Behavior: Behavior normal.        Thought Content: Thought content normal.        Judgment: Judgment normal.         02/23/2022    4:08 PM 10/02/2021    3:49 PM 07/22/2021   11:13 AM 02/03/2021    3:00 PM 12/16/2020   11:08 AM  Depression screen PHQ 2/9  Decreased Interest 0 0 0 1 0  Down, Depressed, Hopeless 0 1 0 3 0  PHQ - 2 Score 0 1 0 4 0  Altered sleeping    3   Tired, decreased energy    3   Change in appetite    3   Feeling bad or failure about yourself     2   Trouble concentrating    0   Moving slowly or fidgety/restless    3   Suicidal thoughts    0   PHQ-9 Score    18   Difficult doing work/chores    Extremely dIfficult        Assessment & Plan:    Patient Active Problem List   Diagnosis Date Noted   Healthcare maintenance 11/15/2020   HIV disease ([place]) 09/23/2020   Adjustment disorder with mixed anxiety and depressed mood 10/30/2016   Suicidal ideation 10/30/2016   Major depressive disorder, recurrent severe without psychotic features ([place]) 10/29/2016   Rash and nonspecific skin eruption 01/10/2014   Otitis externa of both ears 01/10/2014   Acute pharyngitis 06/07/2013   Right groin pain 05/27/2012   CONTACT DERMATITIS 12/12/2008   ALLERGIC RHINITIS 01/11/2008   ATTENTION DEFICIT, W/HYPERACTIVITY 06/03/2006     Problem List Items Addressed This Visit   None    I am having Jerry Johnson maintain his mirtazapine and Biktarvy.   No orders of the defined types were placed in this encounter.    Follow-up: No follow-ups on file.   Terri Piedra, MSN, FNP-C Nurse Practitioner [facility] [place] number: [phone]

## 2022-06-11 ENCOUNTER — Encounter: Payer: Self-pay | Admitting: Family

## 2022-06-11 LAB — CYTOLOGY, (ORAL, ANAL, URETHRAL) ANCILLARY ONLY
Chlamydia: NEGATIVE
Chlamydia: POSITIVE — AB
Comment: NEGATIVE
Comment: NEGATIVE
Comment: NORMAL
Comment: NORMAL
Neisseria Gonorrhea: NEGATIVE
Neisseria Gonorrhea: NEGATIVE

## 2022-06-11 LAB — T-HELPER CELL (CD4) - (RCID CLINIC ONLY)
CD4 % Helper T Cell: 28 % — ABNORMAL LOW (ref 33–65)
CD4 T Cell Abs: 1070 /uL (ref 400–1790)

## 2022-06-11 LAB — URINE CYTOLOGY ANCILLARY ONLY
Chlamydia: NEGATIVE
Comment: NEGATIVE
Comment: NORMAL
Neisseria Gonorrhea: NEGATIVE

## 2022-06-11 NOTE — Assessment & Plan Note (Signed)
Jerry Johnson is feeling better and sleeping is improved with the addition of mirtazapine. Does have some drowsiness if he takes the medication later at night. Denies suicidal ideations and has no signs of psychosis. Discussed role of counseling in addition to medication. Continue current dose of mirtazapine.

## 2022-06-11 NOTE — Assessment & Plan Note (Addendum)
Discussed importance of safe sexual practice and condom use. Condoms and STD testing offered.  Declines vaccinations.  Refer to [facility] for dental care.  Introduced anal pap smear for anal cancer screening. Will consider for next office visit.

## 2022-06-11 NOTE — Assessment & Plan Note (Signed)
Jerry Johnson continues to have well controlled virus with good adherence and tolerance to [employer]. Reviewed lab work and discussed plan of care. Check lab work. Continue current dose of Biktarvy. Plan for follow up in 4 months or sooner if needed with lab work on the same day.

## 2022-06-12 ENCOUNTER — Telehealth: Payer: Self-pay

## 2022-06-12 LAB — HIV-1 RNA QUANT-NO REFLEX-BLD
HIV 1 RNA Quant: NOT DETECTED Copies/mL
HIV-1 RNA Quant, Log: NOT DETECTED Log cps/mL

## 2022-06-12 LAB — BASIC METABOLIC PANEL WITH GFR
BUN: 14 mg/dL (ref 7–25)
CO2: 26 mmol/L (ref 20–32)
Calcium: 10.2 mg/dL (ref 8.6–10.3)
Chloride: 105 mmol/L (ref 98–110)
Creat: 0.93 mg/dL (ref 0.60–1.26)
Glucose, Bld: 105 mg/dL — ABNORMAL HIGH (ref 65–99)
Potassium: 4.2 mmol/L (ref 3.5–5.3)
Sodium: 139 mmol/L (ref 135–146)
eGFR: 113 mL/min/{1.73_m2} (ref >=60)

## 2022-06-12 LAB — RPR: RPR Ser Ql: NONREACTIVE

## 2022-06-12 MED ORDER — DOXYCYCLINE HYCLATE 100 MG PO TABS: 100.0000 mg | ORAL_TABLET | Freq: Two times a day (BID) | ORAL | 0 refills | Status: DC

## 2022-06-12 NOTE — Telephone Encounter (Signed)
Called [facility] to relay results and need for treatment, no answer. Left HIPAA compliant voicemail requesting callback.   Beryle Flock, RN

## 2022-06-12 NOTE — Telephone Encounter (Signed)
Rectal swab positive for chlamydia. Routing to provider for orders.   Beryle Flock, RN

## 2022-06-12 NOTE — Addendum Note (Signed)
Addended by: Mauricio Po D on: 06/12/2022 09:22 AM   Modules accepted: Orders

## 2022-06-12 NOTE — Telephone Encounter (Signed)
Please inform Jerry Johnson that he is positive for Chlamydia and prescription for doxycycline has been sent to the pharmacy for him to pick up.

## 2022-06-12 NOTE — Telephone Encounter (Signed)
Patient informed of positive chlamydia results and verbalized understanding. Patient advised doxy has been sent to the pharmacy.  Jerry Johnson

## 2022-10-14 ENCOUNTER — Ambulatory Visit: Payer: Medicaid Other | Admitting: Family

## 2022-12-14 ENCOUNTER — Ambulatory Visit: Payer: Medicaid Other | Admitting: Family

## 2022-12-17 ENCOUNTER — Other Ambulatory Visit: Payer: Self-pay | Admitting: Family

## 2022-12-17 DIAGNOSIS — B20 Human immunodeficiency virus [HIV] disease: Secondary | ICD-10-CM

## 2023-01-04 ENCOUNTER — Ambulatory Visit: Payer: Medicaid Other | Admitting: Family

## 2023-04-06 ENCOUNTER — Other Ambulatory Visit: Payer: Self-pay | Admitting: Family

## 2023-04-06 DIAGNOSIS — B20 Human immunodeficiency virus [HIV] disease: Secondary | ICD-10-CM

## 2023-04-08 ENCOUNTER — Telehealth: Payer: Self-pay

## 2023-04-08 NOTE — Telephone Encounter (Signed)
 I left a voicemail on patient's primary number requesting a call back to schedule an appointment. Phone discounted on secondary number.

## 2023-05-12 ENCOUNTER — Other Ambulatory Visit: Payer: Self-pay | Admitting: Family

## 2023-05-12 DIAGNOSIS — B20 Human immunodeficiency virus [HIV] disease: Secondary | ICD-10-CM

## 2023-05-24 ENCOUNTER — Telehealth: Payer: Self-pay

## 2023-05-24 NOTE — Telephone Encounter (Signed)
Left patient a voice mail to call back to schedule after receiving an appointment request from patient. Patient has missed 3 appointments with Jeanine Luz and will need to be scheduled with with one of our pharmacist.

## 2023-07-04 ENCOUNTER — Other Ambulatory Visit: Payer: Self-pay | Admitting: Family

## 2023-07-04 DIAGNOSIS — B20 Human immunodeficiency virus [HIV] disease: Secondary | ICD-10-CM

## 2023-07-05 ENCOUNTER — Telehealth: Payer: Self-pay

## 2023-07-05 NOTE — Telephone Encounter (Signed)
Appt 4/1

## 2023-07-05 NOTE — Progress Notes (Unsigned)
 HPI: Jerry Johnson is a 33 y.o. male who presents to the [facility] pharmacy clinic for HIV follow-up.  Patient Active Problem List   Diagnosis Date Noted   Healthcare maintenance 11/15/2020   HIV disease ([facility]) 09/23/2020   Adjustment disorder with mixed anxiety and depressed mood 10/30/2016   Suicidal ideation 10/30/2016   Major depressive disorder, 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   Rash and nonspecific skin eruption 01/10/2014   Otitis externa of both ears 01/10/2014   Acute pharyngitis 06/07/2013   Right groin pain 05/27/2012   CONTACT DERMATITIS 12/12/2008   ALLERGIC RHINITIS 01/11/2008   ATTENTION DEFICIT, W/HYPERACTIVITY 06/03/2006    Patient's Medications  New Prescriptions   No medications on file  Previous Medications   BICTEGRAVIR-EMTRICITABINE-TENOFOVIR AF (BIKTARVY) 50-200-25 MG TABS TABLET    Take 1 tablet by mouth daily. NO FURTHER REFILLS UNTIL SEEN IN OFFICE   DOXYCYCLINE (VIBRA-TABS) 100 MG TABLET    Take 1 tablet (100 mg total) by mouth 2 (two) times daily.   MIRTAZAPINE (REMERON) 15 MG TABLET    TAKE 1 TABLET(15 MG) BY MOUTH AT BEDTIME  Modified Medications   No medications on file  Discontinued Medications   No medications on file    Labs: Lab Results  Component Value Date   HIV1RNAQUANT Not Detected 06/10/2022   HIV1RNAQUANT <20 (H) 02/23/2022   HIV1RNAQUANT Not Detected 10/02/2021   CD4TABS 1,070 06/10/2022   CD4TABS 670 02/23/2022   CD4TABS 630 10/02/2021    RPR and STI Lab Results  Component Value Date   LABRPR NON-REACTIVE 06/10/2022   LABRPR NON-REACTIVE 02/23/2022   LABRPR NON-REACTIVE 10/02/2021   LABRPR NON-REACTIVE 04/29/2021   LABRPR NON REACTIVE 09/20/2020    STI Results GC CT  06/10/2022  3:14 PM Negative    Negative    Negative  Negative    Negative    Positive   02/23/2022  4:29 PM Negative  Positive   02/23/2022  4:28 PM Negative  Negative   09/23/2020  4:39 PM Negative  Negative    06/22/2020  2:39 AM Negative  Negative   02/12/2015 12:00 AM Negative  **POSITIVE**     Hepatitis B Lab Results  Component Value Date   HEPBSAB NON-REACTIVE 02/23/2022   HEPBSAG NON-REACTIVE 02/23/2022   HEPBCAB NON-REACTIVE 02/23/2022   Hepatitis C Lab Results  Component Value Date   HEPCAB NON-REACTIVE 02/23/2022   Hepatitis A Lab Results  Component Value Date   HAV NON-REACTIVE 02/23/2022   Lipids: Lab Results  Component Value Date   CHOL 144 09/23/2020   TRIG 172 (H) 09/23/2020   HDL 23 (L) 09/23/2020   CHOLHDL 6.3 (H) 09/23/2020   LDLCALC 94 09/23/2020    Current HIV Regimen: Biktarvy  Assessment: Jerry Johnson presents to the [facility] pharmacy clinic for follow up for HIV. He follows with Marcos Eke, FNP, for B20 management. Last office visit was 06/10/2022, at which his CD4 count was preserved >1,000, and HIV RNA undetectable. He also tested positive for chlamydia at that time, for which he was treated with doxycycline. He reports no concerns for STI at this time. He reports using protection, not active since January. He requests STI testing today.  A review of Biktarvy fill history shows a 67% of total days covered in the last 6 months, and he is currently due for refills. Jerry Johnson reports no issues with adverse effects, and he has been out for about 3 days. Discussed potential barriers to clinic follow up. He reports that  he has no major barriers to clinic follow up. He endorses understanding of the importance of adherence to ART and clinic follow up.  Immunizations: Due for COVID, pneumococcal, Hepatitis A/B, and MenACWY, HPV. He declines immunizations today.  Will update labs, and schedule follow up with his managing provider, Marcos Eke, FNP.   Plan: - Update CD4, CBC w/ diff, HIV RNA, CMP, HCV ab - STI testing: RPR, gonorrhea/chlamydia oral/urine/rectal NAAT - Biktarvy refills to [facility] #[id number] to last until next visit - Follow up with Marcos Eke, FNP, on  09/16/23 - Call with any questions or concerns  Lora Paula, PharmD PGY-2 Infectious Diseases Pharmacy Resident [facility]

## 2023-07-05 NOTE — Telephone Encounter (Signed)
 Pt called to reschedule appt. He stated that he requested a f/u but did not hear anything back. I did notify him that we have been trying to reach him, but have not been able to reach him. He then informed us that it was probably due to his number change.   Updated pt's contact and scheduled with pharmacy for tomorrow afternoon due to multiple no shows.   He did ask for refills and if they could be sent in. I did state that I could I sent this to the clinical team for review, for he is scheduled for a f/u tomorrow afternoon. He did request to speak to a nurse regarding his refills and if those can be sent in today. Routing clinical team.

## 2023-07-06 ENCOUNTER — Ambulatory Visit (INDEPENDENT_AMBULATORY_CARE_PROVIDER_SITE_OTHER): Admitting: Pharmacist

## 2023-07-06 ENCOUNTER — Other Ambulatory Visit (HOSPITAL_COMMUNITY)
Admission: RE | Admit: 2023-07-06 | Discharge: 2023-07-06 | Disposition: A | Discharge status: Home / Self Care | Source: Ambulatory Visit | Attending: Family | Admitting: Family

## 2023-07-06 ENCOUNTER — Other Ambulatory Visit: Payer: Self-pay

## 2023-07-06 DIAGNOSIS — Z113 Encounter for screening for infections with a predominantly sexual mode of transmission: Secondary | ICD-10-CM | POA: Diagnosis not present

## 2023-07-06 DIAGNOSIS — B20 Human immunodeficiency virus [HIV] disease: Secondary | ICD-10-CM

## 2023-07-06 MED ORDER — BIKTARVY 50-200-25 MG PO TABS: 1.0000 | ORAL_TABLET | Freq: Every day | ORAL | 2 refills | Status: DC

## 2023-07-07 LAB — URINE CYTOLOGY ANCILLARY ONLY
Chlamydia: NEGATIVE
Comment: NEGATIVE
Comment: NORMAL
Neisseria Gonorrhea: NEGATIVE

## 2023-07-07 LAB — CYTOLOGY, (ORAL, ANAL, URETHRAL) ANCILLARY ONLY
Chlamydia: NEGATIVE
Chlamydia: NEGATIVE
Comment: NEGATIVE
Comment: NEGATIVE
Comment: NORMAL
Comment: NORMAL
Neisseria Gonorrhea: NEGATIVE
Neisseria Gonorrhea: NEGATIVE

## 2023-07-08 LAB — CBC WITH DIFFERENTIAL/PLATELET
Absolute Lymphocytes: 2190 {cells}/uL (ref 850–3900)
Absolute Monocytes: 503 {cells}/uL (ref 200–950)
Basophils Absolute: 30 {cells}/uL (ref 0–200)
Basophils Relative: 0.4 %
Eosinophils Absolute: 443 {cells}/uL (ref 15–500)
Eosinophils Relative: 5.9 %
HCT: 44.1 % (ref 38.5–50.0)
Hemoglobin: 14 g/dL (ref 13.2–17.1)
MCH: 26.1 pg — ABNORMAL LOW (ref 27.0–33.0)
MCHC: 31.7 g/dL — ABNORMAL LOW (ref 32.0–36.0)
MCV: 82.3 fL (ref 80.0–100.0)
MPV: 12.6 fL — ABNORMAL HIGH (ref 7.5–12.5)
Monocytes Relative: 6.7 %
Neutro Abs: 4335 {cells}/uL (ref 1500–7800)
Neutrophils Relative %: 57.8 %
Platelets: 158 10*3/uL (ref 140–400)
RBC: 5.36 10*6/uL (ref 4.20–5.80)
RDW: 14.6 % (ref 11.0–15.0)
Total Lymphocyte: 29.2 %
WBC: 7.5 10*3/uL (ref 3.8–10.8)

## 2023-07-08 LAB — COMPREHENSIVE METABOLIC PANEL WITH GFR
AG Ratio: 1.6 (calc) (ref 1.0–2.5)
ALT: 12 U/L (ref 9–46)
AST: 22 U/L (ref 10–40)
Albumin: 4.7 g/dL (ref 3.6–5.1)
Alkaline phosphatase (APISO): 81 U/L (ref 36–130)
BUN: 10 mg/dL (ref 7–25)
CO2: 26 mmol/L (ref 20–32)
Calcium: 9.7 mg/dL (ref 8.6–10.3)
Chloride: 105 mmol/L (ref 98–110)
Creat: 0.67 mg/dL (ref 0.60–1.26)
Globulin: 2.9 g/dL (ref 1.9–3.7)
Glucose, Bld: 85 mg/dL (ref 65–99)
Potassium: 4.2 mmol/L (ref 3.5–5.3)
Sodium: 139 mmol/L (ref 135–146)
Total Bilirubin: 1 mg/dL (ref 0.2–1.2)
Total Protein: 7.6 g/dL (ref 6.1–8.1)
eGFR: 128 mL/min/{1.73_m2} (ref >=60)

## 2023-07-08 LAB — HIV-1 RNA QUANT-NO REFLEX-BLD
HIV 1 RNA Quant: NOT DETECTED {copies}/mL
HIV-1 RNA Quant, Log: NOT DETECTED {Log_copies}/mL

## 2023-07-08 LAB — RPR: RPR Ser Ql: NONREACTIVE

## 2023-07-08 LAB — HEPATITIS C ANTIBODY: Hepatitis C Ab: NONREACTIVE

## 2023-07-31 ENCOUNTER — Other Ambulatory Visit: Payer: Self-pay

## 2023-07-31 ENCOUNTER — Encounter (HOSPITAL_COMMUNITY): Payer: Self-pay

## 2023-07-31 ENCOUNTER — Emergency Department (HOSPITAL_COMMUNITY)
Admission: EM | Admit: 2023-07-31 | Discharge: 2023-08-01 | Disposition: A | Discharge status: Other Acute Inpatient Hospital | Attending: Emergency Medicine | Admitting: Emergency Medicine

## 2023-07-31 DIAGNOSIS — F411 Generalized anxiety disorder: Secondary | ICD-10-CM | POA: Insufficient documentation

## 2023-07-31 DIAGNOSIS — Y907 Blood alcohol level of 200-239 mg/100 ml: Secondary | ICD-10-CM | POA: Diagnosis not present

## 2023-07-31 DIAGNOSIS — F1010 Alcohol abuse, uncomplicated: Secondary | ICD-10-CM | POA: Insufficient documentation

## 2023-07-31 DIAGNOSIS — R45851 Suicidal ideations: Secondary | ICD-10-CM | POA: Diagnosis not present

## 2023-07-31 DIAGNOSIS — F3113 Bipolar disorder, current episode manic without psychotic features, severe: Secondary | ICD-10-CM | POA: Diagnosis not present

## 2023-07-31 DIAGNOSIS — E876 Hypokalemia: Secondary | ICD-10-CM | POA: Diagnosis not present

## 2023-07-31 DIAGNOSIS — F319 Bipolar disorder, unspecified: Secondary | ICD-10-CM | POA: Insufficient documentation

## 2023-07-31 LAB — CBC WITH DIFFERENTIAL/PLATELET
Abs Immature Granulocytes: 0.06 10*3/uL (ref 0.00–0.07)
Basophils Absolute: 0 10*3/uL (ref 0.0–0.1)
Basophils Relative: 0 %
Eosinophils Absolute: 0.2 10*3/uL (ref 0.0–0.5)
Eosinophils Relative: 2 %
HCT: 45.5 % (ref 39.0–52.0)
Hemoglobin: 14.9 g/dL (ref 13.0–17.0)
Immature Granulocytes: 1 %
Lymphocytes Relative: 16 %
Lymphs Abs: 1.6 10*3/uL (ref 0.7–4.0)
MCH: 26.3 pg (ref 26.0–34.0)
MCHC: 32.7 g/dL (ref 30.0–36.0)
MCV: 80.4 fL (ref 80.0–100.0)
Monocytes Absolute: 0.6 10*3/uL (ref 0.1–1.0)
Monocytes Relative: 6 %
Neutro Abs: 7.7 10*3/uL (ref 1.7–7.7)
Neutrophils Relative %: 75 %
Platelets: 213 10*3/uL (ref 150–400)
RBC: 5.66 MIL/uL (ref 4.22–5.81)
RDW: 15.8 % — ABNORMAL HIGH (ref 11.5–15.5)
WBC: 10.2 10*3/uL (ref 4.0–10.5)
nRBC: 0 % (ref 0.0–0.2)

## 2023-07-31 LAB — COMPREHENSIVE METABOLIC PANEL WITH GFR
ALT: 24 U/L (ref 0–44)
AST: 70 U/L — ABNORMAL HIGH (ref 15–41)
Albumin: 4.7 g/dL (ref 3.5–5.0)
Alkaline Phosphatase: 105 U/L (ref 38–126)
Anion gap: 13 (ref 5–15)
BUN: 14 mg/dL (ref 6–20)
CO2: 22 mmol/L (ref 22–32)
Calcium: 9.2 mg/dL (ref 8.9–10.3)
Chloride: 105 mmol/L (ref 98–111)
Creatinine, Ser: 0.86 mg/dL (ref 0.61–1.24)
GFR, Estimated: >60 mL/min (ref >60)
Glucose, Bld: 98 mg/dL (ref 70–99)
Potassium: 2.8 mmol/L — ABNORMAL LOW (ref 3.5–5.1)
Sodium: 140 mmol/L (ref 135–145)
Total Bilirubin: 1.6 mg/dL — ABNORMAL HIGH (ref 0.0–1.2)
Total Protein: 8.5 g/dL — ABNORMAL HIGH (ref 6.5–8.1)

## 2023-07-31 LAB — SALICYLATE LEVEL: Salicylate Lvl: <7 mg/dL — ABNORMAL LOW (ref 7.0–30.0)

## 2023-07-31 LAB — ETHANOL: Alcohol, Ethyl (B): 209 mg/dL — ABNORMAL HIGH (ref <15)

## 2023-07-31 LAB — ACETAMINOPHEN LEVEL: Acetaminophen (Tylenol), Serum: <10 ug/mL — ABNORMAL LOW (ref 10–30)

## 2023-07-31 LAB — MAGNESIUM: Magnesium: 2.1 mg/dL (ref 1.7–2.4)

## 2023-07-31 MED ORDER — POTASSIUM CHLORIDE CRYS ER 20 MEQ PO TBCR
40.0000 meq | EXTENDED_RELEASE_TABLET | Freq: Once | ORAL | Status: AC
  Administered 2023-07-31: 40 meq via ORAL
  Filled 2023-07-31: qty 2

## 2023-07-31 MED ORDER — ZIPRASIDONE MESYLATE 20 MG IM SOLR
10.0000 mg | Freq: Once | INTRAMUSCULAR | Status: AC
Start: 2023-07-31 — End: 2023-07-31
  Administered 2023-07-31: 10 mg via INTRAMUSCULAR
  Filled 2023-07-31: qty 20

## 2023-07-31 MED ORDER — STERILE WATER FOR INJECTION IJ SOLN
INTRAMUSCULAR | Status: AC
  Administered 2023-07-31: 1.2 mL
  Filled 2023-07-31: qty 10
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
## 2023-07-31 NOTE — ED Notes (Signed)
Pt refusing to answer triage questions

## 2023-07-31 NOTE — ED Notes (Signed)
 Patient was dressed out into purple scrubs with socks placed.  Belongings placed in patient bags and labeled.
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
## 2023-07-31 NOTE — ED Notes (Signed)
 Upon arrival to unit, pt immediately went to sleep in bed. Respirations equal and unlabored, NAD. Care ongoing.

## 2023-07-31 NOTE — ED Notes (Signed)
 Patient belongings placed in locker 35. Two bags

## 2023-08-01 ENCOUNTER — Inpatient Hospital Stay (HOSPITAL_COMMUNITY)
Admission: AD | Admit: 2023-08-01 | Discharge: 2023-08-08 | DRG: 885 | Disposition: A | Discharge status: Home / Self Care | Source: Intra-hospital | Attending: Psychiatry | Admitting: Psychiatry

## 2023-08-01 ENCOUNTER — Encounter (HOSPITAL_COMMUNITY): Payer: Self-pay | Admitting: Psychiatry

## 2023-08-01 DIAGNOSIS — F17210 Nicotine dependence, cigarettes, uncomplicated: Secondary | ICD-10-CM | POA: Diagnosis present

## 2023-08-01 DIAGNOSIS — F419 Anxiety disorder, unspecified: Secondary | ICD-10-CM | POA: Diagnosis present

## 2023-08-01 DIAGNOSIS — E559 Vitamin D deficiency, unspecified: Secondary | ICD-10-CM | POA: Insufficient documentation

## 2023-08-01 DIAGNOSIS — R45851 Suicidal ideations: Secondary | ICD-10-CM | POA: Diagnosis present

## 2023-08-01 DIAGNOSIS — F411 Generalized anxiety disorder: Secondary | ICD-10-CM | POA: Insufficient documentation

## 2023-08-01 DIAGNOSIS — Z79899 Other long term (current) drug therapy: Secondary | ICD-10-CM | POA: Diagnosis not present

## 2023-08-01 DIAGNOSIS — B20 Human immunodeficiency virus [HIV] disease: Secondary | ICD-10-CM | POA: Diagnosis present

## 2023-08-01 DIAGNOSIS — F3113 Bipolar disorder, current episode manic without psychotic features, severe: Secondary | ICD-10-CM | POA: Diagnosis not present

## 2023-08-01 DIAGNOSIS — R45850 Homicidal ideations: Secondary | ICD-10-CM | POA: Diagnosis present

## 2023-08-01 DIAGNOSIS — Z818 Family history of other mental and behavioral disorders: Secondary | ICD-10-CM | POA: Diagnosis not present

## 2023-08-01 DIAGNOSIS — F319 Bipolar disorder, unspecified: Secondary | ICD-10-CM | POA: Diagnosis present

## 2023-08-01 DIAGNOSIS — E538 Deficiency of other specified B group vitamins: Secondary | ICD-10-CM | POA: Insufficient documentation

## 2023-08-01 DIAGNOSIS — G4700 Insomnia, unspecified: Secondary | ICD-10-CM | POA: Diagnosis present

## 2023-08-01 HISTORY — DX: Bipolar disorder, unspecified: F31.9

## 2023-08-01 HISTORY — DX: Generalized anxiety disorder: F41.1

## 2023-08-01 LAB — COMPREHENSIVE METABOLIC PANEL WITH GFR
ALT: 24 U/L (ref 0–44)
AST: 64 U/L — ABNORMAL HIGH (ref 15–41)
Albumin: 4.5 g/dL (ref 3.5–5.0)
Alkaline Phosphatase: 104 U/L (ref 38–126)
Anion gap: 15 (ref 5–15)
BUN: 17 mg/dL (ref 6–20)
CO2: 22 mmol/L (ref 22–32)
Calcium: 9.6 mg/dL (ref 8.9–10.3)
Chloride: 102 mmol/L (ref 98–111)
Creatinine, Ser: 0.89 mg/dL (ref 0.61–1.24)
GFR, Estimated: >60 mL/min (ref >60)
Glucose, Bld: 78 mg/dL (ref 70–99)
Potassium: 3.9 mmol/L (ref 3.5–5.1)
Sodium: 139 mmol/L (ref 135–145)
Total Bilirubin: 2.4 mg/dL — ABNORMAL HIGH (ref 0.0–1.2)
Total Protein: 8.3 g/dL — ABNORMAL HIGH (ref 6.5–8.1)

## 2023-08-01 LAB — RAPID URINE DRUG SCREEN, HOSP PERFORMED
Amphetamines: NOT DETECTED
Barbiturates: NOT DETECTED
Benzodiazepines: NOT DETECTED
Cocaine: NOT DETECTED
Opiates: NOT DETECTED
Tetrahydrocannabinol: POSITIVE — AB

## 2023-08-01 MED ORDER — MAGNESIUM HYDROXIDE 400 MG/5ML PO SUSP: 30.0000 mL | Freq: Every day | ORAL | Status: DC | PRN

## 2023-08-01 MED ORDER — ACETAMINOPHEN 325 MG PO TABS: 650.0000 mg | ORAL_TABLET | Freq: Four times a day (QID) | ORAL | Status: DC | PRN

## 2023-08-01 MED ORDER — LORAZEPAM 1 MG PO TABS: 1.0000 mg | ORAL_TABLET | Freq: Four times a day (QID) | ORAL | Status: AC | PRN

## 2023-08-01 MED ORDER — DIVALPROEX SODIUM ER 500 MG PO TB24
500.0000 mg | ORAL_TABLET | Freq: Every day | ORAL | Status: DC
Start: 2023-08-01 — End: 2023-08-01
  Administered 2023-08-01: 500 mg via ORAL
  Filled 2023-08-01: qty 1

## 2023-08-01 MED ORDER — HALOPERIDOL LACTATE 5 MG/ML IJ SOLN
5.0000 mg | Freq: Three times a day (TID) | INTRAMUSCULAR | Status: DC | PRN
Start: 2023-08-01 — End: 2023-08-08

## 2023-08-01 MED ORDER — HALOPERIDOL 5 MG PO TABS: 5.0000 mg | ORAL_TABLET | Freq: Three times a day (TID) | ORAL | Status: DC | PRN

## 2023-08-01 MED ORDER — DIVALPROEX SODIUM ER 500 MG PO TB24
500.0000 mg | ORAL_TABLET | Freq: Every day | ORAL | Status: DC
  Administered 2023-08-02: 500 mg via ORAL
  Filled 2023-08-01 (×3): qty 1

## 2023-08-01 MED ORDER — HALOPERIDOL LACTATE 5 MG/ML IJ SOLN: 10.0000 mg | Freq: Three times a day (TID) | INTRAMUSCULAR | Status: DC | PRN

## 2023-08-01 MED ORDER — VITAMIN B-1 100 MG PO TABS
100.0000 mg | ORAL_TABLET | Freq: Every day | ORAL | Status: DC
  Administered 2023-08-03 – 2023-08-06 (×4): 100 mg via ORAL
  Filled 2023-08-01 (×6): qty 1

## 2023-08-01 MED ORDER — RISPERIDONE 0.5 MG PO TBDP
1.0000 mg | ORAL_TABLET | Freq: Two times a day (BID) | ORAL | Status: DC
  Administered 2023-08-01: 1 mg via ORAL
  Filled 2023-08-01: qty 2

## 2023-08-01 MED ORDER — LORAZEPAM 1 MG PO TABS: 1.0000 mg | ORAL_TABLET | Freq: Four times a day (QID) | ORAL | Status: DC | PRN

## 2023-08-01 MED ORDER — DIPHENHYDRAMINE HCL 50 MG/ML IJ SOLN: 50.0000 mg | Freq: Three times a day (TID) | INTRAMUSCULAR | Status: DC | PRN

## 2023-08-01 MED ORDER — ADULT MULTIVITAMIN W/MINERALS CH
1.0000 | ORAL_TABLET | Freq: Every day | ORAL | Status: DC
  Administered 2023-08-03 – 2023-08-06 (×4): 1 via ORAL
  Filled 2023-08-01 (×6): qty 1

## 2023-08-01 MED ORDER — LORAZEPAM 2 MG/ML IJ SOLN: 2.0000 mg | Freq: Three times a day (TID) | INTRAMUSCULAR | Status: DC | PRN

## 2023-08-01 MED ORDER — BICTEGRAVIR-EMTRICITAB-TENOFOV 50-200-25 MG PO TABS
1.0000 | ORAL_TABLET | Freq: Every day | ORAL | Status: DC
  Administered 2023-08-01 – 2023-08-08 (×8): 1 via ORAL
  Filled 2023-08-01 (×12): qty 1

## 2023-08-01 MED ORDER — THIAMINE MONONITRATE 100 MG PO TABS
100.0000 mg | ORAL_TABLET | Freq: Every day | ORAL | Status: DC
  Administered 2023-08-01: 100 mg via ORAL
  Filled 2023-08-01: qty 1

## 2023-08-01 MED ORDER — MIRTAZAPINE 15 MG PO TABS
15.0000 mg | ORAL_TABLET | Freq: Every day | ORAL | Status: DC
  Administered 2023-08-01: 15 mg via ORAL
  Filled 2023-08-01 (×4): qty 1

## 2023-08-01 MED ORDER — ONDANSETRON 4 MG PO TBDP: 4.0000 mg | ORAL_TABLET | Freq: Four times a day (QID) | ORAL | Status: DC | PRN

## 2023-08-01 MED ORDER — THIAMINE HCL 100 MG/ML IJ SOLN: 100.0000 mg | Freq: Once | INTRAMUSCULAR | Status: DC

## 2023-08-01 MED ORDER — THIAMINE MONONITRATE 100 MG PO TABS: 100.0000 mg | ORAL_TABLET | Freq: Every day | ORAL | Status: DC

## 2023-08-01 MED ORDER — ADULT MULTIVITAMIN W/MINERALS CH
1.0000 | ORAL_TABLET | Freq: Every day | ORAL | Status: DC
  Administered 2023-08-01: 1 via ORAL
  Filled 2023-08-01: qty 1

## 2023-08-01 MED ORDER — LOPERAMIDE HCL 2 MG PO CAPS: 2.0000 mg | ORAL_CAPSULE | ORAL | Status: DC | PRN

## 2023-08-01 MED ORDER — HYDROXYZINE HCL 25 MG PO TABS: 25.0000 mg | ORAL_TABLET | Freq: Four times a day (QID) | ORAL | Status: DC | PRN

## 2023-08-01 MED ORDER — LOPERAMIDE HCL 2 MG PO CAPS: 2.0000 mg | ORAL_CAPSULE | ORAL | Status: AC | PRN

## 2023-08-01 MED ORDER — HYDROXYZINE HCL 25 MG PO TABS
25.0000 mg | ORAL_TABLET | Freq: Four times a day (QID) | ORAL | Status: AC | PRN
  Administered 2023-08-02 – 2023-08-03 (×2): 25 mg via ORAL
  Filled 2023-08-01 (×2): qty 1

## 2023-08-01 MED ORDER — RISPERIDONE 1 MG PO TBDP
1.0000 mg | ORAL_TABLET | Freq: Two times a day (BID) | ORAL | Status: DC
  Administered 2023-08-01: 1 mg via ORAL
  Filled 2023-08-01 (×8): qty 1

## 2023-08-01 MED ORDER — ALUM & MAG HYDROXIDE-SIMETH 200-200-20 MG/5ML PO SUSP: 30.0000 mL | ORAL | Status: DC | PRN

## 2023-08-01 MED ORDER — ONDANSETRON 4 MG PO TBDP: 4.0000 mg | ORAL_TABLET | Freq: Four times a day (QID) | ORAL | Status: AC | PRN

## 2023-08-01 MED ORDER — TRAZODONE HCL 50 MG PO TABS
50.0000 mg | ORAL_TABLET | Freq: Every evening | ORAL | Status: DC | PRN
  Administered 2023-08-02: 50 mg via ORAL
  Filled 2023-08-01: qty 1

## 2023-08-01 MED ORDER — DIPHENHYDRAMINE HCL 25 MG PO CAPS: 50.0000 mg | ORAL_CAPSULE | Freq: Three times a day (TID) | ORAL | Status: DC | PRN

## 2023-08-01 MED ORDER — HYDROXYZINE HCL 25 MG PO TABS: 25.0000 mg | ORAL_TABLET | Freq: Three times a day (TID) | ORAL | Status: DC | PRN

## 2023-08-01 NOTE — Progress Notes (Signed)
 [facility] LCSW Progress Note   08/01/2023    12:09 PM  Jerry Johnson   [id number]   Type of Contact and Topic:  Psychiatric Bed Placement   Pt accepted to [facility] 401-1    Patient meets inpatient criteria per Chandra Come, PMHNP  The attending provider will be Dr. Zouev  Call report to [phone]    Neldon Baltimore, Paramedic @ [facility] ED notified.     Pt scheduled  to arrive at [facility] TODAY.    Teralyn Mullins, MSW, LCSW-A  12:10 PM 08/01/2023
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
## 2023-08-01 NOTE — Tx Team (Signed)
 Initial Treatment Plan 08/01/2023 4:55 PM ARSLAN COPUS HUD:[id number]    PATIENT STRESSORS: Legal issue     PATIENT STRENGTHS: Average or above average intelligence  Communication skills  Physical Health  Supportive family/friends  Work skills    PATIENT IDENTIFIED PROBLEMS: Suicidal ideation (per mother)    Anxiety    Mood lability    Legal concern         DISCHARGE CRITERIA:  Improved stabilization in mood, thinking, and/or behavior Need for constant or close observation no longer present Reduction of life-threatening or endangering symptoms to within safe limits Verbal commitment to aftercare and medication compliance  PRELIMINARY DISCHARGE PLAN: Outpatient therapy Return to previous living arrangement  PATIENT/FAMILY INVOLVEMENT: This treatment plan has been presented to and reviewed with the patient, Jerry Johnson, The patient has been given the opportunity to ask questions and make suggestions.  Victorine Grater, RN 08/01/2023, 4:55 PM

## 2023-08-01 NOTE — Progress Notes (Signed)
 Adult Psychoeducational Group Note  Date:  08/01/2023 Time:  8:35 PM  Group Topic/Focus:  Wrap-Up Group:   The focus of this group is to help patients review their daily goal of treatment and discuss progress on daily workbooks.  Participation Level:  Active  Participation Quality:  Appropriate  Affect:  Appropriate  Cognitive:  Appropriate  Insight: Appropriate  Engagement in Group:  Engaged  Modes of Intervention:  Discussion  Additional Comments:  Pt stated his day was alright.  Aquilla Bayley 08/01/2023, 8:35 PM

## 2023-08-01 NOTE — Consult Note (Signed)
 [facility] Psychiatric Consult Initial  Patient Name: .Jerry Johnson  MRN: [id number]  DOB: 09/28/1990  Consult Order details:  Orders (From admission, onward)     Start     Ordered   07/31/23 2230  CONSULT TO CALL ACT TEAM       Ordering Provider: Royann Cords, PA  Provider:  (Not yet assigned)  Question:  Reason for Consult?  Answer:  si   07/31/23 2229             Mode of Visit: In person    Psychiatry Consult Evaluation  Service Date: August 01, 2023 LOS:  LOS: 0 days  Chief Complaint SI  Primary Psychiatric Diagnoses  Bipolar disorder Generalized anxiety disorder  Assessment  Jerry Johnson is a 33 y.o. male admitted: Presented to the EDfor 07/31/2023  6:56 PM for suicidal ideations. He carries the psychiatric diagnoses of bipolar disorder and anxiety and has a past medical history of HIV.   His current presentation of rapid and pressured speech, restlessness, flight of ideas is most consistent with mania. He meets criteria for inpatient psychiatric admission based on current symptoms.  Current outpatient psychotropic medications include none.  Patient is non compliant with medications prior to admission as evidenced by patient's mother. On initial examination, patient demonstrating rapid and pressured speech, and flight of ideas, but is redirectable and cooperative. Please see plan below for detailed recommendations.   Diagnoses:  Active Hospital problems: Principal Problem:   Bipolar disorder (HCC) Active Problems:   Generalized anxiety disorder    Plan   ## Psychiatric Medication Recommendations:  Start Depakote 500 mg daily p.o. for mood stability Start Risperdal 1 mg twice daily p.o. for mood and manic behavior  ## Medical Decision Making Capacity: Not specifically addressed in this encounter  ## Further Work-up:  -- No further workup needed at this time EKG, While pt on Qtc prolonging medications, please monitor & replete K+ to 4 and  Mg2+ to 2, or UDS -- most recent EKG on 07/31/23 had QtC of 454 -- Pertinent labwork reviewed earlier this admission includes: CBC, CMP, EKG, UDS   ## Disposition:-- We recommend inpatient psychiatric hospitalization after medical hospitalization. Patient has been involuntarily committed on 08/01/23.   ## Behavioral / Environmental: -To minimize splitting of staff, assign one staff person to communicate all information from the team when feasible. or Utilize compassion and acknowledge the patient's experiences while setting clear and realistic expectations for care.    ## Safety and Observation Level:  - Based on my clinical evaluation, I estimate the patient to be at low risk of self harm in the current setting. - At this time, we recommend  routine. This decision is based on my review of the chart including patient's history and current presentation, interview of the patient, mental status examination, and consideration of suicide risk including evaluating suicidal ideation, plan, intent, suicidal or self-harm behaviors, risk factors, and protective factors. This judgment is based on our ability to directly address suicide risk, implement suicide prevention strategies, and develop a safety plan while the patient is in the clinical setting. Please contact our team if there is a concern that risk level has changed.  CSSR Risk Category:C-SSRS RISK CATEGORY: No Risk  Suicide Risk Assessment: Patient has following modifiable risk factors for suicide: active suicidal ideation, recklessness, and medication noncompliance, which we are addressing by recommending inpatient psychiatric admission. Patient has following non-modifiable or demographic risk factors for suicide: male gender and history of  suicide attempt Patient has the following protective factors against suicide: Supportive family  Thank you for this consult request. Recommendations have been communicated to the primary team.  We will  recommend inpatient psychiatric admission and continue to follow patient at this time.   Chandra Come, PMHNP       History of Present Illness  Relevant Aspects of Hospital ED Course:  Admitted on 07/31/2023 for suicidal ideations and attempted to jump out of his mother's car.  Patient Report:  Jerry Johnson, 33 y.o., male patient seen face to face by this provider, consulted with Dr. Deborah Falling; and chart reviewed on 08/01/23.  On evaluation patient is standing in his room, restless and anxious. He is alert, oriented x 4, anxious, cooperative, with inattentiveness. His mood is excited with congruent affect. He has pressured speech, with flights of ideas.  Patient repeats over and over, that he does not need to be here in the hospital, attempted to get this provider to believe that if anyone needs to be here it is his family and not him.  Patient states that he feels his mother gets him committed all the time, due to spite, and he states he is ready to go home.  He states that he has a Archivist date tomorrow, states that is why he is so anxious because he feels he cannot miss his or else he will get sent to jail.  He also states he is beginning starting job, then states he has an interview.  Patient currently denies SI/HI/AVH, endorses only using marijuana as illicit substance, states he drinks alcohol occasionally, except for last night, states he has been $500 on a cookout that he was hallucinating and he had some alcohol.  UDS is positive for THC, BAL is 209.  Patient states his appetite and sleep are fair.  States he has not been resting due to his upcoming court date, states he is anxious.  Patient denies that he has any psychiatric diagnoses or any psychiatric history.  Objectively, patient appears manic and disorganized although he doesn't appear to be responding to internal stimuli. Patient is unable to reliably contract for safety and meets criteria for inpatient psychiatric  treatment for medication stabilization and safety.    Psych ROS:  Depression: Denies Anxiety: Positive Mania (lifetime and current): Current  Psychosis: (lifetime and current): Denies  Collateral information:  Contacted patient's mother Steffon Tayman on 08/01/23, and she stated that, her son needs help, she states that he is bipolar and he just recently lost his job.  Says he has been walking around threatening family members especially his brothers and uncles, states he has been "acting strange, off" for the past 2 weeks, but she states she brushed it off, until he started sending threatening text messages and making threatening calls to family members.  She says that he currently was living with his brother, but his brother is moving out of their apartment due to patient's behavior, and now patient will be homeless.  She confirms that patient only uses marijuana and alcohol to her knowledge.  Confirmed that patient has bipolar and ADHD which was diagnosed in his early 47s.  She states that patient will not stay on any medications as he has been prescribed Risperdal, Depakote, and lithium before.  Review of Systems  Psychiatric/Behavioral:         Hyperverbal, rapid, pressured speech, flight of ideas     Psychiatric and Social History  Psychiatric History:  Information collected from patient and  mother  Prev Dx/Sx: Bipolar and anxiety Current Psych Provider: None Home Meds (current): None Previous Med Trials: Yes Therapy: None  Prior Psych Hospitalization: Denies Prior Self Harm: Yes Prior Violence: Yes  Family Psych History: Denies Family Hx suicide: Denies  Social History:  Developmental Hx: Deferred Educational Hx: Patient graduated from high school Occupational Hx: Patient recently worked at [employer] Hx: Yes Living Situation: Lived with with brother, once patient is discharge will probably be homeless Spiritual Hx: Yes Access to weapons/lethal means:  Denies  Substance History Alcohol: Yes Type of alcohol liquor, beer Last Drink yesterday Number of drinks per day unknown History of alcohol withdrawal seizures denies History of DT's denies Tobacco: Yes Illicit drugs: Yes Prescription drug abuse: Denies Rehab hx: Denies  Exam Findings  Physical Exam:  Vital Signs:  Temp:  [97.8 F (36.6 C)-98.3 F (36.8 C)] 98.3 F (36.8 C) (04/27 1119) Pulse Rate:  [81-95] 90 (04/27 1119) Resp:  [12-18] 17 (04/27 1119) BP: (130-163)/(75-98) 163/98 (04/27 1119) SpO2:  [96 %-100 %] 100 % (04/27 1119) Blood pressure (!) 163/98, pulse 90, temperature 98.3 F (36.8 C), temperature source Oral, resp. rate 17, SpO2 100%. There is no height or weight on file to calculate BMI.  Physical Exam Vitals and nursing note reviewed. Exam conducted with a chaperone present.  Psychiatric:        Attention and Perception: He is inattentive.        Mood and Affect: Mood is anxious and elated.        Speech: Speech is rapid and pressured.        Behavior: Behavior is cooperative.        Cognition and Memory: Cognition is impaired.        Judgment: Judgment is inappropriate.     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  Family History:  The patient's family history includes Healthy in his father and mother.  Medical History: Past Medical History:  Diagnosis Date   Depression    UNSPECIFIED HYDROCELE 03/12/2009   Qualifier: Diagnosis of  By: Jerone Moorman  MD, Sarina Curb      Surgical History: Past Surgical History:  Procedure Laterality Date   INGUINAL HERNIA REPAIR  03/21/09   Repair of R inguinal hernia with hydrocele     Medications:   Current Facility-Administered Medications:    divalproex (DEPAKOTE ER) 24 hr tablet 500 mg, 500 mg, Oral, Daily, Motley-Mangrum, Jamelle Noy A, PMHNP, 500 mg at 08/01/23 1041   hydrOXYzine (ATARAX) tablet 25 mg, 25 mg, Oral, Q6H PRN, Motley-Mangrum, Wilmary Levit A, PMHNP  loperamide (IMODIUM) capsule 2-4 mg, 2-4 mg, Oral, PRN, Motley-Mangrum, Paytience Bures A, PMHNP   LORazepam  (ATIVAN ) tablet 1 mg, 1 mg, Oral, Q6H PRN, Motley-Mangrum, Dionta Larke A, PMHNP   multivitamin with minerals tablet 1 tablet, 1 tablet, Oral, Daily, Motley-Mangrum, Amariona Rathje A, PMHNP   ondansetron  (ZOFRAN -ODT) disintegrating tablet 4 mg, 4 mg, Oral, Q6H PRN, Motley-Mangrum, Monaca Wadas A, PMHNP   risperiDONE (RISPERDAL M-TABS) disintegrating tablet 1 mg, 1 mg, Oral, BID, Motley-Mangrum, Kavi Almquist A, PMHNP, 1 mg at 08/01/23 1041   thiamine (VITAMIN B1) injection 100 mg, 100 mg, Intramuscular, Once, Motley-Mangrum, Britany Callicott A, PMHNP   [START ON 08/02/2023] thiamine (VITAMIN B1) tablet 100 mg, 100 mg, Oral, Daily, Motley-Mangrum, Marguerita Stapp A, PMHNP  Current Outpatient Medications:    bictegravir-emtricitabine -tenofovir  AF (BIKTARVY ) 50-200-25 MG TABS tablet, Take 1 tablet by mouth daily. NO FURTHER REFILLS UNTIL SEEN IN OFFICE, Disp: 30 tablet, Rfl: 2   doxycycline  (VIBRA -TABS) 100 MG tablet, Take 1 tablet (100 mg total) by mouth 2 (two) times daily., Disp: 14 tablet, Rfl: 0   mirtazapine  (REMERON ) 15 MG tablet, TAKE 1 TABLET(15 MG) BY  MOUTH AT BEDTIME, Disp: 30 tablet, Rfl: 4  Allergies: No Known Allergies  Lerline Valdivia MOTLEY-MANGRUM, PMHNP

## 2023-08-01 NOTE — BH Assessment (Signed)
 Comprehensive Clinical Assessment (CCA) Note   08/01/2023 Jerry Johnson [id number]  Disposition: Berle Breeding, NP recommends inpatient hospitalization.   The patient demonstrates the following risk factors for suicide: Chronic risk factors for suicide include: psychiatric disorder of Bipolar Disorder  . Acute risk factors for suicide include: family or marital conflict. Protective factors for this patient include: positive social support. Considering these factors, the overall suicide risk at this point appears to be low. Patient is not appropriate for outpatient follow up.   Per EDP's Note : " Jerry Johnson is a 33 y.o. male with past medical history of bipolar presents to emergency department via PD for SI.  Per IVC papers filled out by mother, she reports that he has stated that he wants to "kill myself" and he wants to kill his brother and uncle. Today, patient jumped out of his mother's vehicle while it was moving.  "  On evaluation, patient is alert, oriented x 3, and cooperative. Speech is pressured. Pt presents with racing thoughts. Pt was a poor historian. Pt denies concerns that were stated on IVC.  Pt appears casual. Eye contact is fair. Mood is anxious and affect is congruent with mood. Pt denies SI/HI/AVH. There is no indication that the patient is responding to internal stimuli. No delusions elicited during this assessment.     Chief Complaint:  Chief Complaint  Patient presents with   Psychiatric Evaluation   Visit Diagnosis:  Bipolar     CCA Screening, Triage and Referral (STR)  Patient Reported Information How did you hear about us ? Other (Comment) ([facility] ED)  What Is the Reason for Your Visit/Call Today? Per EDP's Note : " Jerry Johnson is a 33 y.o. male with past medical history of bipolar presents to emergency department via PD for SI.  Per IVC papers filled out by mother, she reports that he has stated that he wants to "kill myself" and he wants  to kill his brother and uncle.       Today, patient jumped out of his mother's vehicle while it was moving.  "  How Long Has This Been Causing You Problems? > than 6 months  What Do You Feel Would Help You the Most Today? Treatment for Depression or other mood problem; Medication(s); Stress Management   Have You Recently Had Any Thoughts About Hurting Yourself? No  Are You Planning to Commit Suicide/Harm Yourself At This time? No   Flowsheet Row ED from 07/31/2023 in [facility] Emergency Department at [facility] ED from [facility]/20/2020 in [facility] Department at [facility] ED from 09/13/2020 in [facility] Department at [facility]  C-SSRS RISK CATEGORY No Risk No Risk No Risk       Have you Recently Had Thoughts About Hurting Someone Marigene Shoulder? No  Are You Planning to Harm Someone at This Time? No  Explanation: Pt denies HI but according to IVC, pt threaten to harm brother and Uncle   Have You Used Any Alcohol or Drugs in the Past 24 Hours? No  How Long Ago Did You Use Drugs or Alcohol? N/a What Did You Use and How Much? N/a  Do You Currently Have a Therapist/Psychiatrist? No  Name of Therapist/Psychiatrist:    Have You Been Recently Discharged From Any Office Practice or Programs? No  Explanation of Discharge From Practice/Program:n/a    CCA Screening Triage Referral Assessment Type of Contact: Tele-Assessment  Telemedicine Service Delivery: Telemedicine service delivery: This service was  provided via telemedicine using a 2-way, interactive audio and video technology  Is this Initial or Reassessment? Is this Initial or Reassessment?: Initial Assessment  Date Telepsych consult ordered in [facility]:  Date Telepsych consult ordered in [facility]: 07/31/23  Time Telepsych consult ordered in [facility]:  Time Telepsych consult ordered in [facility]: 2230  Location of Assessment: [facility] ED  Provider Location: [facility] Assessment Services   Collateral  Involvement: None   Does Patient Have a Automotive engineer Guardian? No  Legal Guardian Contact Information: n/a  Copy of Legal Guardianship Form: -- (n/a)  Legal Guardian Notified of Arrival: -- (n/a)  Legal Guardian Notified of Pending Discharge: -- (n/a)  If Minor and Not Living with Parent(s), Who has Custody? n/a  Is CPS involved or ever been involved? Never  Is APS involved or ever been involved? Never   Patient Determined To Be At Risk for Harm To Self or Others Based on Review of Patient Reported Information or Presenting Complaint? No  Method: No Plan  Availability of Means: No access or NA  Intent: Vague intent or NA  Notification Required: No need or identified person  Additional Information for Danger to Others Potential: -- (n/a)  Additional Comments for Danger to Others Potential: Pt denies HI but according to IVC, pt threaten to harm himself and his brother and uncle.  Are There Guns or Other Weapons in Your Home? No  Types of Guns/Weapons: Denies  Are These Weapons Safely Secured?                            No  Who Could Verify You Are Able To Have These Secured: Denies  Do You Have any Outstanding Charges, Pending Court Dates, Parole/Probation? Pt denies pending legal charges  Contacted To Inform of Risk of Harm To Self or Others: -- (n/a)    Does Patient Present under Involuntary Commitment? No    [place] of Residence: [place]   Patient Currently Receiving the Following Services: Not Receiving Services   Determination of Need: Urgent (48 hours)   Options For Referral: Inpatient Hospitalization     CCA Biopsychosocial Patient Reported Schizophrenia/Schizoaffective Diagnosis in Past: n/a  Strengths: UTA   Mental Health Symptoms Depression:  Change in energy/activity; Difficulty Concentrating; Fatigue; Hopelessness; Tearfulness   Duration of Depressive symptoms: Duration of Depressive Symptoms: Greater than two weeks    Mania:  Racing thoughts; Recklessness   Anxiety:   Worrying; Restlessness; Irritability; Fatigue; Difficulty concentrating   Psychosis:  None   Duration of Psychotic symptoms:    Trauma:  None   Obsessions:  None   Compulsions:  None   Inattention:  None   Hyperactivity/Impulsivity:  N/A   Oppositional/Defiant Behaviors:  None   Emotional Irregularity:  Chronic feelings of emptiness; Unstable self-image; Mood lability   Other Mood/Personality Symptoms:  depressed irritable mood    Mental Status Exam Appearance and self-care  Stature:  Average   Weight:  Average weight   Clothing:  Disheveled   Grooming:  Neglected   Cosmetic use:  Age appropriate   Posture/gait:  Normal   Motor activity:  Agitated   Sensorium  Attention:  Inattentive   Concentration:  Scattered   Orientation:  Object; Person; Place; Situation   Recall/memory:  Normal   Affect and Mood  Affect:  Anxious   Mood:  Depressed   Relating  Eye contact:  None   Facial expression:  Tense; Angry   Attitude toward  examiner:  Cooperative   Thought and Language  Speech flow: Normal   Thought content:  Persecutions   Preoccupation:  None   Hallucinations:  None   Organization:  Coherent   [employer] of Knowledge:  Fair   Intelligence:  Average   Abstraction:  Functional   Judgement:  Fair   Dance movement psychotherapist:  Distorted   Insight:  Lacking   Decision Making:  Impulsive   Social Functioning  Social Maturity:  Impulsive   Social Judgement:  "Chief of Staff"   Stress  Stressors:  Family conflict   Coping Ability:  Deficient supports   Skill Deficits:  Decision making; Responsibility; Self-care; Self-control   Supports:  Support needed     Religion: Religion/Spirituality Are You A Religious Person?: No How Might This Affect Treatment?: UTA  Leisure/Recreation: Leisure / Recreation Do You Have Hobbies?: No  Exercise/Diet: Exercise/Diet Do You  Exercise?: No Have You Gained or Lost A Significant Amount of Weight in the Past Six Months?: No Do You Follow a Special Diet?: No Do You Have Any Trouble Sleeping?: No   CCA Employment/Education Employment/Work Situation: Employment / Work Situation Employment Situation: Employed (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) Work Stressors: None reportede Patient's Job has Been Impacted by Current Illness: No Has Patient ever Been in Equities trader?: No  Education: Education Is Patient Currently Attending School?: No Last Grade Completed: 12 Did You Product manager?: No Did You Have An Individualized Education Program (IIEP): No (UTA) Did You Have Any Difficulty At [employer]?: No (UTA) Patient's Education Has Been Impacted by Current Illness: No   CCA Family/Childhood History Family and Relationship History: Family history Marital status: Single Does patient have children?: No  Childhood History:  Childhood History By whom was/is the patient raised?: Mother Did patient suffer any verbal/emotional/physical/sexual abuse as a child?: No Did patient suffer from severe childhood neglect?: No Has patient ever been sexually abused/assaulted/raped as an adolescent or adult?: No Was the patient ever a victim of a crime or a disaster?: No Witnessed domestic violence?: No Has patient been affected by domestic violence as an adult?: No       CCA Substance Use Alcohol/Drug Use: Alcohol / Drug Use Pain Medications: See MRA Prescriptions: See MRA Over the Counter: See MRA History of alcohol / drug use?: No history of alcohol / drug abuse Longest period of sobriety (when/how long): Pt denies etoh and drug use Negative Consequences of Use: Financial, Personal relationships, Work / Programmer, multimedia Withdrawal Symptoms: None                         ASAM's:  Six Dimensions of Multidimensional Assessment  Dimension 1:  Acute Intoxication and/or Withdrawal Potential:      Dimension 2:   Biomedical Conditions and Complications:      Dimension 3:  Emotional, Behavioral, or Cognitive Conditions and Complications:     Dimension 4:  Readiness to Change:     Dimension 5:  Relapse, Continued use, or Continued Problem Potential:     Dimension 6:  Recovery/Living Environment:     ASAM Severity Score:    ASAM Recommended Level of Treatment: ASAM Recommended Level of Treatment:  (n/a)   Substance use Disorder (SUD) Substance Use Disorder (SUD)  Checklist Symptoms of Substance Use:  (n/a)  Recommendations for Services/Supports/Treatments: Recommendations for Services/Supports/Treatments Recommendations For Services/Supports/Treatments: Medication Management, Inpatient Hospitalization  Disposition Recommendation per psychiatric provider: We recommend inpatient psychiatric hospitalization after medical  hospitalization. Patient has been involuntarily committed on 07/31/23.    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   Rash and nonspecific skin eruption 01/10/2014   Otitis externa of both ears 01/10/2014   Acute pharyngitis 06/07/2013   Right groin pain 05/27/2012   CONTACT DERMATITIS 12/12/2008   ALLERGIC RHINITIS 01/11/2008   ATTENTION DEFICIT, W/HYPERACTIVITY 06/03/2006     Referrals to Alternative Service(s): Referred to Alternative Service(s):   Place:   Date:   Time:    Referred to Alternative Service(s):   Place:   Date:   Time:    Referred to Alternative Service(s):   Place:   Date:   Time:    Referred to Alternative Service(s):   Place:   Date:   Time:     [place] Do, [place], [facility], N[facility]

## 2023-08-01 NOTE — ED Notes (Signed)
 Pt somewhat agitated after speaking with mom

## 2023-08-01 NOTE — ED Notes (Signed)
Pt taking a shower at this time 

## 2023-08-01 NOTE — Plan of Care (Signed)
   Problem: Education: Goal: Knowledge of [facility] Education information/materials will improve Outcome: Progressing Goal: Emotional status will improve Outcome: Progressing Goal: Mental status will improve Outcome: Progressing Goal: Verbalization of understanding the information provided will improve Outcome: Progressing

## 2023-08-01 NOTE — ED Notes (Signed)
 Pt has been calm, cooperative since arrival to unit, no aggressive behavior noted.

## 2023-08-01 NOTE — BH Assessment (Signed)
 TTS attempted to assess pt and was informed that pt was given Geodon  due to aggressive behaviors. Pt is unable to engage in an assessment at this time. TTS will be notified once pt is awake and alert for an assessment.

## 2023-08-01 NOTE — Plan of Care (Signed)
   Problem: Education: Goal: Knowledge of [facility] Education information/materials will improve Outcome: Progressing Goal: Verbalization of understanding the information provided will improve Outcome: Progressing

## 2023-08-02 ENCOUNTER — Encounter (HOSPITAL_COMMUNITY): Payer: Self-pay

## 2023-08-02 MED ORDER — DIVALPROEX SODIUM ER 250 MG PO TB24
750.0000 mg | ORAL_TABLET | Freq: Every day | ORAL | Status: DC
  Filled 2023-08-02 (×2): qty 3

## 2023-08-02 MED ORDER — RISPERIDONE 2 MG PO TBDP
2.0000 mg | ORAL_TABLET | Freq: Two times a day (BID) | ORAL | Status: DC
  Administered 2023-08-02 – 2023-08-04 (×4): 2 mg via ORAL
  Filled 2023-08-02 (×8): qty 1

## 2023-08-02 MED ORDER — NICOTINE 7 MG/24HR TD PT24: 7.0000 mg | MEDICATED_PATCH | Freq: Every day | TRANSDERMAL | Status: DC | PRN

## 2023-08-02 MED ORDER — VALPROIC ACID 250 MG/5ML PO SOLN
500.0000 mg | Freq: Three times a day (TID) | ORAL | Status: DC
  Administered 2023-08-03 – 2023-08-06 (×10): 500 mg via ORAL
  Filled 2023-08-02 (×14): qty 10

## 2023-08-02 MED ORDER — NICOTINE POLACRILEX 2 MG MT GUM: 2.0000 mg | CHEWING_GUM | OROMUCOSAL | Status: DC | PRN
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
## 2023-08-02 NOTE — Plan of Care (Signed)
  Problem: Education: Goal: Knowledge of [facility] Education information/materials will improve Outcome: Progressing Goal: Emotional status will improve Outcome: Progressing Goal: Mental status will improve Outcome: Progressing Goal: Verbalization of understanding the information provided will improve Outcome: Progressing   Problem: Activity: Goal: Interest or engagement in activities will improve Outcome: Progressing Goal: Sleeping patterns will improve Outcome: Progressing   Problem: Coping: Goal: Ability to verbalize frustrations and anger appropriately will improve Outcome: Progressing   Problem: Health Behavior/Discharge Planning: Goal: Identification of resources available to assist in meeting health care needs will improve Outcome: Progressing

## 2023-08-02 NOTE — Group Note (Signed)
 Date:  08/02/2023 Time:  9:29 AM  Group Topic/Focus:  Goals Group:   The focus of this group is to help patients establish daily goals to achieve during treatment and discuss how the patient can incorporate goal setting into their daily lives to aide in recovery.    Participation Level:  Did Not Attend  Jerry Johnson 08/02/2023, 9:29 AM

## 2023-08-02 NOTE — BH IP Treatment Plan (Signed)
 Interdisciplinary Treatment and Diagnostic Plan Update  08/02/2023 Time of Session: 10:50 AM Jerry Johnson MRN: [id number]  Principal Diagnosis: Bipolar 1 disorder (HCC)  Secondary Diagnoses: Principal Problem:   Bipolar 1 disorder (HCC), current episode manic   Current Medications:  Current Facility-Administered Medications  Medication Dose Route Frequency Provider Last Rate Last Admin   acetaminophen  (TYLENOL ) tablet 650 mg  650 mg Oral Q6H PRN Motley-Mangrum, Jadeka A, PMHNP       alum & mag hydroxide-simeth (MAALOX/MYLANTA) 200-200-20 MG/5ML suspension 30 mL  30 mL Oral Q4H PRN Motley-Mangrum, Jadeka A, PMHNP       bictegravir-emtricitabine -tenofovir  AF (BIKTARVY ) 50-200-25 MG per tablet 1 tablet  1 tablet Oral Daily Motley-Mangrum, Jadeka A, PMHNP   1 tablet at 08/02/23 1059   haloperidol (HALDOL) tablet 5 mg  5 mg Oral TID PRN Motley-Mangrum, Jadeka A, PMHNP       And   diphenhydrAMINE (BENADRYL) capsule 50 mg  50 mg Oral TID PRN Motley-Mangrum, Jadeka A, PMHNP       haloperidol lactate (HALDOL) injection 5 mg  5 mg Intramuscular TID PRN Motley-Mangrum, Jadeka A, PMHNP       And   diphenhydrAMINE (BENADRYL) injection 50 mg  50 mg Intramuscular TID PRN Motley-Mangrum, Jadeka A, PMHNP       And   LORazepam  (ATIVAN ) injection 2 mg  2 mg Intramuscular TID PRN Motley-Mangrum, Jadeka A, PMHNP       haloperidol lactate (HALDOL) injection 10 mg  10 mg Intramuscular TID PRN Motley-Mangrum, Jadeka A, PMHNP       And   diphenhydrAMINE (BENADRYL) injection 50 mg  50 mg Intramuscular TID PRN Motley-Mangrum, Jadeka A, PMHNP       And   LORazepam  (ATIVAN ) injection 2 mg  2 mg Intramuscular TID PRN Motley-Mangrum, Jadeka A, PMHNP       hydrOXYzine (ATARAX) tablet 25 mg  25 mg Oral Q6H PRN Motley-Mangrum, Jadeka A, PMHNP       loperamide (IMODIUM) capsule 2-4 mg  2-4 mg Oral PRN Motley-Mangrum, Jadeka A, PMHNP       LORazepam  (ATIVAN ) tablet 1 mg  1 mg Oral Q6H PRN Motley-Mangrum,  Jadeka A, PMHNP       magnesium  hydroxide (MILK OF MAGNESIA) suspension 30 mL  30 mL Oral Daily PRN Motley-Mangrum, Jadeka A, PMHNP       multivitamin with minerals tablet 1 tablet  1 tablet Oral Daily Motley-Mangrum, Jadeka A, PMHNP       nicotine (NICODERM CQ - dosed in mg/24 hr) patch 7 mg  7 mg Transdermal Daily PRN McCarty, Artie, MD       nicotine polacrilex (NICORETTE) gum 2 mg  2 mg Oral PRN McCarty, Artie, MD       ondansetron  (ZOFRAN -ODT) disintegrating tablet 4 mg  4 mg Oral Q6H PRN Motley-Mangrum, Jadeka A, PMHNP       risperiDONE (RISPERDAL M-TABS) disintegrating tablet 2 mg  2 mg Oral BID McCarty, Artie, MD   2 mg at 08/02/23 1706   thiamine (Vitamin B-1) tablet 100 mg  100 mg Oral Daily Motley-Mangrum, Jadeka A, PMHNP       traZODone (DESYREL) tablet 50 mg  50 mg Oral QHS PRN Motley-Mangrum, Jadeka A, PMHNP       valproic acid  (DEPAKENE ) 250 MG/5ML solution 500 mg  500 mg Oral TID McCarty, Artie, MD       PTA Medications: Medications Prior to Admission  Medication Sig Dispense Refill Last Dose/Taking   bictegravir-emtricitabine -tenofovir  AF (BIKTARVY )  50-200-25 MG TABS tablet Take 1 tablet by mouth daily. NO FURTHER REFILLS UNTIL SEEN IN OFFICE 30 tablet 2     Patient Stressors: Legal issue    Patient Strengths: Average or above average intelligence  Communication skills  Physical Health  Supportive family/friends  Work skills   Treatment Modalities: Medication Management, Group therapy, Case management,  1 to 1 session with clinician, Psychoeducation, Recreational therapy.   Physician Treatment Plan for Primary Diagnosis: Bipolar 1 disorder (HCC) Long Term Goal(s):     Short Term Goals:    Medication Management: Evaluate patient's response, side effects, and tolerance of medication regimen.  Therapeutic Interventions: 1 to 1 sessions, Unit Group sessions and Medication administration.  Evaluation of Outcomes: Not Progressing  Physician Treatment Plan for  Secondary Diagnosis: Principal Problem:   Bipolar 1 disorder (HCC), current episode manic  Long Term Goal(s):     Short Term Goals:       Medication Management: Evaluate patient's response, side effects, and tolerance of medication regimen.  Therapeutic Interventions: 1 to 1 sessions, Unit Group sessions and Medication administration.  Evaluation of Outcomes: Not Progressing   RN Treatment Plan for Primary Diagnosis: Bipolar 1 disorder (HCC) Long Term Goal(s): Knowledge of disease and therapeutic regimen to maintain health will improve  Short Term Goals: Ability to remain free from injury will improve, Ability to verbalize frustration and anger appropriately will improve, Ability to demonstrate self-control, Ability to participate in decision making will improve, Ability to verbalize feelings will improve, Ability to disclose and discuss suicidal ideas, Ability to identify and develop effective coping behaviors will improve, and Compliance with prescribed medications will improve  Medication Management: RN will administer medications as ordered by provider, will assess and evaluate patient's response and provide education to patient for prescribed medication. RN will report any adverse and/or side effects to prescribing provider.  Therapeutic Interventions: 1 on 1 counseling sessions, Psychoeducation, Medication administration, Evaluate responses to treatment, Monitor vital signs and CBGs as ordered, Perform/monitor CIWA, COWS, AIMS and Fall Risk screenings as ordered, Perform wound care treatments as ordered.  Evaluation of Outcomes: Not Progressing   LCSW Treatment Plan for Primary Diagnosis: Bipolar 1 disorder (HCC) Long Term Goal(s): Safe transition to appropriate next level of care at discharge, Engage patient in therapeutic group addressing interpersonal concerns.  Short Term Goals: Engage patient in aftercare planning with referrals and resources, Increase social support, Increase  ability to appropriately verbalize feelings, Increase emotional regulation, Facilitate acceptance of mental health diagnosis and concerns, Facilitate patient progression through stages of change regarding substance use diagnoses and concerns, Identify triggers associated with mental health/substance abuse issues, and Increase skills for wellness and recovery  Therapeutic Interventions: Assess for all discharge needs, 1 to 1 time with Social worker, Explore available resources and support systems, Assess for adequacy in community support network, Educate family and significant other(s) on suicide prevention, Complete Psychosocial Assessment, Interpersonal group therapy.  Evaluation of Outcomes: Not Progressing   Progress in Treatment: Attending groups: No. Participating in groups: No. Taking medication as prescribed: Yes. Toleration medication: Yes. Family/Significant other contact made:  No, will contact Brinkley Lacourt (mom) [phone] Patient understands diagnosis:  No  Discussing patient identified problems/goals with staff: No. Medical problems stabilized or resolved: Yes. Denies suicidal/homicidal ideation: Yes. Issues/concerns per patient self-inventory: No.  New problem(s) identified: No  New Short Term/Long Term Goal(s):    medication stabilization, elimination of SI thoughts, development of comprehensive mental wellness plan.    Patient Goals:  "It's unexpected that  I'm here.  My mom should never have me hospitalized.  I was just drinking a little bit and felt tipsy.  She can't handle my emotions."  Discharge Plan or Barriers:  Patient recently admitted. CSW will continue to follow and assess for appropriate referrals and possible discharge planning.    Reason for Continuation of Hospitalization: Mania Medication stabilization Suicidal ideation  Estimated Length of Stay:  5 - 7 days  Last 3 [place] Suicide Severity Risk Score: Flowsheet Row Ad[facility] (Current) [facility]  [facility] ED from 09/20/2020 in [facility] Emergency Department at [facility]  C-SSRS RISK CATEGORY No Risk No Risk No Risk       Last PHQ 2/9 Scores:    06/10/2022    3:01 PM 02/23/2022    4:08 PM 10/02/2021    3:49 PM  Depression screen PHQ 2/9  Decreased Interest 0 0 0  Down, Depressed, Hopeless 0 0 1  PHQ - 2 Score 0 0 1    Scribe for Treatment Team: Shanaiya Bene O Mead [place], LCSWA 08/02/2023 5:57 PM

## 2023-08-02 NOTE — Group Note (Signed)
 Occupational Therapy Group Note  Group Topic: Sleep Hygiene  Group Date: 08/02/2023 Start Time: 1430 End Time: 1504 Facilitators: Lynnda Sas, OT   Group Description: Group encouraged increased participation and engagement through topic focused on sleep hygiene. Patients reflected on the quality of sleep they typically receive and identified areas that need improvement. Group was given background information on sleep and sleep hygiene, including common sleep disorders. Group members also received information on how to improve one's sleep and introduced a sleep diary as a tool that can be utilized to track sleep quality over a length of time. Group session ended with patients identifying one or more strategies they could utilize or implement into their sleep routine in order to improve overall sleep quality.        Therapeutic Goal(s):  Identify one or more strategies to improve overall sleep hygiene  Identify one or more areas of sleep that are negatively impacted (sleep too much, too little, etc)     Participation Level: Engaged   Participation Quality: Independent   Behavior: Appropriate   Speech/Thought Process: Relevant   Affect/Mood: Appropriate   Insight: Fair   Judgement: Fair      Modes of Intervention: Education  Patient Response to Interventions:  Attentive   Plan: Continue to engage patient in OT groups 2 - 3x/week.  08/02/2023  Lynnda Sas, OT   Acsa Estey, OT

## 2023-08-02 NOTE — Progress Notes (Signed)
   08/02/23 1000  Psych Admission Type (Psych Patients Only)  Admission Status Involuntary  Psychosocial Assessment  Patient Complaints Agitation  Eye Contact Brief  Facial Expression Animated;Anxious  Affect Anxious;Apprehensive  Speech Rapid;Pressured  Interaction Assertive;Forwards little  Motor Activity Restless  Appearance/Hygiene Unremarkable  Behavior Characteristics Cooperative  Mood Anxious;Apprehensive  Thought Process  Coherency Circumstantial  Content Blaming others  Delusions None reported or observed  Perception WDL  Hallucination None reported or observed  Judgment Poor  Confusion None  Danger to Self  Current suicidal ideation? Denies  Agreement Not to Harm Self Yes  Description of Agreement verbal  Danger to Others  Danger to Others None reported or observed

## 2023-08-02 NOTE — Group Note (Signed)
 Recreation Therapy Group Note   Group Topic:Stress Management  Group Date: 08/02/2023 Start Time: 0930 End Time: 0454 Facilitators: Gustin Zobrist-McCall, LRT,CTRS Location: [address] Dayroom   Group Topic: Stress Management  Goal Area(s) Addresses:  Patient will identify positive stress management techniques. Patient will identify benefits of using stress management post d/c.  Intervention: Insight Timer App  Activity: Morning Meditation. LRT played a meditation that focused on preparing for the day ahead. The meditation guided patients to envision being on the beach and breathing with the flow of the water washing onto the sand. The meditation also encouraged patients to picture the positives they should expect during the day.    Education:  Stress Management, Discharge Planning.   Education Outcome: Acknowledges Education   Affect/Mood: N/A   Participation Level: Did not attend    Clinical Observations/Individualized Feedback:     Plan: Continue to engage patient in RT group sessions 2-3x/week.   Leona Pressly-McCall, LRT,CTRS 08/02/2023 1:02 PM
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
## 2023-08-02 NOTE — [facility] Group Notes (Signed)
 [facility] Group Notes:  (Nursing/MHT/Case Management/Adjunct)  Date:  08/02/2023  Time:  10:41 PM  Type of Therapy:  Psychoeducational Skills  Participation Level:  Minimal  Participation Quality:  Attentive  Affect:  Flat  Cognitive:  Appropriate  Insight:  Appropriate  Engagement in Group:  Developing/Improving  Modes of Intervention:  Education  Summary of Progress/Problems:The patient rated his day as a 6 out of a possible 10 since he dealt with some "family issues" by phone. His positive event for the day is that he had a good visit with his girlfriend.   Adaly Puder S 08/02/2023, 10:41 PM

## 2023-08-02 NOTE — H&P (Signed)
 Psychiatric Admission Assessment Adult  Patient Identification: [name] MRN:  [id number] Date of Evaluation:  08/02/2023 Chief Complaint:  Bipolar 1 disorder (HCC) [F31.9] Principal Diagnosis: Bipolar 1 disorder (HCC) Diagnosis:  Principal Problem:   Bipolar 1 disorder (HCC), current episode manic    CC: suicidal and homicidal ideation, acute mania  Jerry Johnson is a 33 y.o. male  with a past psychiatric history of MDD. Patient initially arrived to [facility] on 07/31/2023 for suicidal and homicidal thoughts, and admitted to [facility] under IVC on 08/01/2023 for acute safety concerns, crisis stabalization, and intensive therapeutic interventions. PMHx is significant for HIV on Biktarvy .   HPI:  Per IVC from mother "respondent has been diagnosed with bipolar and has been committed in the past.  Respondent has stated that he is sick and does not want to live anymore.  States he wants to kill himself and he is going to kill his brother and uncle.  Responder jumped out of his mother vehicle while it was moving."  Patient reported that prior to admission he was "tipsy and emotional" and he went to his mother to talk about this.  He reported that his mother was unhelpful and instead of helping him she giving him the hospital.  He reports that he was struggling but did not feel that that was an appropriate response and feels that the IVC was inconsistent with his experience.  He reports that the car was not moving when he "jumped" out of it.  He reports that he did not have any desire to harm his brother or uncle.  He reports that he has been struggling lately but that he is not severely depressed or manic.  He reports that his current hyperverbal speech and hyperactive behavior are his baseline and blames "having ADHD" for some of this.  Patient adamantly denies that he has any intent to harm others or any history of harming others.  Unable to get much more information from patient due to  pressured speech, tangentiality, and minimizing.    Patient gave permission to speak with his mother and Gustave Lei (friend) and brother (did not know number) and gave permission to receive information and provide information to these individuals.  Collateral Information, mother, [phone]: She reports that he jumped out of vehicle when it was going roughly 25 miles an hour.  She reports that he made remarks that he would kill his uncle and she reports that she has text message screen shots as evidence.  Reports that he was not at his baseline.  Reports that his brother is taking care of his dog.  Collateral Information, Gustave Lei, friend, [phone]: Reports that she has known him for several years and has talked to him several times this week.  She reports that he is at his baseline.  She reports that patient has periods in which she is depressed but otherwise is very stable person.  She said she spoke with him today and had no concerns.  When asked specifically about patient's hyperverbal speech and tangentiality she reports that this is his baseline and she did not notice anything concerning.  She reports that he is not a violent person.  Collateral Information, Georgiana Kirks, brother, [phone] (obtained from 2022 note): no answer x2.  Past Psychiatric Hx: Current Psychiatrist: Denies Current Therapist: Endorses but does not specify who Previous Psychiatric Diagnoses: MDD Psychiatric Medications: Current None Past Mirtazapine  until 2024  Psychiatric Hospitalization hx: 2018 for suicidal thoughts, 2021/2022 for aggression Psychotherapy hx: Endorses but does not  specify Neuromodulation history: Denies History of suicide: Denies History of homicide or aggression: Per chart review patient has been evaluate Emergency Department numerous times for aggressive behavior but has no documented violence towards others.  Significant history of aggression being directed towards himself and former suicidal  statements after fights.  Substance Use Hx: Alcohol: Reports having a 12 ounce mad dog occasionally.  On chart review patient does have substantial history of alcohol use Tobacco: Reports using cigarettes and black and mild.  Uses about 1 pack every few days Cannabis: Denies but UDS positive Other Substances: Denies Non-prescribed medications: Denies Rehab History: Denies  Past Medical History: PCP: Annamaria Barrette, FNP (infectious disease) Medical Dx: HIV Meds: Biktarvy  Allergies: No known drug allergies Hospitalizations: None Surgeries: Inguinal hernia surgery 2010 TBI: Denies Seizures: Denies  Family History: Unable to assess  Social History: Developmental: Unable to assess Current Living Situation: Lives with his brother Education: Did not discuss Occupation: Works at [employer]: Did not discuss Spirituality/religiosity: Did not discuss Marital Status / Relationships: Never married, currently relationship for a couple months Children: Denies Hotel manager: Denies  Legal: Reports he has upcoming court date with "[address]" but does not know the date Access to firearms: Denies per patient and collateral   Total Time spent with patient: 1 hour  Is the patient at risk to self? Yes.    Has the patient been a risk to self in the past 6 months? No.  Has the patient been a risk to self within the distant past? Yes.    Is the patient a risk to others? No.  Has the patient been a risk to others in the past 6 months? No.  Has the patient been a risk to others within the distant past? No.   [place] Scale:  Flowsheet Row [facility] (Current) [facility] from 07/31/2023 in [facility] from 09/20/2020 in [facility] Emergency Department at [facility]  C-SSRS RISK CATEGORY No Risk No Risk No Risk        Tobacco Screening:  Social History   Tobacco Use   Smoking Status Some Days   Current packs/day: 0.25   Types: Cigarettes  Smokeless Tobacco Never  Tobacco Comments   Smokes a cigarette with breakfast and in the evening    BH Tobacco Counseling     Are you interested in Tobacco Cessation Medications?  No, patient refused Counseled patient on smoking cessation:  Refused/Declined practical counseling Reason Tobacco Screening Not Completed: Patient Refused Screening       Social History:  Social History   Substance and Sexual Activity  Alcohol Use Not Currently   Comment: rarely     Social History   Substance and Sexual Activity  Drug Use Yes   Frequency: 4.0 times per week   Types: Marijuana   Comment: some days     Allergies:  No Known Allergies Lab Results:  Results for orders placed or performed during the hospital encounter of 07/31/23 (from the past 48 hours)  Magnesium      Status: None   Collection Time: 07/31/23  8:13 PM  Result Value Ref Range   Magnesium  2.1 1.7 - 2.4 mg/dL    Comment: Performed at [facility] [address] [place], [place] [address]  Comprehensive metabolic panel     Status: Abnormal   Collection Time: 07/31/23  8:15 PM  Result Value Ref Range  Sodium 140 135 - 145 mmol/L   Potassium 2.8 (L) 3.5 - 5.1 mmol/L   Chloride 105 98 - 111 mmol/L   CO2 22 22 - 32 mmol/L   Glucose, Bld 98 70 - 99 mg/dL    Comment: Glucose reference range applies only to samples taken after fasting for at least 8 hours.   BUN 14 6 - 20 mg/dL   Creatinine, Ser 1.61 0.61 - 1.24 mg/dL   Calcium 9.2 8.9 - 09.6 mg/dL   Total Protein 8.5 (H) 6.5 - 8.1 g/dL   Albumin 4.7 3.5 - 5.0 g/dL   AST 70 (H) 15 - 41 U/L   ALT 24 0 - 44 U/L   Alkaline Phosphatase 105 38 - 126 U/L   Total Bilirubin 1.6 (H) 0.0 - 1.2 mg/dL   GFR, Estimated >04 >54 mL/min    Comment: (NOTE) Calculated using the CKD-EPI Creatinine Equation (2021)    Anion gap 13 5 - 15    Comment: Performed at [facility], [address], [place], [place] [address]  Ethanol     Status: Abnormal   Collection Time: 07/31/23  8:15 PM  Result Value Ref Range   Alcohol, Ethyl (B) 209 (H) <15 mg/dL    Comment: Please note change in reference range. (NOTE) For medical purposes only. Performed at [facility], [address], [place], [place] [address]   Urine rapid drug screen (hosp performed)     Status: Abnormal   Collection Time: 07/31/23  8:15 PM  Result Value Ref Range   Opiates NONE DETECTED NONE DETECTED   Cocaine NONE DETECTED NONE DETECTED   Benzodiazepines NONE DETECTED NONE DETECTED   Amphetamines NONE DETECTED NONE DETECTED   Tetrahydrocannabinol POSITIVE (A) NONE DETECTED   Barbiturates NONE DETECTED NONE DETECTED    Comment: (NOTE) DRUG SCREEN FOR MEDICAL PURPOSES ONLY.  IF CONFIRMATION IS NEEDED FOR ANY PURPOSE, NOTIFY LAB WITHIN 5 DAYS.  LOWEST DETECTABLE LIMITS FOR URINE DRUG SCREEN Drug Class                     Cutoff (ng/mL) Amphetamine and metabolites    1000 Barbiturate and metabolites    200 Benzodiazepine                 200 Opiates and metabolites        300 Cocaine and metabolites        300 THC                            50 Performed at [facility], [address], [place], [place] [address]   CBC with Diff     Status: Abnormal   Collection Time: 07/31/23  8:15 PM  Result Value Ref Range   WBC 10.2 4.0 - 10.5 K/uL   RBC 5.66 4.22 - 5.81 MIL/uL   Hemoglobin 14.9 13.0 - 17.0 g/dL   HCT 13.0 86.5 - 78.4 %   MCV 80.4 80.0 - 100.0 fL   MCH 26.3 26.0 - 34.0 pg   MCHC 32.7 30.0 - 36.0 g/dL   RDW 69.6 (H) 29.5 - 28.4 %   Platelets 213 150 - 400 K/uL   nRBC 0.0 0.0 - 0.2 %   Neutrophils Relative % 75 %   Neutro Abs 7.7 1.7 - 7.7 K/uL   Lymphocytes Relative 16 %   Lymphs Abs 1.6 0.7 - 4.0 K/uL  Monocytes Relative 6 %   Monocytes Absolute 0.6 0.1 - 1.0 K/uL   Eosinophils Relative 2 %   Eosinophils Absolute 0.2 0.0 -  0.5 K/uL   Basophils Relative 0 %   Basophils Absolute 0.0 0.0 - 0.1 K/uL   Immature Granulocytes 1 %   Abs Immature Granulocytes 0.06 0.00 - 0.07 K/uL    Comment: Performed at [facility], [address], [place], [place] [address]  Salicylate level     Status: Abnormal   Collection Time: 07/31/23  8:15 PM  Result Value Ref Range   Salicylate Lvl <7.0 (L) 7.0 - 30.0 mg/dL    Comment: Performed at [facility], [address] [place], [place] [address]  Acetaminophen  level     Status: Abnormal   Collection Time: 07/31/23  8:15 PM  Result Value Ref Range   Acetaminophen  (Tylenol ), Serum <10 (L) 10 - 30 ug/mL    Comment: (NOTE) Therapeutic concentrations vary significantly. A range of 10-30 ug/mL  may be an effective concentration for many patients. However, some  are best treated at concentrations outside of this range. Acetaminophen  concentrations >150 ug/mL at 4 hours after ingestion  and >50 ug/mL at 12 hours after ingestion are often associated with  toxic reactions.  Performed at [facility], [address], [place], [place] [address]   Comprehensive metabolic panel with GFR     Status: Abnormal   Collection Time: 08/01/23  9:20 AM  Result Value Ref Range   Sodium 139 135 - 145 mmol/L   Potassium 3.9 3.5 - 5.1 mmol/L   Chloride 102 98 - 111 mmol/L   CO2 22 22 - 32 mmol/L   Glucose, Bld 78 70 - 99 mg/dL    Comment: Glucose reference range applies only to samples taken after fasting for at least 8 hours.   BUN 17 6 - 20 mg/dL   Creatinine, Ser 9.14 0.61 - 1.24 mg/dL   Calcium 9.6 8.9 - 78.2 mg/dL   Total Protein 8.3 (H) 6.5 - 8.1 g/dL   Albumin 4.5 3.5 - 5.0 g/dL   AST 64 (H) 15 - 41 U/L   ALT 24 0 - 44 U/L   Alkaline Phosphatase 104 38 - 126 U/L   Total Bilirubin 2.4 (H) 0.0 - 1.2 mg/dL   GFR, Estimated >95 >62 mL/min    Comment: (NOTE) Calculated using the CKD-EPI Creatinine Equation (2021)    Anion gap 15  5 - 15    Comment: Performed at [facility], [address] [place], [place] [address]    Blood Alcohol level:  Lab Results  Component Value Date   ETH 209 (H) 07/31/2023   ETH <10 10/06/2019    Metabolic Disorder Labs:  No results found for: "HGBA1C", "MPG" No results found for: "PROLACTIN" Lab Results  Component Value Date   CHOL 144 09/23/2020   TRIG 172 (H) 09/23/2020   HDL 23 (L) 09/23/2020   CHOLHDL 6.3 (H) 09/23/2020   LDLCALC 94 09/23/2020    Current Medications: Current Facility-Administered Medications  Medication Dose Route Frequency Provider Last Rate Last Admin   acetaminophen  (TYLENOL ) tablet 650 mg  650 mg Oral Q6H PRN Motley-Mangrum, Jadeka A, PMHNP       alum & mag hydroxide-simeth (MAALOX/MYLANTA) 200-200-20 MG/5ML suspension 30 mL  30 mL Oral Q4H PRN Motley-Mangrum, Jadeka A, PMHNP       bictegravir-emtricitabine -tenofovir  AF (BIKTARVY ) 50-200-25 MG per tablet 1 tablet  1 tablet Oral  Daily Motley-Mangrum, Jadeka A, PMHNP   1 tablet at 08/02/23 1059   haloperidol (HALDOL) tablet 5 mg  5 mg Oral TID PRN Motley-Mangrum, Jadeka A, PMHNP       And   diphenhydrAMINE (BENADRYL) capsule 50 mg  50 mg Oral TID PRN Motley-Mangrum, Jadeka A, PMHNP       haloperidol lactate (HALDOL) injection 5 mg  5 mg Intramuscular TID PRN Motley-Mangrum, Jadeka A, PMHNP       And   diphenhydrAMINE (BENADRYL) injection 50 mg  50 mg Intramuscular TID PRN Motley-Mangrum, Jadeka A, PMHNP       And   LORazepam  (ATIVAN ) injection 2 mg  2 mg Intramuscular TID PRN Motley-Mangrum, Jadeka A, PMHNP       haloperidol lactate (HALDOL) injection 10 mg  10 mg Intramuscular TID PRN Motley-Mangrum, Jadeka A, PMHNP       And   diphenhydrAMINE (BENADRYL) injection 50 mg  50 mg Intramuscular TID PRN Motley-Mangrum, Jadeka A, PMHNP       And   LORazepam  (ATIVAN ) injection 2 mg  2 mg Intramuscular TID PRN Motley-Mangrum, Jadeka A, PMHNP       [START ON 08/03/2023] divalproex (DEPAKOTE  ER) 24 hr tablet 750 mg  750 mg Oral Daily Deseri Loss, MD       hydrOXYzine (ATARAX) tablet 25 mg  25 mg Oral Q6H PRN Motley-Mangrum, Jadeka A, PMHNP       loperamide (IMODIUM) capsule 2-4 mg  2-4 mg Oral PRN Motley-Mangrum, Jadeka A, PMHNP       LORazepam  (ATIVAN ) tablet 1 mg  1 mg Oral Q6H PRN Motley-Mangrum, Jadeka A, PMHNP       magnesium  hydroxide (MILK OF MAGNESIA) suspension 30 mL  30 mL Oral Daily PRN Motley-Mangrum, Jadeka A, PMHNP       multivitamin with minerals tablet 1 tablet  1 tablet Oral Daily Motley-Mangrum, Jadeka A, PMHNP       nicotine (NICODERM CQ - dosed in mg/24 hr) patch 7 mg  7 mg Transdermal Daily PRN Jaylenne Hamelin, MD       nicotine polacrilex (NICORETTE) gum 2 mg  2 mg Oral PRN Shericka Johnstone, MD       ondansetron  (ZOFRAN -ODT) disintegrating tablet 4 mg  4 mg Oral Q6H PRN Motley-Mangrum, Jadeka A, PMHNP       risperiDONE (RISPERDAL M-TABS) disintegrating tablet 2 mg  2 mg Oral BID Hema Lanza, MD       thiamine (Vitamin B-1) tablet 100 mg  100 mg Oral Daily Motley-Mangrum, Jadeka A, PMHNP       traZODone (DESYREL) tablet 50 mg  50 mg Oral QHS PRN Motley-Mangrum, Jadeka A, PMHNP       PTA Medications: Medications Prior to Admission  Medication Sig Dispense Refill Last Dose/Taking   bictegravir-emtricitabine -tenofovir  AF (BIKTARVY ) 50-200-25 MG TABS tablet Take 1 tablet by mouth daily. NO FURTHER REFILLS UNTIL SEEN IN OFFICE 30 tablet 2     Musculoskeletal: Strength & Muscle Tone: within normal limits Gait & Station: normal Patient leans: N/A  Psychiatric Specialty Exam:  Presentation  General Appearance: Appropriate for Environment (Paper gown)  Eye Contact:Fair  Speech:Pressured  Speech Volume:Increased  Handedness:No data recorded  Mood and Affect  Mood:Euthymic  Affect:Labile (Mildly irritable but mostly elevated mood.)   Thought Process  Thought Processes:Coherent; Irrevelant  Descriptions of  Associations:Tangential  Orientation:Full (Time, Place and Person)  Thought Content:Logical  History of Schizophrenia/Schizoaffective disorder:No data recorded Duration of Psychotic Symptoms:N/A Hallucinations:Hallucinations: None  Ideas of Reference:None  Suicidal Thoughts:Suicidal Thoughts: No  Homicidal Thoughts:Homicidal Thoughts: No   Sensorium  Memory:Immediate Good; Recent Good; Remote Good  Judgment:Poor  Insight:Poor   Executive Functions  Concentration:Good  Attention Span:Good  Recall:Good  Fund of Knowledge:Good  Language:Good   Psychomotor Activity  Psychomotor Activity:Psychomotor Activity: Increased (Moving around a lot during interview.  Unrelated to medication initiation per patient.)   Assets  Assets:Desire for Improvement; Housing; Social Support   Sleep  Sleep:Sleep: Fair    Physical Exam: Physical Exam Vitals and nursing note reviewed.  HENT:     Head: Normocephalic and atraumatic.  Pulmonary:     Effort: Pulmonary effort is normal.  Neurological:     General: No focal deficit present.     Mental Status: He is alert.  Psychiatric:     Comments: No obvious EPS.    Review of Systems  Constitutional:  Negative for fever.  Cardiovascular:  Negative for chest pain and palpitations.  Gastrointestinal:  Negative for constipation, diarrhea, nausea and vomiting.  Neurological:  Negative for dizziness, weakness and headaches.  Psychiatric/Behavioral:         Pt denies extrapyramidal symptoms including dystonia (sudden spastic contractions of muscle groups), parkinsonism (bradykinesia, tremors, rigidity), and akathisia (severe restlessness).    Blood pressure (!) 125/90, pulse 73, temperature 97.8 F (36.6 C), temperature source Oral, resp. rate 16, height 5\' 11"  (1.803 m), weight 71.2 kg, SpO2 100%. Body mass index is 21.9 kg/m.   Treatment Plan Summary: Daily contact with patient to assess and evaluate symptoms and progress in  treatment and Medication management   ASSESSMENT & PLAN  ASSESSMENT:   Diagnoses / Active Problems:  Principal Problem:   Bipolar 1 disorder (HCC), current episode manic  Jerry Johnson is a 33 year old male with past history of MDD, hospitalization in 2018 for suicidal thoughts, and multiple ED presentations for aggression (but not violence) who was admitted to [facility] after being IVC by mother for SI, HI, and changes from baseline behavior.  On interview patient is exhibiting symptoms of mania including pressured speech, tangentiality, elevated/irritable mood, increased activity levels, and recent excessive spending.  Plan to continue current regimen but with increased dose of Risperdal and Depakote.  Discontinuing mirtazapine  as patient had not been on this medication for over a year and was likely inadvertently restarted as a "home med."  Collateral from friend was inconsistent with report for mother and unable to get in contact with brother as third contact.  Switching Depakote to liquid as patient has already refused previous dose.  PLAN: Safety and Monitoring:             -- Involuntary admission to inpatient psychiatric unit for safety, stabilization and treatment             -- Daily contact with patient to assess and evaluate symptoms and progress in treatment             -- Patient's case to be discussed in multi-disciplinary team meeting             -- Observation Level : q15 minute checks             -- Vital signs:  q12 hours             -- Precautions: suicide, elopement, and assault   2. Psychiatric Diagnoses and Treatment:  -- Increase Risperdal to 2 mg twice daily for mood stabilization --Increase Depakote 500 mg 3 times daily for mood stabilization  -- Continue trazodone 50 mg once nightly  as needed for insomnia  -- Continue hydroxyzine 25 mg 3 times daily as needed for anxiety  --  The risks/benefits/side-effects/alternatives to this medication were discussed in detail with the  patient and time was given for questions. The patient consents to medication trial.              -- Metabolic profile and EKG monitoring obtained while on an atypical antipsychotic. See #4 below for values.              -- Encouraged patient to participate in unit milieu and in scheduled group therapies              -- Short Term Goals: Ability to identify changes in lifestyle to reduce recurrence of condition will improve, Ability to verbalize feelings will improve, Ability to disclose and discuss suicidal ideas, Ability to demonstrate self-control will improve, Ability to identify and develop effective coping behaviors will improve, Ability to maintain clinical measurements within normal limits will improve, Compliance with prescribed medications will improve, and Ability to identify triggers associated with substance abuse/mental health issues will improve             -- Long Term Goals: Improvement in symptoms so as ready for discharge                3. Medical Issues Being Addressed:              --HIV: Restarted Biktarvy     -- Continue PRN's: Tylenol , Maalox, Milk of Magnesia   4. Routine and other pertinent labs reviewed: EKG monitoring: QTc: 454  Metabolism / endocrine: BMI: Body mass index is 21.9 kg/m. Prolactin: No results found for: "PROLACTIN" Lipid Panel: Lab Results  Component Value Date   CHOL 144 09/23/2020   TRIG 172 (H) 09/23/2020   HDL 23 (L) 09/23/2020   CHOLHDL 6.3 (H) 09/23/2020   LDLCALC 94 09/23/2020   HbgA1c: No results found for: "HGBA1C" TSH: No results found for: "TSH"  Labs to order: Folate, B12, hemoglobin A1c, lipid panel, TSH, vitamin D  5. Discharge Planning:              -- Social work and case management to assist with discharge planning and identification of hospital follow-up needs prior to discharge             -- Estimated LOS: 7-10 days              -- Discharge Concerns: Need to establish a safety plan; Medication compliance and  effectiveness             -- Discharge Goals: Return home with outpatient referrals for mental health follow-up including medication management/psychotherapy    I certify that inpatient services furnished can reasonably be expected to improve the patient's condition.   This note was created using a voice recognition software as a result there may be grammatical errors inadvertently enclosed that do not reflect the nature of this encounter. Every attempt is made to correct such errors.   Verdell Given, MD PGY-1 Psychiatry Resident 08/02/2023, 3:29 PM

## 2023-08-02 NOTE — Progress Notes (Signed)
 Patient was admitted earlier today. He attended group and had snack with other patients. Pt stayed in dayroom until ready to go to bed. Cooperative with talk and meds. Pt states his mom IVCed him to this facility. Pt speaks even toned and fast. Stated his goal is to get out of here as fast as he can. He has a girlfriend and states he had a job interview this am. He states he begged his mother to let him get his affairs in order regarding his job, girlfriend, dog and friends, but states she did not and he is a little upset because of that. States he has been in and out of psychiatric hospitals since he was 33yo. Because of her. Pt denies any anxiety, depression and AVH. Pt contracted verbally for safety. 15 min. Checks maintained. Pt also admitted he does not sleep, and when he does it is 2-3 hr naps. States he is a very intelligent man and he is always the one asking his friends how their mental health is doing and trys to help them. States he helps his family members with moves and whatever else they may need.

## 2023-08-03 ENCOUNTER — Other Ambulatory Visit: Payer: Self-pay

## 2023-08-03 ENCOUNTER — Encounter (HOSPITAL_COMMUNITY): Payer: Self-pay | Admitting: Psychiatry

## 2023-08-03 DIAGNOSIS — B20 Human immunodeficiency virus [HIV] disease: Secondary | ICD-10-CM

## 2023-08-03 DIAGNOSIS — F319 Bipolar disorder, unspecified: Secondary | ICD-10-CM | POA: Diagnosis not present

## 2023-08-03 DIAGNOSIS — Z0000 Encounter for general adult medical examination without abnormal findings: Secondary | ICD-10-CM

## 2023-08-03 DIAGNOSIS — F332 Major depressive disorder, recurrent severe without psychotic features: Secondary | ICD-10-CM

## 2023-08-03 DIAGNOSIS — Z7689 Persons encountering health services in other specified circumstances: Secondary | ICD-10-CM

## 2023-08-03 LAB — TSH: TSH: 1.73 u[IU]/mL (ref 0.350–4.500)

## 2023-08-03 LAB — VITAMIN B12: Vitamin B-12: 140 pg/mL — ABNORMAL LOW (ref 180–914)

## 2023-08-03 LAB — LIPID PANEL
Cholesterol: 163 mg/dL (ref 0–200)
HDL: 61 mg/dL (ref >40)
LDL Cholesterol: 88 mg/dL (ref 0–99)
Total CHOL/HDL Ratio: 2.7 ratio
Triglycerides: 70 mg/dL (ref <150)
VLDL: 14 mg/dL (ref 0–40)

## 2023-08-03 LAB — FOLATE: Folate: 6.1 ng/mL (ref >5.9)

## 2023-08-03 LAB — VITAMIN D 25 HYDROXY (VIT D DEFICIENCY, FRACTURES): Vit D, 25-Hydroxy: 14.98 ng/mL — ABNORMAL LOW (ref 30–100)

## 2023-08-03 MED ORDER — TRAZODONE HCL 50 MG PO TABS
50.0000 mg | ORAL_TABLET | Freq: Every day | ORAL | Status: DC
  Administered 2023-08-03 – 2023-08-07 (×5): 50 mg via ORAL
  Filled 2023-08-03 (×8): qty 1

## 2023-08-03 NOTE — Group Note (Signed)
 Date:  08/03/2023 Time:  9:04 PM  Group Topic/Focus:  Wrap-Up Group:   The focus of this group is to help patients review their daily goal of treatment and discuss progress on daily workbooks.    Participation Level:  Active  Participation Quality:  Appropriate and Attentive  Affect:  Appropriate  Cognitive:  Alert and Appropriate  Insight: Appropriate and Good  Engagement in Group:  Engaged  Modes of Intervention:  Discussion and Education  Additional Comments:  Pt attended and participated in wrap up group this evening and rated their day a 10/10. Pt stated that they spoke with their 33yr old goddaughter as well as friends and family members. Pt goal is to be D/C soon, possibly tomorrow if able. Pt has no complaints to report at this time.   Eligah Grow 08/03/2023, 9:04 PM

## 2023-08-03 NOTE — Progress Notes (Signed)
   08/03/23 2030  Psych Admission Type (Psych Patients Only)  Admission Status Involuntary  Psychosocial Assessment  Patient Complaints Anxiety  Eye Contact Fair  Facial Expression Anxious  Affect Anxious  Speech Logical/coherent  Interaction Assertive  Motor Activity Restless  Appearance/Hygiene Unremarkable  Behavior Characteristics Cooperative  Mood Anxious;Pleasant  Aggressive Behavior  Effect No apparent injury  Thought Process  Coherency Circumstantial  Content Blaming others  Delusions None reported or observed  Perception WDL  Hallucination None reported or observed  Judgment Poor  Confusion WDL  Danger to Self  Current suicidal ideation? Denies  Danger to Others  Danger to Others None reported or observed

## 2023-08-03 NOTE — Progress Notes (Signed)
   08/03/23 0800  Psych Admission Type (Psych Patients Only)  Admission Status Involuntary  Psychosocial Assessment  Patient Complaints Agitation;Anxiety  Eye Contact Fair  Facial Expression Anxious;Animated  Affect Anxious  Speech Logical/coherent  Interaction Assertive  Motor Activity Restless  Appearance/Hygiene Unremarkable  Behavior Characteristics Appropriate to situation;Cooperative  Mood Anxious  Thought Process  Coherency Circumstantial  Content Blaming others  Delusions None reported or observed  Perception WDL  Hallucination None reported or observed  Judgment Poor  Confusion None  Danger to Self  Current suicidal ideation? Denies  Agreement Not to Harm Self Yes  Description of Agreement verbal  Danger to Others  Danger to Others None reported or observed

## 2023-08-03 NOTE — Plan of Care (Signed)
   Problem: Activity: Goal: Interest or engagement in activities will improve Outcome: Progressing   Problem: Coping: Goal: Ability to verbalize frustrations and anger appropriately will improve Outcome: Progressing   Problem: Safety: Goal: Periods of time without injury will increase Outcome: Progressing

## 2023-08-03 NOTE — Group Note (Signed)
 Date:  08/03/2023 Time:  9:03 AM  Group Topic/Focus:  Goals Group:   The focus of this group is to help patients establish daily goals to achieve during treatment and discuss how the patient can incorporate goal setting into their daily lives to aide in recovery.    Participation Level:  Active  Participation Quality:  Appropriate  Affect:  Appropriate  Jerry Johnson 08/03/2023, 9:03 AM

## 2023-08-03 NOTE — Group Note (Addendum)
 LCSW Group Therapy Note   Group Date: 08/03/2023 Start Time: 1100 End Time: 1200  Participation:  patient was present and actively participated in the discussion.  He shared personal experiences and wanted to learn.    Type of Therapy:  Group Therapy  Topic:  "Finding Balance: Using Wise Mind for Thoughtful Decisions"  Objective:  the objective of this class is to help participants understand the concept of Wise Mind and learn how to apply it to real-life situations to make balanced, thoughtful decisions. Participants will gain tools to manage emotions, consider logic, and find a middle ground that leads to healthier responses and outcomes.  Goals: Understand the concept of Wise Mind.  Participants will learn the difference between Emotional Mind, Reasonable Mind, and Agustina Aldrich Mind, and how Agustina Aldrich Mind helps in balancing emotions and logic to make thoughtful decisions. Recognize when you're in Emotional Mind or Reasonable Mind.  Participants will identify the signs of Emotional Mind and Reasonable Mind in their own reactions to situations and understand how to move into Wise Mind for more balanced responses. Practice applying Agustina Aldrich Mind to real-life situations.  Through scenarios and group activities, participants will practice using Wise Mind in everyday situations, learning how to acknowledge their emotions, think logically, and create solutions that are thoughtful and balanced.  Summary: In this class, we explored the concept of Wise Mind--the balance between Emotional Mind and Reasonable Mind. We discussed how Emotional Mind can sometimes lead to impulsive, reactive decisions driven by intense feelings, and how Reasonable Mind might ignore feelings altogether, focusing only on facts and logic. Agustina Aldrich Mind is the middle ground that combines both, allowing you to consider your emotions and use logic to make balanced, thoughtful decisions.  We learned how to recognize when we're in Emotional Mind or  Reasonable Mind, and practiced using Wise Mind in real-life situations. By using [place]ola [place], we can improve how we handle challenging situations, make better decisions, and strengthen our relationships with others.  Therapeutic Modalities: Elements of DBT - emotional regulation   Zamira Hickam O Trichelle Lehan, LCSWA 08/03/2023  1:23 PM

## 2023-08-03 NOTE — [facility] Suicide Risk Assessment (Signed)
 [facility] INPATIENT:  Family/Significant Other Suicide Prevention Education  Suicide Prevention Education:  Education Completed; mom Meritt Cerbone, [phone] ,  (name of family member/significant other) has been identified by the patient as the family member/significant other with whom the patient will be residing, and identified as the person(s) who will aid the patient in the event of a mental health crisis (suicidal ideations/suicide attempt).  With written consent from the patient, the family member/significant other has been provided the following suicide prevention education, prior to the and/or following the discharge of the patient.  Does not have access to weapons or firearms, will need transportation home at discharge.  The suicide prevention education provided includes the following: Suicide risk factors Suicide prevention and interventions National Suicide Hotline telephone number [facility] assessment telephone number [facility] Emergency Assistance 911 [facility] and/or Residential Mobile Crisis Unit telephone number  Request made of family/significant other to: Remove weapons (e.g., guns, rifles, knives), all items previously/currently identified as safety concern.   Remove drugs/medications (over-the-counter, prescriptions, illicit drugs), all items previously/currently identified as a safety concern.  The family member/significant other verbalizes understanding of the suicide prevention education information provided.  The family member/significant other agrees to remove the items of safety concern listed above.  Vonzell Guerin 08/03/2023, 12:39 PM

## 2023-08-03 NOTE — Plan of Care (Signed)
   Problem: Education: Goal: Knowledge of [facility] Education information/materials will improve Outcome: Progressing Goal: Emotional status will improve Outcome: Progressing Goal: Mental status will improve Outcome: Progressing

## 2023-08-03 NOTE — Plan of Care (Signed)
   Problem: Education: Goal: Emotional status will improve Outcome: Progressing Goal: Mental status will improve Outcome: Progressing   Problem: Activity: Goal: Interest or engagement in activities will improve Outcome: Progressing Goal: Sleeping patterns will improve Outcome: Progressing

## 2023-08-03 NOTE — [facility] Group Notes (Signed)
 Spiritual care group on grief and loss facilitated by Chaplain Nick Barman, [facility]  Group Goal: Support / Education around grief and loss  Members engage in facilitated group support and psycho-social education.  Group Description:  Following introductions and group rules, group members engaged in facilitated group dialogue and support around topic of loss, with particular support around experiences of loss in their lives. Group Identified types of loss (relationships / self / things) and identified patterns, circumstances, and changes that precipitate losses. Reflected on thoughts / feelings around loss, normalized grief responses, and recognized variety in grief experience. Group encouraged individual reflection on safe space and on the coping skills that they are already utilizing.  Group drew on Adlerian / Rogerian and narrative framework  Patient Progress: Jerry Johnson attended group and actively engaged and participated in group conversation and activities.

## 2023-08-03 NOTE — Progress Notes (Signed)
 [facility] MD Progress Note  08/03/2023 1:27 PM Jerry Johnson  MRN:  [id number] Principal Problem: Bipolar 1 disorder (HCC) Diagnosis: Principal Problem:   Bipolar 1 disorder (HCC), current episode manic Active Problems:   HIV disease (HCC)   ID & Admission Information: Jerry Johnson is an 33 y.o. male who  has a past medical history of Acute pharyngitis (06/07/2013), Adjustment disorder with mixed anxiety and depressed mood (10/30/2016), Allergic rhinitis (01/11/2008), ATTENTION DEFICIT, W/HYPERACTIVITY (06/03/2006), Bipolar 1 disorder (HCC), Generalized anxiety disorder (08/01/2023), Otitis externa of both ears (01/10/2014), Rash and nonspecific skin eruption (01/10/2014), Right groin pain (05/27/2012), Suicidal ideation (10/30/2016), and UNSPECIFIED HYDROCELE (03/12/2009).  He presented on 08/01/2023  2:42 PM for Bipolar 1 disorder (HCC).  He presented under petition for involuntary commitment taking out by his mother for jumping out of a moving vehicle while manic.  Chief Complaint: "My mom is mean."  Subjective:   Case was discussed in the multidisciplinary team. MAR was reviewed and patient was compliant with medications.  No acute events occurred overnight.  PRN's in last 24 hours: Trazodone 50 mg administered at 2102  The patient was seen in his room during rounds.  He continues to present as manic and hyperverbal.  He is also somewhat demanding and needy.  He perseverates on his mom involuntarily committing him without good reason.  He has very poor insight into his manic state.  He does seem less grandiose today than yesterday.  He reports that he has tolerated the increase in medications without issue.  He is irritable when talking about his mother, but otherwise pleasant.  He is hyperverbal, but interruptible and redirectable.  Will plan on scheduling trazodone tonight.  We discussed vitamin D deficiency and vitamin B12 deficiency in the treatment plan for which she is in  agreement.   Past Psychiatric and Medical Medical History:  Past Medical History:  Diagnosis Date   Acute pharyngitis 06/07/2013   Adjustment disorder with mixed anxiety and depressed mood 10/30/2016   Allergic rhinitis 01/11/2008   Replacing diagnoses that were inactivated after the 07/06/22 regulatory import     ATTENTION DEFICIT, W/HYPERACTIVITY 06/03/2006   Qualifier: Diagnosis of   By: Alen Amy      Replacing diagnoses that were inactivated after the 07/06/22 regulatory import     Bipolar 1 disorder (HCC)    Generalized anxiety disorder 08/01/2023   Otitis externa of both ears 01/10/2014   Rash and nonspecific skin eruption 01/10/2014   Right groin pain 05/27/2012   Suicidal ideation 10/30/2016   UNSPECIFIED HYDROCELE 03/12/2009   Qualifier: Diagnosis of  By: Jerone Moorman  MD, Sarina Curb      Past Surgical History:  Procedure Laterality Date   INGUINAL HERNIA REPAIR  03/21/09   Repair of R inguinal hernia with hydrocele    Family History(Medical and Psychiatric):  Family History  Problem Relation Age of Onset   Healthy Mother    Healthy Father    See H&P    Social History:  Social History   Substance and Sexual Activity  Alcohol Use Not Currently   Comment: rarely     Social History   Substance and Sexual Activity  Drug Use Yes   Frequency: 4.0 times per week   Types: Marijuana   Comment: some days    Social History   Socioeconomic History   Marital status: Single    Spouse name: Not on file   Number of children: Not on file   Years of education: Not on  file   Highest education level: Not on file  Occupational History   Not on file  Tobacco Use   Smoking status: Some Days    Current packs/day: 0.25    Types: Cigarettes   Smokeless tobacco: Never   Tobacco comments:    Smokes a cigarette with breakfast and in the evening  Vaping Use   Vaping status: Never Used  Substance and Sexual Activity   Alcohol use: Not Currently    Comment: rarely    Drug use: Yes    Frequency: 4.0 times per week    Types: Marijuana    Comment: some days   Sexual activity: Yes    Birth control/protection: Condom    Comment: given condoms  Other Topics Concern   Not on file  Social History Narrative   Not on file   Social Drivers of Health   Financial Resource Strain: Not on file  Food Insecurity: Unknown (08/01/2023)   Hunger Vital Sign    Worried About Running Out of Food in the Last Year: Patient declined    Ran Out of Food in the Last Year: Not on file  Transportation Needs: Patient Declined (08/01/2023)   PRAPARE - Administrator, Civil Service (Medical): Patient declined    Lack of Transportation (Non-Medical): Patient declined  Physical Activity: Not on file  Stress: Not on file  Social Connections: Not on file        Current Medications: Current Facility-Administered Medications  Medication Dose Route Frequency Provider Last Rate Last Admin   acetaminophen  (TYLENOL ) tablet 650 mg  650 mg Oral Q6H PRN Motley-Mangrum, Jadeka A, PMHNP       alum & mag hydroxide-simeth (MAALOX/MYLANTA) 200-200-20 MG/5ML suspension 30 mL  30 mL Oral Q4H PRN Motley-Mangrum, Jadeka A, PMHNP       bictegravir-emtricitabine -tenofovir  AF (BIKTARVY ) 50-200-25 MG per tablet 1 tablet  1 tablet Oral Daily Motley-Mangrum, Jadeka A, PMHNP   1 tablet at 08/03/23 0758   haloperidol (HALDOL) tablet 5 mg  5 mg Oral TID PRN Motley-Mangrum, Jadeka A, PMHNP       And   diphenhydrAMINE (BENADRYL) capsule 50 mg  50 mg Oral TID PRN Motley-Mangrum, Jadeka A, PMHNP       haloperidol lactate (HALDOL) injection 5 mg  5 mg Intramuscular TID PRN Motley-Mangrum, Jadeka A, PMHNP       And   diphenhydrAMINE (BENADRYL) injection 50 mg  50 mg Intramuscular TID PRN Motley-Mangrum, Jadeka A, PMHNP       And   LORazepam  (ATIVAN ) injection 2 mg  2 mg Intramuscular TID PRN Motley-Mangrum, Jadeka A, PMHNP       haloperidol lactate (HALDOL) injection 10 mg  10 mg Intramuscular  TID PRN Motley-Mangrum, Jadeka A, PMHNP       And   diphenhydrAMINE (BENADRYL) injection 50 mg  50 mg Intramuscular TID PRN Motley-Mangrum, Jadeka A, PMHNP       And   LORazepam  (ATIVAN ) injection 2 mg  2 mg Intramuscular TID PRN Motley-Mangrum, Jadeka A, PMHNP       hydrOXYzine (ATARAX) tablet 25 mg  25 mg Oral Q6H PRN Motley-Mangrum, Jadeka A, PMHNP   25 mg at 08/02/23 2102   loperamide (IMODIUM) capsule 2-4 mg  2-4 mg Oral PRN Motley-Mangrum, Jadeka A, PMHNP       LORazepam  (ATIVAN ) tablet 1 mg  1 mg Oral Q6H PRN Motley-Mangrum, Jadeka A, PMHNP       magnesium  hydroxide (MILK OF MAGNESIA) suspension 30 mL  30 mL Oral Daily PRN Motley-Mangrum, Jadeka A, PMHNP       multivitamin with minerals tablet 1 tablet  1 tablet Oral Daily Motley-Mangrum, Jadeka A, PMHNP   1 tablet at 08/03/23 0756   nicotine (NICODERM CQ - dosed in mg/24 hr) patch 7 mg  7 mg Transdermal Daily PRN McCarty, Artie, MD       nicotine polacrilex (NICORETTE) gum 2 mg  2 mg Oral PRN McCarty, Artie, MD       ondansetron  (ZOFRAN -ODT) disintegrating tablet 4 mg  4 mg Oral Q6H PRN Motley-Mangrum, Jadeka A, PMHNP       risperiDONE (RISPERDAL M-TABS) disintegrating tablet 2 mg  2 mg Oral BID McCarty, Artie, MD   2 mg at 08/03/23 0756   thiamine (Vitamin B-1) tablet 100 mg  100 mg Oral Daily Motley-Mangrum, Jadeka A, PMHNP   100 mg at 08/03/23 0757   traZODone (DESYREL) tablet 50 mg  50 mg Oral QHS Chelsei Mcchesney S, MD       valproic acid  (DEPAKENE ) 250 MG/5ML solution 500 mg  500 mg Oral TID McCarty, Artie, MD   500 mg at 08/03/23 1610    Lab Results:  Results for orders placed or performed during the hospital encounter of 08/01/23 (from the past 48 hours)  VITAMIN D 25 Hydroxy (Vit-D Deficiency, Fractures)     Status: Abnormal   Collection Time: 08/03/23  6:17 AM  Result Value Ref Range   Vit D, 25-Hydroxy 14.98 (L) 30 - 100 ng/mL    Comment: (NOTE) Vitamin D deficiency has been defined by the Institute of Medicine  and an  Endocrine Society practice guideline as a level of serum 25-OH  vitamin D less than 20 ng/mL (1,2). The Endocrine Society went on to  further define vitamin D insufficiency as a level between 21 and 29  ng/mL (2).  1. IOM (Institute of Medicine). 2010. Dietary reference intakes for  calcium and D. [place]: [employer]. 2. Holick MF, Binkley [place]ope, Bischoff-Ferrari HA, et al. Evaluation,  treatment, and prevention of vitamin D deficiency: an Endocrine  Society clinical practice guideline, JCEM. 2011 Jul; 96(7): 1911-30.  Performed at [facility], [address], [place], [place] [address]   Folate     Status: None   Collection Time: 08/03/23  6:17 AM  Result Value Ref Range   Folate 6.1 >5.9 ng/mL    Comment: Performed at [facility], [address] [place], [place] [address]  Vitamin B12     Status: Abnormal   Collection Time: 08/03/23  6:17 AM  Result Value Ref Range   Vitamin B-12 140 (L) 180 - 914 pg/mL    Comment: (NOTE) This assay is not validated for testing neonatal or myeloproliferative syndrome specimens for Vitamin B12 levels. Performed at [facility], [address] [place], [place] [address]   TSH     Status: None   Collection Time: 08/03/23  6:17 AM  Result Value Ref Range   TSH 1.730 0.350 - 4.500 uIU/mL    Comment: Performed by a 3rd Generation assay with a functional sensitivity of <=0.01 uIU/mL. Performed at [facility], [address], [place], [place] [address]   Lipid panel     Status: None   Collection Time: 08/03/23  6:17 AM  Result Value Ref Range   Cholesterol 163 0 - 200 mg/dL   Triglycerides 70 <213 mg/dL   HDL 61 >08 mg/dL  Total CHOL/HDL Ratio 2.7 RATIO   VLDL 14 0 - 40 mg/dL   LDL Cholesterol 88 0 - 99 mg/dL    Comment:        Total Cholesterol/HDL:CHD Risk Coronary Heart Disease Risk Table                     Men   Women  1/2 Average Risk   3.4    3.3  Average Risk       5.0   4.4  2 X Average Risk   9.6   7.1  3 X Average Risk  23.4   11.0        Use the calculated Patient Ratio above and the CHD Risk Table to determine the patient's CHD Risk.        ATP III CLASSIFICATION (LDL):  <100     mg/dL   Optimal  161-096  mg/dL   Near or Above                    Optimal  130-159  mg/dL   Borderline  045-409  mg/dL   High  >811     mg/dL   Very High Performed at [facility], [address] [place], [place] [address]     Blood Alcohol level:  Lab Results  Component Value Date   ETH 209 (H) 07/31/2023   ETH <10 10/06/2019    Metabolic Disorder Labs: No results found for: "HGBA1C", "MPG" No results found for: "PROLACTIN" Lab Results  Component Value Date   CHOL 163 08/03/2023   TRIG 70 08/03/2023   HDL 61 08/03/2023   CHOLHDL 2.7 08/03/2023   VLDL 14 08/03/2023   LDLCALC 88 08/03/2023   LDLCALC 94 09/23/2020    Physical Findings: AIMS:  , ,  ,  ,    CIWA:  CIWA-Ar Total: 0 COWS:     Psychiatric Specialty Exam:  Presentation  General Appearance: Bizarre  Eye Contact: Fair  Speech: Pressured (Hyperverbal)  Speech Volume: Increased  Handedness: Right   Mood and Affect  Mood: Anxious; Irritable; Euphoric  Affect: Inappropriate   Thought Process  Thought Processes: Linear  Descriptions of Associations: Intact  Orientation: Full (Time, Place and Person)  Thought Content: Perseveration  History of Schizophrenia/Schizoaffective disorder: No data recorded Duration of Psychotic Symptoms: NA Hallucinations: Hallucinations: None  Ideas of Reference: None  Suicidal Thoughts: Suicidal Thoughts: No  Homicidal Thoughts: Homicidal Thoughts: No   Sensorium  Memory: Immediate Good; Recent Good  Judgment: Poor  Insight: Poor   Executive Functions  Concentration: Good  Attention Span: Good  Recall: Good  Fund of Knowledge: Good  Language: Good   Psychomotor  Activity  Psychomotor Activity: Psychomotor Activity: Normal   Assets  Assets: Communication Skills; Social Support; Housing   Sleep  Sleep: Sleep: Fair   Musculoskeletal: Strength & Muscle Tone: within normal limits Gait & Station: normal Patient leans: N/A   Physical Exam: General: Sitting comfortably. NAD. HEENT: Normocephalic, atraumatic, MMM, EMOI Lungs: no increased work of breathing noted Heart: no cyanosis Abdomen: Non distended Musculoskeletal: FROM. No obvious deformities Skin: Warm, dry, intact. No rashes noted Neuro: No obvious focal deficits.  Gait and station are normal  Review of Systems  Constitutional: Negative.   HENT: Negative.    Eyes: Negative.   Respiratory: Negative.    Cardiovascular: Negative.   Gastrointestinal: Negative.   Genitourinary: Negative.   Skin: Negative.   Neurological: Negative.   Psychiatric/Behavioral:  Positive for mania.     Blood pressure (!) 128/106, pulse 75, temperature 98.6 F (37 C), temperature source Oral, resp. rate 16, height 5\' 11"  (1.803 m), weight 71.2 kg, SpO2 100%. Body mass index is 21.9 kg/m.  ASSESSMENT: Jerry Johnson is an 34 y.o. male who  has a past medical history of Acute pharyngitis (06/07/2013), Adjustment disorder with mixed anxiety and depressed mood (10/30/2016), Allergic rhinitis (01/11/2008), ATTENTION DEFICIT, W/HYPERACTIVITY (06/03/2006), Bipolar 1 disorder (HCC), Generalized anxiety disorder (08/01/2023), Otitis externa of both ears (01/10/2014), Rash and nonspecific skin eruption (01/10/2014), Right groin pain (05/27/2012), Suicidal ideation (10/30/2016), and UNSPECIFIED HYDROCELE (03/12/2009).  He presented on 08/01/2023  2:42 PM for Bipolar 1 disorder (HCC).  He presented IVC'd for jumping out of a moving vehicle.  Diagnoses / Active Problems: Patient Active Problem List   Diagnosis Date Noted   Bipolar 1 disorder (HCC), current episode manic 08/01/2023   HIV disease (HCC)  09/23/2020      PLAN: Safety and Monitoring:  -- Involuntary admission to inpatient psychiatric unit for safety, stabilization and treatment  -- Daily contact with patient to assess and evaluate symptoms and progress in treatment  -- Patient's case to be discussed in multi-disciplinary team meeting  -- Observation Level : q15 minute checks  -- Vital signs:  q12 hours  -- Precautions: suicide, elopement, and assault  2. Psychiatric Diagnoses and Treatment:  Patient Active Problem List   Diagnosis Date Noted   Bipolar 1 disorder (HCC), current episode manic 08/01/2023   HIV disease (HCC) 09/23/2020     Scheduled Medications:  bictegravir-emtricitabine -tenofovir  AF  1 tablet Oral Daily   multivitamin with minerals  1 tablet Oral Daily   risperiDONE  2 mg Oral BID   thiamine  100 mg Oral Daily   traZODone  50 mg Oral QHS   valproic acid   500 mg Oral TID    As Needed Medications: acetaminophen , alum & mag hydroxide-simeth, haloperidol **AND** diphenhydrAMINE, haloperidol lactate **AND** diphenhydrAMINE **AND** LORazepam , haloperidol lactate **AND** diphenhydrAMINE **AND** LORazepam , hydrOXYzine, loperamide, LORazepam , magnesium  hydroxide, nicotine, nicotine polacrilex, ondansetron     3. Medical Issues Being Addressed:   -- HIV, vitamin D deficiency, and vitamin B12 deficiency as above  Labs reviewed, unremarkable with the exception of: Alcohol 209 on admission, UDS THC positive, vitamin D 14.98, vitamin B12 140   Tobacco Use Disorder  --  Patient in need of nicotine replacement; nicotine polacrilex (gum) and nicotine patch 21 mg / 24 hours ordered. Smoking cessation encouraged    4. Discharge Planning:   -- Social work and case management to assist with discharge planning and identification of hospital follow-up needs prior to discharge  -- Estimated LOS: 7 to 10 days  -- Discharge Concerns: Need to establish a safety plan; Medication compliance and effectiveness  --  Discharge Goals: Return home with outpatient referrals for mental health follow-up including medication management/psychotherapy  5. Short Term Goals:  Improve ability to identify changes in lifestyle to reduce recurrence of condition, verbalize feelings, disclose and discuss suicidal ideas, demonstrate self-control, identify and develop effective coping behaviors, compliance with prescribed medications, identify triggers associated with substance abuse/mental health issues, participate in unit milieu and in scheduled group therapies   6. Long Term Goals: Improvement in symptoms so the patient is ready for discharge   --The risks/benefits/side-effects/alternatives to the medications above were discussed in detail with the patient and time was given for questions. The patient provided informed consent.   -- Metabolic profile and EKG monitoring obtained  while on an atypical antipsychotic and listed in the EHR    Total Time Spent in Direct Patient Care:  I personally spent 35 minutes on the unit in direct patient care. The direct patient care time included face-to-face time with the patient, reviewing the patient's chart, communicating with other professionals, and coordinating care. Greater than 50% of this time was spent in counseling or coordinating care with the patient regarding goals of hospitalization, psycho-education, and discharge planning needs.      Clair Crews, MD Psychiatrist  08/03/2023, 1:27 PM   I certify that inpatient services furnished can reasonably be expected to improve the patient's condition.    Portions of this note were created using voice recognition software. Minor syntax errors, grammatical content, spelling, or punctuation errors may have occurred unintentionally. Please notify the [facility] if the meaning of any statement is unclear.

## 2023-08-04 DIAGNOSIS — F319 Bipolar disorder, unspecified: Secondary | ICD-10-CM | POA: Diagnosis not present

## 2023-08-04 LAB — HEMOGLOBIN A1C
Hgb A1c MFr Bld: 4.9 % (ref 4.8–5.6)
Mean Plasma Glucose: 94 mg/dL

## 2023-08-04 MED ORDER — RISPERIDONE 3 MG PO TBDP
3.0000 mg | ORAL_TABLET | Freq: Two times a day (BID) | ORAL | Status: DC
  Administered 2023-08-04 – 2023-08-08 (×8): 3 mg via ORAL
  Filled 2023-08-04 (×14): qty 1

## 2023-08-04 NOTE — Plan of Care (Signed)
   Problem: Education: Goal: Emotional status will improve Outcome: Progressing Goal: Mental status will improve Outcome: Progressing   Problem: Activity: Goal: Interest or engagement in activities will improve Outcome: Progressing Goal: Sleeping patterns will improve Outcome: Progressing

## 2023-08-04 NOTE — Group Note (Signed)
 Date:  08/04/2023 Time:  12:03 PM  Group Topic/Focus:  Goals Group:   The focus of this group is to help patients establish daily goals to achieve during treatment and discuss how the patient can incorporate goal setting into their daily lives to aide in recovery. Orientation:   The focus of this group is to educate the patient on the purpose and policies of crisis stabilization and provide a format to answer questions about their admission.  The group details unit policies and expectations of patients while admitted.    Participation Level:  Minimal  Participation Quality:  Appropriate  Affect:  Appropriate  Cognitive:  Appropriate  Insight: Appropriate  Engagement in Group:  Developing/Improving  Modes of Intervention:  Discussion and Orientation  Additional Comments:    Shalynn Jorstad D Laqueena Hinchey 08/04/2023, 12:03 PM

## 2023-08-04 NOTE — Group Note (Signed)
 Date:  08/04/2023 Time:  4:02 PM  Group Topic/Focus:  Recovery Goals:   The focus of this group is to promote emotional wellness by identifying unhealthy thought patterns and how to challenge them.    Participation Level:  Minimal  Participation Quality:  Appropriate  Affect:  Appropriate  Cognitive:  Appropriate  Insight: Appropriate  Engagement in Group:  Developing/Improving  Modes of Intervention:  Discussion and Exploration  Additional Comments:    Hulen Mages Dylann Gallier 08/04/2023, 4:02 PM

## 2023-08-04 NOTE — Plan of Care (Signed)
  Problem: Education: Goal: Emotional status will improve Outcome: Progressing Goal: Mental status will improve Outcome: Progressing Goal: Verbalization of understanding the information provided will improve Outcome: Progressing   Problem: Activity: Goal: Interest or engagement in activities will improve Outcome: Progressing   Problem: Safety: Goal: Periods of time without injury will increase Outcome: Progressing

## 2023-08-04 NOTE — Group Note (Signed)
 Recreation Therapy Group Note   Group Topic:Team Building  Group Date: 08/04/2023 Start Time: 1610 End Time: 1000 Facilitators: Cleophus Mendonsa-McCall, LRT,CTRS Location: [address] Dayroom   Group Topic: Communication, Team Building, Problem Solving  Goal Area(s) Addresses:  Patient will effectively work with peer towards shared goal.  Patient will identify skills used to make activity successful.  Patient will identify how skills used during activity can be applied to reach post d/c goals.   Intervention: STEM Activity- Glass blower/designer  Activity: Tallest [employer]. In teams of 5-6, patients were given 11 craft pipe cleaners. Using the materials provided, patients were instructed to compete again the opposing team(s) to build the tallest free-standing structure from floor level. The activity was timed; difficulty increased by Clinical research associate as Production designer, theatre/television/film continued.  Systematically resources were removed with additional directions for example, placing one arm behind their back, working in silence, and shape stipulations. LRT facilitated post-activity discussion reviewing team processes and necessary communication skills involved in completion. Patients were encouraged to reflect how the skills utilized, or not utilized, in this activity can be incorporated to positively impact support systems post discharge.  Education: Pharmacist, community, Scientist, physiological, Discharge Planning   Education Outcome: Acknowledges education/In group clarification offered/Needs additional education.    Affect/Mood: Appropriate   Participation Level: Engaged   Participation Quality: Independent   Behavior: Appropriate   Speech/Thought Process: Focused   Insight: Good   Judgement: Good   Modes of Intervention: Team-building   Patient Response to Interventions:  Engaged   Education Outcome:  In group clarification offered    Clinical Observations/Individualized Feedback: Pt started off  quiet and observant. Pt opened up more as group went on. Pt began the leader in the creation of the tower with his group. Pt and peers were able to work together to create a structure that was sturdy and able to stand on its own.     Plan: Continue to engage patient in RT group sessions 2-3x/week.   Dmonte Maher-McCall, LRT,CTRS 08/04/2023 12:29 PM

## 2023-08-04 NOTE — Progress Notes (Signed)
   08/04/23 0800  Psych Admission Type (Psych Patients Only)  Admission Status Involuntary  Psychosocial Assessment  Patient Complaints Anxiety  Eye Contact Fair  Facial Expression Anxious  Affect Anxious  Speech Logical/coherent  Interaction Assertive  Motor Activity Restless  Appearance/Hygiene Unremarkable  Behavior Characteristics Cooperative  Mood Anxious;Pleasant  Thought Process  Coherency Circumstantial  Content Blaming others  Delusions None reported or observed  Perception WDL  Hallucination None reported or observed  Judgment Poor  Confusion None  Danger to Self  Current suicidal ideation? Denies  Agreement Not to Harm Self Yes  Description of Agreement Verbal  Danger to Others  Danger to Others None reported or observed

## 2023-08-04 NOTE — Progress Notes (Signed)
   08/04/23 2115  Psych Admission Type (Psych Patients Only)  Admission Status Involuntary  Psychosocial Assessment  Patient Complaints Anxiety  Eye Contact Fair  Facial Expression Anxious  Affect Anxious  Speech Logical/coherent  Interaction Assertive  Motor Activity Restless  Appearance/Hygiene Unremarkable  Behavior Characteristics Cooperative  Mood Anxious;Pleasant  Aggressive Behavior  Effect No apparent injury  Thought Process  Coherency Circumstantial  Content Blaming others  Delusions None reported or observed  Perception WDL  Hallucination None reported or observed  Judgment Poor  Confusion WDL  Danger to Self  Current suicidal ideation? Denies  Danger to Others  Danger to Others None reported or observed

## 2023-08-04 NOTE — Progress Notes (Signed)
 Adult Psychoeducational Group Note  Date:  08/04/2023 Time:  9:05 PM  Group Topic/Focus:  Wrap-Up Group:   The focus of this group is to help patients review their daily goal of treatment and discuss progress on daily workbooks.  Participation Level:  Active  Participation Quality:  Appropriate  Affect:  Appropriate  Cognitive:  Appropriate  Insight: Appropriate  Engagement in Group:  Engaged  Modes of Intervention:  Discussion  Additional Comments:  He talk to his girlfriend today. This brought a smile to him and she has a job as a Administrator, arts.  Jerry Johnson 08/04/2023, 9:05 PM

## 2023-08-04 NOTE — Progress Notes (Signed)
 [facility] MD Progress Note  08/04/2023 12:15 PM Jerry Johnson  MRN:  [id number] Principal Problem: Bipolar 1 disorder (HCC) Diagnosis: Principal Problem:   Bipolar 1 disorder (HCC), current episode manic Active Problems:   HIV disease (HCC)   ID & Admission Information: Jerry Johnson is an 33 y.o. male who  has a past medical history of Acute pharyngitis (06/07/2013), Adjustment disorder with mixed anxiety and depressed mood (10/30/2016), Allergic rhinitis (01/11/2008), ATTENTION DEFICIT, W/HYPERACTIVITY (06/03/2006), Bipolar 1 disorder (HCC), Generalized anxiety disorder (08/01/2023), Otitis externa of both ears (01/10/2014), Rash and nonspecific skin eruption (01/10/2014), Right groin pain (05/27/2012), Suicidal ideation (10/30/2016), and UNSPECIFIED HYDROCELE (03/12/2009).  He presented on 08/01/2023  2:42 PM for Bipolar 1 disorder (HCC).  He presented under petition for involuntary commitment taking out by his mother for jumping out of a moving vehicle while manic.  Subjective:   Case was discussed in the multidisciplinary team. MAR was reviewed and patient was compliant with medications.  No acute events occurred overnight.  PRN's in last 24 hours: Hydroxyzine 25 mg administered at 2111  The patient was seen in the interview room during rounds.  He continues to present as manic and hyperverbal.  He demonstrates affective lability, and is demanding.  He is focused on discharge at this time.  He reports that he spoke with his mother last night and got fairly agitated when describing the conversation.  He does report that he is grateful to be back on his medications.  We discussed increasing Risperdal to 3 mg twice daily to better manage mood instability.  Will consider scheduling hydroxyzine if he gets it again tonight.   Past Psychiatric and Medical Medical History:  Past Medical History:  Diagnosis Date   Acute pharyngitis 06/07/2013   Adjustment disorder with mixed anxiety  and depressed mood 10/30/2016   Allergic rhinitis 01/11/2008   Replacing diagnoses that were inactivated after the 07/06/22 regulatory import     ATTENTION DEFICIT, W/HYPERACTIVITY 06/03/2006   Qualifier: Diagnosis of   By: Alen Amy      Replacing diagnoses that were inactivated after the 07/06/22 regulatory import     Bipolar 1 disorder (HCC)    Generalized anxiety disorder 08/01/2023   Otitis externa of both ears 01/10/2014   Rash and nonspecific skin eruption 01/10/2014   Right groin pain 05/27/2012   Suicidal ideation 10/30/2016   UNSPECIFIED HYDROCELE 03/12/2009   Qualifier: Diagnosis of  By: Jerone Moorman  MD, Sarina Curb      Past Surgical History:  Procedure Laterality Date   INGUINAL HERNIA REPAIR  03/21/09   Repair of R inguinal hernia with hydrocele    Family History(Medical and Psychiatric):  Family History  Problem Relation Age of Onset   Healthy Mother    Healthy Father    See H&P    Social History:  Social History   Substance and Sexual Activity  Alcohol Use Not Currently   Comment: rarely     Social History   Substance and Sexual Activity  Drug Use Yes   Frequency: 4.0 times per week   Types: Marijuana   Comment: some days    Social History   Socioeconomic History   Marital status: Single    Spouse name: Not on file   Number of children: Not on file   Years of education: Not on file   Highest education level: Not on file  Occupational History   Not on file  Tobacco Use   Smoking status: Some Days  Current packs/day: 0.25    Types: Cigarettes   Smokeless tobacco: Never   Tobacco comments:    Smokes a cigarette with breakfast and in the evening  Vaping Use   Vaping status: Never Used  Substance and Sexual Activity   Alcohol use: Not Currently    Comment: rarely   Drug use: Yes    Frequency: 4.0 times per week    Types: Marijuana    Comment: some days   Sexual activity: Yes    Birth control/protection: Condom    Comment: given  condoms  Other Topics Concern   Not on file  Social History Narrative   Not on file   Social Drivers of Health   Financial Resource Strain: Not on file  Food Insecurity: Unknown (08/01/2023)   Hunger Vital Sign    Worried About Running Out of Food in the Last Year: Patient declined    Ran Out of Food in the Last Year: Not on file  Transportation Needs: Patient Declined (08/01/2023)   PRAPARE - Administrator, Civil Service (Medical): Patient declined    Lack of Transportation (Non-Medical): Patient declined  Physical Activity: Not on file  Stress: Not on file  Social Connections: Not on file        Current Medications: Current Facility-Administered Medications  Medication Dose Route Frequency Provider Last Rate Last Admin   acetaminophen  (TYLENOL ) tablet 650 mg  650 mg Oral Q6H PRN Motley-Mangrum, Jadeka A, PMHNP       alum & mag hydroxide-simeth (MAALOX/MYLANTA) 200-200-20 MG/5ML suspension 30 mL  30 mL Oral Q4H PRN Motley-Mangrum, Jadeka A, PMHNP       bictegravir-emtricitabine -tenofovir  AF (BIKTARVY ) 50-200-25 MG per tablet 1 tablet  1 tablet Oral Daily Motley-Mangrum, Jadeka A, PMHNP   1 tablet at 08/04/23 0756   haloperidol (HALDOL) tablet 5 mg  5 mg Oral TID PRN Motley-Mangrum, Jadeka A, PMHNP       And   diphenhydrAMINE (BENADRYL) capsule 50 mg  50 mg Oral TID PRN Motley-Mangrum, Jadeka A, PMHNP       haloperidol lactate (HALDOL) injection 5 mg  5 mg Intramuscular TID PRN Motley-Mangrum, Jadeka A, PMHNP       And   diphenhydrAMINE (BENADRYL) injection 50 mg  50 mg Intramuscular TID PRN Motley-Mangrum, Jadeka A, PMHNP       And   LORazepam  (ATIVAN ) injection 2 mg  2 mg Intramuscular TID PRN Motley-Mangrum, Jadeka A, PMHNP       haloperidol lactate (HALDOL) injection 10 mg  10 mg Intramuscular TID PRN Motley-Mangrum, Jadeka A, PMHNP       And   diphenhydrAMINE (BENADRYL) injection 50 mg  50 mg Intramuscular TID PRN Motley-Mangrum, Jadeka A, PMHNP       And    LORazepam  (ATIVAN ) injection 2 mg  2 mg Intramuscular TID PRN Motley-Mangrum, Jadeka A, PMHNP       magnesium  hydroxide (MILK OF MAGNESIA) suspension 30 mL  30 mL Oral Daily PRN Motley-Mangrum, Jadeka A, PMHNP       multivitamin with minerals tablet 1 tablet  1 tablet Oral Daily Motley-Mangrum, Jadeka A, PMHNP   1 tablet at 08/04/23 0756   nicotine (NICODERM CQ - dosed in mg/24 hr) patch 7 mg  7 mg Transdermal Daily PRN McCarty, Artie, MD       nicotine polacrilex (NICORETTE) gum 2 mg  2 mg Oral PRN McCarty, Artie, MD       risperiDONE (RISPERDAL M-TABS) disintegrating tablet 3 mg  3  mg Oral BID Allsion Nogales S, MD       thiamine (Vitamin B-1) tablet 100 mg  100 mg Oral Daily Motley-Mangrum, Jadeka A, PMHNP   100 mg at 08/04/23 0756   traZODone (DESYREL) tablet 50 mg  50 mg Oral QHS Sade Hollon S, MD   50 mg at 08/03/23 2111   valproic acid  (DEPAKENE ) 250 MG/5ML solution 500 mg  500 mg Oral TID McCarty, Artie, MD   500 mg at 08/04/23 8295    Lab Results:  Results for orders placed or performed during the hospital encounter of 08/01/23 (from the past 48 hours)  VITAMIN D 25 Hydroxy (Vit-D Deficiency, Fractures)     Status: Abnormal   Collection Time: 08/03/23  6:17 AM  Result Value Ref Range   Vit D, 25-Hydroxy 14.98 (L) 30 - 100 ng/mL    Comment: (NOTE) Vitamin D deficiency has been defined by the Institute of Medicine  and an Endocrine Society practice guideline as a level of serum 25-OH  vitamin D less than 20 ng/mL (1,2). The Endocrine Society went on to  further define vitamin D insufficiency as a level between 21 and 29  ng/mL (2).  1. IOM (Institute of Medicine). 2010. Dietary reference intakes for  calcium and D. [place]: [employer]. 2. Holick MF, Binkley Burns, Bischoff-Ferrari HA, et al. Evaluation,  treatment, and prevention of vitamin D deficiency: an Endocrine  Society clinical practice guideline, JCEM. 2011 Jul; 96(7): 1911-30.  Performed at [facility], [address], [place], [place] [address]   Folate     Status: None   Collection Time: 08/03/23  6:17 AM  Result Value Ref Range   Folate 6.1 >5.9 ng/mL    Comment: Performed at [facility], [address], [place], [place] [address]  Vitamin B12     Status: Abnormal   Collection Time: 08/03/23  6:17 AM  Result Value Ref Range   Vitamin B-12 140 (L) 180 - 914 pg/mL    Comment: (NOTE) This assay is not validated for testing neonatal or myeloproliferative syndrome specimens for Vitamin B12 levels. Performed at [facility], [address], [place], [place] [address]   TSH     Status: None   Collection Time: 08/03/23  6:17 AM  Result Value Ref Range   TSH 1.730 0.350 - 4.500 uIU/mL    Comment: Performed by a 3rd Generation assay with a functional sensitivity of <=0.01 uIU/mL. Performed at [facility], [address] [place], [place] [address]   Lipid panel     Status: None   Collection Time: 08/03/23  6:17 AM  Result Value Ref Range   Cholesterol 163 0 - 200 mg/dL   Triglycerides 70 <132 mg/dL   HDL 61 >44 mg/dL   Total CHOL/HDL Ratio 2.7 RATIO   VLDL 14 0 - 40 mg/dL   LDL Cholesterol 88 0 - 99 mg/dL    Comment:        Total Cholesterol/HDL:CHD Risk Coronary Heart Disease Risk Table                     Men   Women  1/2 Average Risk   3.4   3.3  Average Risk       5.0   4.4  2 X Average Risk   9.6   7.1  3 X Average Risk  23.4   11.0  Use the calculated Patient Ratio above and the CHD Risk Table to determine the patient's CHD Risk.        ATP III CLASSIFICATION (LDL):  <100     mg/dL   Optimal  161-096  mg/dL   Near or Above                    Optimal  130-159  mg/dL   Borderline  045-409  mg/dL   High  >811     mg/dL   Very High Performed at [facility], [address] [place], [place] [address]   Hemoglobin A1c     Status: None   Collection Time: 08/03/23  6:17  AM  Result Value Ref Range   Hgb A1c MFr Bld 4.9 4.8 - 5.6 %    Comment: (NOTE)         Prediabetes: 5.7 - 6.4         Diabetes: >6.4         Glycemic control for adults with diabetes: <7.0    Mean Plasma Glucose 94 mg/dL    Comment: (NOTE) Performed At: [facility] [address] [place], [place] [id number] Pearlean Botts MD MV:[id number]     Blood Alcohol level:  Lab Results  Component Value Date   ETH 209 (H) 07/31/2023   ETH <10 10/06/2019    Metabolic Disorder Labs: Lab Results  Component Value Date   HGBA1C 4.9 08/03/2023   MPG 94 08/03/2023   No results found for: "PROLACTIN" Lab Results  Component Value Date   CHOL 163 08/03/2023   TRIG 70 08/03/2023   HDL 61 08/03/2023   CHOLHDL 2.7 08/03/2023   VLDL 14 08/03/2023   LDLCALC 88 08/03/2023   LDLCALC 94 09/23/2020    Physical Findings: AIMS:  , ,  ,  ,    CIWA:  CIWA-Ar Total: 0 COWS:     Psychiatric Specialty Exam:  Presentation  General Appearance: Bizarre  Eye Contact: Fair  Speech: Pressured (Hyperverbal)  Speech Volume: Increased  Handedness: Right   Mood and Affect  Mood: Anxious; Irritable; Euphoric  Affect: Inappropriate   Thought Process  Thought Processes: Linear  Descriptions of Associations: Intact  Orientation: Full (Time, Place and Person)  Thought Content: Perseveration  History of Schizophrenia/Schizoaffective disorder: No data recorded Duration of Psychotic Symptoms: NA Hallucinations: Hallucinations: None  Ideas of Reference: None  Suicidal Thoughts: Suicidal Thoughts: No  Homicidal Thoughts: Homicidal Thoughts: No   Sensorium  Memory: Immediate Good; Recent Good  Judgment: Poor  Insight: Poor   Executive Functions  Concentration: Good  Attention Span: Good  Recall: Good  Fund of Knowledge: Good  Language: Good   Psychomotor Activity  Psychomotor Activity: Psychomotor Activity: Normal   Assets  Assets: Communication Skills;  Social Support; Housing   Sleep  Sleep: Sleep: Fair   Musculoskeletal: Strength & Muscle Tone: within normal limits Gait & Station: normal Patient leans: N/A   Physical Exam: General: Sitting comfortably. NAD. HEENT: Normocephalic, atraumatic, MMM, EMOI Lungs: no increased work of breathing noted Heart: no cyanosis Abdomen: Non distended Musculoskeletal: FROM. No obvious deformities Skin: Warm, dry, intact. No rashes noted Neuro: No obvious focal deficits.  Gait and station are normal  Review of Systems  Constitutional: Negative.   HENT: Negative.    Eyes: Negative.   Respiratory: Negative.    Cardiovascular: Negative.   Gastrointestinal: Negative.   Genitourinary: Negative.   Skin: Negative.   Neurological: Negative.   Psychiatric/Behavioral:  Positive for mania.     Blood pressure 133/81, pulse 87, temperature 98.7 F (37.1 C), temperature source Oral, resp. rate 16, height 5\' 11"  (1.803 m), weight 71.2 kg, SpO2 99%. Body mass index is 21.9 kg/m.  ASSESSMENT: Jerry Johnson is an 33 y.o. male who  has a past medical history of Acute pharyngitis (06/07/2013), Adjustment disorder with mixed anxiety and depressed mood (10/30/2016), Allergic rhinitis (01/11/2008), ATTENTION DEFICIT, W/HYPERACTIVITY (06/03/2006), Bipolar 1 disorder (HCC), Generalized anxiety disorder (08/01/2023), Otitis externa of both ears (01/10/2014), Rash and nonspecific skin eruption (01/10/2014), Right groin pain (05/27/2012), Suicidal ideation (10/30/2016), and UNSPECIFIED HYDROCELE (03/12/2009).  He presented on 08/01/2023  2:42 PM for Bipolar 1 disorder (HCC).  He presented IVC for jumping out of a moving vehicle while manic.  Diagnoses / Active Problems: Patient Active Problem List   Diagnosis Date Noted   Bipolar 1 disorder (HCC), current episode manic 08/01/2023   HIV disease (HCC) 09/23/2020      PLAN: Safety and Monitoring:  -- Involuntary admission to inpatient psychiatric unit  for safety, stabilization and treatment  -- Daily contact with patient to assess and evaluate symptoms and progress in treatment  -- Patient's case to be discussed in multi-disciplinary team meeting  -- Observation Level : q15 minute checks  -- Vital signs:  q12 hours  -- Precautions: suicide, elopement, and assault  2. Psychiatric Diagnoses and Treatment:  Patient Active Problem List   Diagnosis Date Noted   Bipolar 1 disorder (HCC), current episode manic 08/01/2023   HIV disease (HCC) 09/23/2020     Scheduled Medications:  bictegravir-emtricitabine -tenofovir  AF  1 tablet Oral Daily   multivitamin with minerals  1 tablet Oral Daily   risperiDONE  3 mg Oral BID   thiamine  100 mg Oral Daily   traZODone  50 mg Oral QHS   valproic acid   500 mg Oral TID    As Needed Medications: acetaminophen , alum & mag hydroxide-simeth, haloperidol **AND** diphenhydrAMINE, haloperidol lactate **AND** diphenhydrAMINE **AND** LORazepam , haloperidol lactate **AND** diphenhydrAMINE **AND** LORazepam , magnesium  hydroxide, nicotine, nicotine polacrilex    3. Medical Issues Being Addressed:   -- HIV, vitamin D deficiency, and vitamin B12 deficiency as above  Labs reviewed, unremarkable with the exception of: Alcohol 209 on admission, UDS THC positive, vitamin D 14.98, vitamin B12 140   Tobacco Use Disorder  --  Patient in need of nicotine replacement; nicotine polacrilex (gum) and nicotine patch 21 mg / 24 hours ordered. Smoking cessation encouraged    4. Discharge Planning:   -- Social work and case management to assist with discharge planning and identification of hospital follow-up needs prior to discharge  -- Estimated LOS: 7 to 10 days  -- Discharge Concerns: Need to establish a safety plan; Medication compliance and effectiveness  -- Discharge Goals: Return home with outpatient referrals for mental health follow-up including medication management/psychotherapy  5. Short Term Goals:  Improve  ability to identify changes in lifestyle to reduce recurrence of condition, verbalize feelings, disclose and discuss suicidal ideas, demonstrate self-control, identify and develop effective coping behaviors, compliance with prescribed medications, identify triggers associated with substance abuse/mental health issues, participate in unit milieu and in scheduled group therapies   6. Long Term Goals: Improvement in symptoms so the patient is ready for discharge   --The risks/benefits/side-effects/alternatives to the medications above were discussed in detail with the patient and time was given for questions. The patient provided informed consent.   -- Metabolic profile and EKG monitoring obtained while on an  atypical antipsychotic and listed in the EHR    Total Time Spent in Direct Patient Care:  I personally spent 35 minutes on the unit in direct patient care. The direct patient care time included face-to-face time with the patient, reviewing the patient's chart, communicating with other professionals, and coordinating care. Greater than 50% of this time was spent in counseling or coordinating care with the patient regarding goals of hospitalization, psycho-education, and discharge planning needs.      Clair Crews, MD Psychiatrist  08/04/2023, 12:15 PM   I certify that inpatient services furnished can reasonably be expected to improve the patient's condition.    Portions of this note were created using voice recognition software. Minor syntax errors, grammatical content, spelling, or punctuation errors may have occurred unintentionally. Please notify the [facility] if the meaning of any statement is unclear.

## 2023-08-05 DIAGNOSIS — F319 Bipolar disorder, unspecified: Secondary | ICD-10-CM | POA: Diagnosis not present

## 2023-08-05 MED ORDER — VITAMIN B-12 1000 MCG PO TABS
1000.0000 ug | ORAL_TABLET | Freq: Every day | ORAL | Status: DC
  Administered 2023-08-05 – 2023-08-08 (×4): 1000 ug via ORAL
  Filled 2023-08-05 (×7): qty 1

## 2023-08-05 MED ORDER — VITAMIN D (ERGOCALCIFEROL) 1.25 MG (50000 UNIT) PO CAPS
50000.0000 [IU] | ORAL_CAPSULE | Freq: Every day | ORAL | Status: DC
Start: 2023-08-05 — End: 2023-08-08
  Administered 2023-08-05 – 2023-08-08 (×4): 50000 [IU] via ORAL
  Filled 2023-08-05 (×7): qty 1

## 2023-08-05 NOTE — Progress Notes (Signed)
 [facility] MD Progress Note  08/05/2023 12:30 PM Jerry Johnson  MRN:  [id number] Principal Problem: Bipolar 1 disorder (HCC) Diagnosis: Principal Problem:   Bipolar 1 disorder (HCC), current episode manic Active Problems:   HIV disease (HCC)   ID & Admission Information: Jerry Johnson is an 33 y.o. male who  has a past medical history of Acute pharyngitis (06/07/2013), Adjustment disorder with mixed anxiety and depressed mood (10/30/2016), Allergic rhinitis (01/11/2008), ATTENTION DEFICIT, W/HYPERACTIVITY (06/03/2006), Bipolar 1 disorder (HCC), Generalized anxiety disorder (08/01/2023), Otitis externa of both ears (01/10/2014), Rash and nonspecific skin eruption (01/10/2014), Right groin pain (05/27/2012), Suicidal ideation (10/30/2016), and UNSPECIFIED HYDROCELE (03/12/2009).  He presented on 08/01/2023  2:42 PM for Bipolar 1 disorder (HCC).  He presented under petition for involuntary commitment taking out by his mother for jumping out of a moving vehicle while manic.  Subjective:   Case was discussed in the multidisciplinary team. MAR was reviewed and patient was compliant with medications.  No acute events occurred overnight.  PRN's in last 24 hours: None  The patient was seen in his room during rounds.  He continues to present as manic and hyperverbal.  He continues to demonstrates affective lability, and remains demanding.  He wears a towel on his head during the interview and makes poor eye contact.  He keeps insisting that he must be discharged for a court date; however, after consulting the [place]  court systems website it does not appear that he has a court date scheduled at this time.  He previously missed a court date (on March 22, 2023), so the court issued a warrant for his arrest and forfeited his $2,500 bond. On July 19, 2023, he was arrested or surrendered, and a new $20,000 bond was posted-this means the warrant has been served, and he is no longer considered  a fugitive. As of now, there is no new court date scheduled in the system yet.   The patient remains focused on discharge and spends a lot of the interview bargaining to be released earlier.  The rationale for continued hospitalization was explained.  He tells me that the charges against him are false, but then tells me that he did in fact violate the protective order.  He did not comment on the charge of larceny after breaking and entering.  He appears to be splitting staff, stating that the social worker was "rude to me".  He has been noted to be extremely intrusive, and frequently seeks to speak to people repeatedly after meetings have been concluded.  He does report that he is getting adequate sleep.  He has a Depakote  level scheduled in the morning, so I will hold off on increasing Depakote  today.  He denies issues with increasing Risperdal  to 3 mg twice daily.    Past Psychiatric and Medical Medical History:  Past Medical History:  Diagnosis Date   Acute pharyngitis 06/07/2013   Adjustment disorder with mixed anxiety and depressed mood 10/30/2016   Allergic rhinitis 01/11/2008   Replacing diagnoses that were inactivated after the 07/06/22 regulatory import     ATTENTION DEFICIT, W/HYPERACTIVITY 06/03/2006   Qualifier: Diagnosis of   By: Alen Amy      Replacing diagnoses that were inactivated after the 07/06/22 regulatory import     Bipolar 1 disorder (HCC)    Generalized anxiety disorder 08/01/2023   Otitis externa of both ears 01/10/2014   Rash and nonspecific skin eruption 01/10/2014   Right groin pain 05/27/2012   Suicidal ideation 10/30/2016  UNSPECIFIED HYDROCELE 03/12/2009   Qualifier: Diagnosis of  By: Jerone Moorman  MD, Sarina Curb      Past Surgical History:  Procedure Laterality Date   INGUINAL HERNIA REPAIR  03/21/09   Repair of R inguinal hernia with hydrocele    Family History(Medical and Psychiatric):  Family History  Problem Relation Age of Onset   Healthy  Mother    Healthy Father    See H&P    Social History:  Social History   Substance and Sexual Activity  Alcohol Use Not Currently   Comment: rarely     Social History   Substance and Sexual Activity  Drug Use Yes   Frequency: 4.0 times per week   Types: Marijuana   Comment: some days    Social History   Socioeconomic History   Marital status: Single    Spouse name: Not on file   Number of children: Not on file   Years of education: Not on file   Highest education level: Not on file  Occupational History   Not on file  Tobacco Use   Smoking status: Some Days    Current packs/day: 0.25    Types: Cigarettes   Smokeless tobacco: Never   Tobacco comments:    Smokes a cigarette with breakfast and in the evening  Vaping Use   Vaping status: Never Used  Substance and Sexual Activity   Alcohol use: Not Currently    Comment: rarely   Drug use: Yes    Frequency: 4.0 times per week    Types: Marijuana    Comment: some days   Sexual activity: Yes    Birth control/protection: Condom    Comment: given condoms  Other Topics Concern   Not on file  Social History Narrative   Not on file   Social Drivers of Health   Financial Resource Strain: Not on file  Food Insecurity: Unknown (08/01/2023)   Hunger Vital Sign    Worried About Running Out of Food in the Last Year: Patient declined    Ran Out of Food in the Last Year: Not on file  Transportation Needs: Patient Declined (08/01/2023)   PRAPARE - Administrator, Civil Service (Medical): Patient declined    Lack of Transportation (Non-Medical): Patient declined  Physical Activity: Not on file  Stress: Not on file  Social Connections: Not on file        Current Medications: Current Facility-Administered Medications  Medication Dose Route Frequency Provider Last Rate Last Admin   acetaminophen  (TYLENOL ) tablet 650 mg  650 mg Oral Q6H PRN Motley-Mangrum, Jadeka A, PMHNP       alum & mag hydroxide-simeth  (MAALOX/MYLANTA) 200-200-20 MG/5ML suspension 30 mL  30 mL Oral Q4H PRN Motley-Mangrum, Jadeka A, PMHNP       bictegravir-emtricitabine -tenofovir  AF (BIKTARVY ) 50-200-25 MG per tablet 1 tablet  1 tablet Oral Daily Motley-Mangrum, Jadeka A, PMHNP   1 tablet at 08/05/23 0953   cyanocobalamin  (VITAMIN B12) tablet 1,000 mcg  1,000 mcg Oral Daily Timmothy Foots, MD       haloperidol  (HALDOL ) tablet 5 mg  5 mg Oral TID PRN Motley-Mangrum, Jadeka A, PMHNP       And   diphenhydrAMINE  (BENADRYL ) capsule 50 mg  50 mg Oral TID PRN Motley-Mangrum, Jadeka A, PMHNP       haloperidol  lactate (HALDOL ) injection 5 mg  5 mg Intramuscular TID PRN Motley-Mangrum, Jadeka A, PMHNP       And   diphenhydrAMINE  (BENADRYL ) injection 50  mg  50 mg Intramuscular TID PRN Motley-Mangrum, Jadeka A, PMHNP       And   LORazepam  (ATIVAN ) injection 2 mg  2 mg Intramuscular TID PRN Motley-Mangrum, Jadeka A, PMHNP       haloperidol  lactate (HALDOL ) injection 10 mg  10 mg Intramuscular TID PRN Motley-Mangrum, Jadeka A, PMHNP       And   diphenhydrAMINE  (BENADRYL ) injection 50 mg  50 mg Intramuscular TID PRN Motley-Mangrum, Jadeka A, PMHNP       And   LORazepam  (ATIVAN ) injection 2 mg  2 mg Intramuscular TID PRN Motley-Mangrum, Jadeka A, PMHNP       magnesium  hydroxide (MILK OF MAGNESIA) suspension 30 mL  30 mL Oral Daily PRN Motley-Mangrum, Jadeka A, PMHNP       multivitamin with minerals tablet 1 tablet  1 tablet Oral Daily Motley-Mangrum, Jadeka A, PMHNP   1 tablet at 08/05/23 0953   nicotine  (NICODERM CQ  - dosed in mg/24 hr) patch 7 mg  7 mg Transdermal Daily PRN McCarty, Artie, MD       nicotine  polacrilex (NICORETTE ) gum 2 mg  2 mg Oral PRN McCarty, Artie, MD       risperiDONE  (RISPERDAL  M-TABS) disintegrating tablet 3 mg  3 mg Oral BID Shanyce Daris S, MD   3 mg at 08/05/23 1610   thiamine  (Vitamin B-1) tablet 100 mg  100 mg Oral Daily Motley-Mangrum, Jadeka A, PMHNP   100 mg at 08/05/23 9604   traZODone  (DESYREL ) tablet 50  mg  50 mg Oral QHS Shivon Hackel S, MD   50 mg at 08/04/23 2056   valproic  acid (DEPAKENE ) 250 MG/5ML solution 500 mg  500 mg Oral TID McCarty, Artie, MD   500 mg at 08/05/23 5409   Vitamin D  (Ergocalciferol ) (DRISDOL ) 1.25 MG (50000 UNIT) capsule 50,000 Units  50,000 Units Oral Daily Timmothy Foots, MD        Lab Results:  No results found for this or any previous visit (from the past 48 hours).   Blood Alcohol level:  Lab Results  Component Value Date   ETH 209 (H) 07/31/2023   ETH <10 10/06/2019    Metabolic Disorder Labs: Lab Results  Component Value Date   HGBA1C 4.9 08/03/2023   MPG 94 08/03/2023   No results found for: "PROLACTIN" Lab Results  Component Value Date   CHOL 163 08/03/2023   TRIG 70 08/03/2023   HDL 61 08/03/2023   CHOLHDL 2.7 08/03/2023   VLDL 14 08/03/2023   LDLCALC 88 08/03/2023   LDLCALC 94 09/23/2020    Physical Findings: AIMS:  , ,  ,  ,    CIWA:  CIWA-Ar Total: 0 COWS:     Psychiatric Specialty Exam:  Presentation  General Appearance: Bizarre  Eye Contact: Fair  Speech: Pressured (Hyperverbal)  Speech Volume: Increased  Handedness: Right   Mood and Affect  Mood: Anxious; Irritable; Euphoric  Affect: Inappropriate   Thought Process  Thought Processes: Linear  Descriptions of Associations: Intact  Orientation: Full (Time, Place and Person)  Thought Content: Perseveration  History of Schizophrenia/Schizoaffective disorder: No data recorded Duration of Psychotic Symptoms: NA Hallucinations: No data recorded  Ideas of Reference: None  Suicidal Thoughts: No data recorded  Homicidal Thoughts: No data recorded   Sensorium  Memory: Immediate Good; Recent Good  Judgment: Poor  Insight: Poor   Executive Functions  Concentration: Good  Attention Span: Good  Recall: Good  Fund of Knowledge: Good  Language: Good  Psychomotor Activity  Psychomotor Activity: No data recorded   Assets  Assets:  Communication Skills; Social Support; Housing   Sleep  Sleep: No data recorded   Musculoskeletal: Strength & Muscle Tone: within normal limits Gait & Station: normal Patient leans: N/A   Physical Exam: General: Sitting comfortably. NAD. HEENT: Normocephalic, atraumatic, MMM, EMOI Lungs: no increased work of breathing noted Heart: no cyanosis Abdomen: Non distended Musculoskeletal: FROM. No obvious deformities Skin: Warm, dry, intact. No rashes noted Neuro: No obvious focal deficits.  Gait and station are normal  Review of Systems  Constitutional: Negative.   HENT: Negative.    Eyes: Negative.   Respiratory: Negative.    Cardiovascular: Negative.   Gastrointestinal: Negative.   Genitourinary: Negative.   Skin: Negative.   Neurological: Negative.   Psychiatric/Behavioral:  Positive for mania.     Blood pressure 124/73, pulse 82, temperature 97.7 F (36.5 C), temperature source Oral, resp. rate 16, height 5\' 11"  (1.803 m), weight 71.2 kg, SpO2 100%. Body mass index is 21.9 kg/m.  ASSESSMENT: Jerry Johnson is an 33 y.o. male who  has a past medical history of Acute pharyngitis (06/07/2013), Adjustment disorder with mixed anxiety and depressed mood (10/30/2016), Allergic rhinitis (01/11/2008), ATTENTION DEFICIT, W/HYPERACTIVITY (06/03/2006), Bipolar 1 disorder (HCC), Generalized anxiety disorder (08/01/2023), Otitis externa of both ears (01/10/2014), Rash and nonspecific skin eruption (01/10/2014), Right groin pain (05/27/2012), Suicidal ideation (10/30/2016), and UNSPECIFIED HYDROCELE (03/12/2009).  He presented on 08/01/2023  2:42 PM for Bipolar 1 disorder (HCC).  He presented IVC for jumping out of a moving vehicle while manic.  Diagnoses / Active Problems: Patient Active Problem List   Diagnosis Date Noted   Bipolar 1 disorder (HCC), current episode manic 08/01/2023   HIV disease (HCC) 09/23/2020      PLAN: Safety and Monitoring:  -- Involuntary admission to  inpatient psychiatric unit for safety, stabilization and treatment  -- Daily contact with patient to assess and evaluate symptoms and progress in treatment  -- Patient's case to be discussed in multi-disciplinary team meeting  -- Observation Level : q15 minute checks  -- Vital signs:  q12 hours  -- Precautions: suicide, elopement, and assault  2. Psychiatric Diagnoses and Treatment:  Patient Active Problem List   Diagnosis Date Noted   Bipolar 1 disorder (HCC), current episode manic 08/01/2023   HIV disease (HCC) 09/23/2020     Scheduled Medications:  bictegravir-emtricitabine -tenofovir  AF  1 tablet Oral Daily   vitamin B-12  1,000 mcg Oral Daily   multivitamin with minerals  1 tablet Oral Daily   risperiDONE   3 mg Oral BID   thiamine   100 mg Oral Daily   traZODone   50 mg Oral QHS   valproic  acid  500 mg Oral TID   Vitamin D  (Ergocalciferol )  50,000 Units Oral Daily    As Needed Medications: acetaminophen , alum & mag hydroxide-simeth, haloperidol  **AND** diphenhydrAMINE , haloperidol  lactate **AND** diphenhydrAMINE  **AND** LORazepam , haloperidol  lactate **AND** diphenhydrAMINE  **AND** LORazepam , magnesium  hydroxide, nicotine , nicotine  polacrilex    3. Medical Issues Being Addressed:   -- HIV, vitamin D  deficiency, and vitamin B12 deficiency as above  Labs reviewed, unremarkable with the exception of: Alcohol 209 on admission, UDS THC positive, vitamin D  14.98, vitamin B12 140   Tobacco Use Disorder  --  Patient in need of nicotine  replacement; nicotine  polacrilex (gum) and nicotine  patch 21 mg / 24 hours ordered. Smoking cessation encouraged    4. Discharge Planning:   -- Social work and case management to assist with discharge  planning and identification of hospital follow-up needs prior to discharge  -- Estimated LOS: 7 to 10 days  -- Discharge Concerns: Need to establish a safety plan; Medication compliance and effectiveness  -- Discharge Goals: Return home with  outpatient referrals for mental health follow-up including medication management/psychotherapy  5. Short Term Goals:  Improve ability to identify changes in lifestyle to reduce recurrence of condition, verbalize feelings, disclose and discuss suicidal ideas, demonstrate self-control, identify and develop effective coping behaviors, compliance with prescribed medications, identify triggers associated with substance abuse/mental health issues, participate in unit milieu and in scheduled group therapies   6. Long Term Goals: Improvement in symptoms so the patient is ready for discharge   --The risks/benefits/side-effects/alternatives to the medications above were discussed in detail with the patient and time was given for questions. The patient provided informed consent.   -- Metabolic profile and EKG monitoring obtained while on an atypical antipsychotic and listed in the EHR    Total Time Spent in Direct Patient Care:  I personally spent 35 minutes on the unit in direct patient care. The direct patient care time included face-to-face time with the patient, reviewing the patient's chart, communicating with other professionals, and coordinating care. Greater than 50% of this time was spent in counseling or coordinating care with the patient regarding goals of hospitalization, psycho-education, and discharge planning needs.      Clair Crews, MD Psychiatrist  08/05/2023, 12:30 PM   I certify that inpatient services furnished can reasonably be expected to improve the patient's condition.    Portions of this note were created using voice recognition software. Minor syntax errors, grammatical content, spelling, or punctuation errors may have occurred unintentionally. Please notify the [facility] if the meaning of any statement is unclear.

## 2023-08-05 NOTE — Progress Notes (Signed)
   08/05/23 2300  Psych Admission Type (Psych Patients Only)  Admission Status Involuntary  Psychosocial Assessment  Patient Complaints Anxiety  Eye Contact Fair  Facial Expression Anxious  Affect Anxious  Speech Logical/coherent  Interaction Assertive  Motor Activity Restless  Appearance/Hygiene Unremarkable  Behavior Characteristics Cooperative  Mood Anxious  Thought Process  Coherency WDL  Content WDL  Delusions None reported or observed  Perception WDL  Hallucination None reported or observed  Judgment Poor  Confusion None  Danger to Self  Current suicidal ideation? Denies  Description of Suicide Plan None  Agreement Not to Harm Self Yes  Description of Agreement Verbal contract for safety  Danger to Others  Danger to Others None reported or observed

## 2023-08-05 NOTE — [facility] Group Notes (Signed)
 Spirituality Group   Focus of discussion: Gratitude and Strength Awareness   Process: Following theoretical framework of group therapy of Irvin Yalom and further informed by Rogerian and Relational Cultural Theory approaches, participants invited to name:   *Sources of gratitude (internal>external)  *Articulate gratitude for self  *Name a personal strength/gift/skill  *Locate points of resonance among group members/engage the "here and now"   Conclude with grounding/breathwork     Observations: Jerry Johnson was withdrawn or may have not felt well, sitting much of the time with a towel over his face. Unclear if he was engaged in the group discussion.  [name] Aly, M.Div  [phone]

## 2023-08-05 NOTE — Plan of Care (Signed)
   Problem: Education: Goal: Emotional status will improve Outcome: Progressing Goal: Mental status will improve Outcome: Progressing Goal: Verbalization of understanding the information provided will improve Outcome: Progressing   Problem: Activity: Goal: Interest or engagement in activities will improve Outcome: Progressing

## 2023-08-05 NOTE — Group Note (Signed)
 Occupational Therapy Group Note  Group Topic:Coping Skills  Group Date: 08/05/2023 Start Time: 1358 End Time: 1432 Facilitators: Lynnda Sas, OT   Group Description: Group encouraged increased engagement and participation through discussion and activity focused on "Coping Ahead." Patients were split up into teams and selected a card from a stack of positive coping strategies. Patients were instructed to act out/charade the coping skill for other peers to guess and receive points for their team. Discussion followed with a focus on identifying additional positive coping strategies and patients shared how they were going to cope ahead over the weekend while continuing hospitalization stay.  Therapeutic Goal(s): Identify positive vs negative coping strategies. Identify coping skills to be used during hospitalization vs coping skills outside of hospital/at home Increase participation in therapeutic group environment and promote engagement in treatment   Participation Level: Engaged   Participation Quality: Independent   Behavior: Appropriate   Speech/Thought Process: Relevant   Affect/Mood: Appropriate   Insight: Fair   Judgement: Fair      Modes of Intervention: Education  Patient Response to Interventions:  Attentive   Plan: Continue to engage patient in OT groups 2 - 3x/week.  08/05/2023  Lynnda Sas, OT  Yevette Knust, OT

## 2023-08-05 NOTE — Plan of Care (Signed)
   Problem: Education: Goal: Knowledge of [facility] Education information/materials will improve Outcome: Progressing Goal: Emotional status will improve Outcome: Progressing Goal: Mental status will improve Outcome: Progressing Goal: Verbalization of understanding the information provided will improve Outcome: Progressing

## 2023-08-05 NOTE — Progress Notes (Signed)
   08/05/23 1503  Psych Admission Type (Psych Patients Only)  Admission Status Involuntary  Psychosocial Assessment  Patient Complaints None  Eye Contact Fair  Facial Expression Animated  Affect Anxious  Speech Logical/coherent  Interaction Assertive  Motor Activity Restless  Appearance/Hygiene Unremarkable  Behavior Characteristics Cooperative  Mood Anxious;Pleasant  Thought Process  Coherency WDL  Content WDL  Delusions None reported or observed  Perception WDL  Hallucination None reported or observed  Judgment Poor  Confusion None  Danger to Self  Current suicidal ideation? Denies  Agreement Not to Harm Self Yes  Description of Agreement Verbal  Danger to Others  Danger to Others None reported or observed

## 2023-08-05 NOTE — Group Note (Signed)
 LCSW Group Therapy Note   Group Date: 08/05/2023 Start Time: 1100 End Time: 1200   Participation:  patient was present.  He listened and was respectful, and minimally participated in the discussion.  Type of Therapy:  Group Therapy  Title: Healing Flames: Navigating Anger with Compassion  Objective:  Foster self-awareness and promote compassion toward oneself and others when dealing with anger.  Goals: Help participants understand the underlying emotions and needs fueling anger. Provide coping strategies for healthier emotional expression and anger management.  Summary: This session explored anger as a volcano - an explosion driven by deeper feelings and unmet needs. Participants learned to identify anger triggers and underlying emotions, then practiced coping strategies like deep breathing, physical activity, and journaling. The group discussed healthy ways to manage anger before it escalates, using both personal reflection and shared experiences.  Therapeutic Modalities: Cognitive Behavioral Therapy (CBT): Challenging thoughts that fuel anger. Mindfulness: Increasing awareness of emotions and sensations.   Lilana Blasko O Ulysses Alper, LCSWA 08/05/2023  1:10 PM

## 2023-08-05 NOTE — Progress Notes (Signed)
 Adult Psychoeducational Group Note  Date:  08/05/2023 Time:  8:45 PM  Group Topic/Focus:  Wrap-Up Group:   The focus of this group is to help patients review their daily goal of treatment and discuss progress on daily workbooks.  Participation Level:  Active  Participation Quality:  Appropriate  Affect:  Appropriate  Cognitive:  Appropriate  Insight: Appropriate  Engagement in Group:  Engaged  Modes of Intervention:  Discussion  Additional Comments:  The group was a color description of their day. His color was gold bright lead by example and he is a leader and wants to lead by example.   Jerry Johnson 08/05/2023, 8:45 PM

## 2023-08-06 ENCOUNTER — Encounter (HOSPITAL_COMMUNITY): Payer: Self-pay

## 2023-08-06 DIAGNOSIS — E538 Deficiency of other specified B group vitamins: Secondary | ICD-10-CM | POA: Insufficient documentation

## 2023-08-06 DIAGNOSIS — F319 Bipolar disorder, unspecified: Secondary | ICD-10-CM | POA: Diagnosis not present

## 2023-08-06 DIAGNOSIS — E559 Vitamin D deficiency, unspecified: Secondary | ICD-10-CM | POA: Insufficient documentation

## 2023-08-06 LAB — VALPROIC ACID LEVEL: Valproic Acid Lvl: 60 ug/mL (ref 50–100)

## 2023-08-06 MED ORDER — DIVALPROEX SODIUM 500 MG PO DR TAB
1000.0000 mg | DELAYED_RELEASE_TABLET | Freq: Two times a day (BID) | ORAL | Status: DC
Start: 2023-08-06 — End: 2023-08-08
  Administered 2023-08-06 – 2023-08-08 (×5): 1000 mg via ORAL
  Filled 2023-08-06 (×10): qty 2

## 2023-08-06 NOTE — Plan of Care (Signed)
   Problem: Education: Goal: Emotional status will improve Outcome: Progressing Goal: Mental status will improve Outcome: Progressing Goal: Verbalization of understanding the information provided will improve Outcome: Progressing   Problem: Activity: Goal: Interest or engagement in activities will improve Outcome: Progressing

## 2023-08-06 NOTE — Group Note (Signed)
 Recreation Therapy Group Note   Group Topic:Leisure Education  Group Date: 08/06/2023 Start Time: 0930 End Time: 1005 Facilitators: Aleene Swanner-McCall, LRT,CTRS Location: [address] Dayroom   Group Topic: Leisure Education   Goal Area(s) Addresses:  Patient will successfully identify positive leisure and recreation activities.  Patient will acknowledge benefits of participation in healthy leisure activities post discharge.  Patient will actively work with peers toward a shared goal.   Intervention: Cooperative Group Game    Activity: Keep It Contractor. Patients were seated in a circle and given a beach ball. Patients were to toss the ball back and forth between each other. Patients were to remain seated throughout group. LRT timed patients on how long they could keep the ball moving without it coming to a stop. If the ball came to a complete stop, LRT would restart the timer.   Education:  Teacher, English as a foreign language, Leisure as Merchant navy officer, Programmer, applications, Building control surveyor   Education Outcome: Acknowledges education/In group clarification offered/Needs additional education   Affect/Mood: Appropriate   Participation Level: Engaged   Participation Quality: Independent   Behavior: Appropriate   Speech/Thought Process: Focused   Insight: Good   Judgement: Good   Modes of Intervention: Cooperative Play   Patient Response to Interventions:  Engaged   Education Outcome:  In group clarification offered    Clinical Observations/Individualized Feedback: Pt was bright and engaged. Pt worked well with peers. Pt was called out of group but returned near the end of group session.     Plan: Continue to engage patient in RT group sessions 2-3x/week.   Jaquel Glassburn-McCall, LRT,CTRS 08/06/2023 1:55 PM

## 2023-08-06 NOTE — Progress Notes (Signed)
   08/06/23 1118  Psych Admission Type (Psych Patients Only)  Admission Status Involuntary  Psychosocial Assessment  Patient Complaints None  Eye Contact Fair  Facial Expression Animated  Affect Anxious  Speech Logical/coherent  Interaction Assertive  Motor Activity Restless  Appearance/Hygiene In scrubs  Behavior Characteristics Cooperative;Calm  Mood Pleasant  Thought Process  Coherency WDL  Content WDL  Delusions None reported or observed  Perception WDL  Hallucination None reported or observed  Judgment Poor  Confusion None  Danger to Self  Current suicidal ideation? Denies  Agreement Not to Harm Self Yes  Description of Agreement Verbal  Danger to Others  Danger to Others None reported or observed

## 2023-08-06 NOTE — Progress Notes (Signed)
   08/06/23 2000  Psych Admission Type (Psych Patients Only)  Admission Status Involuntary  Psychosocial Assessment  Patient Complaints None  Eye Contact Fair  Facial Expression Animated  Affect Appropriate to circumstance  Speech Logical/coherent  Interaction Assertive  Motor Activity Other (Comment) (wnl)  Appearance/Hygiene In scrubs  Behavior Characteristics Cooperative  Mood Pleasant  Thought Process  Coherency WDL  Content WDL  Delusions None reported or observed  Perception WDL  Hallucination None reported or observed  Judgment Poor  Confusion None  Danger to Self  Current suicidal ideation? Denies

## 2023-08-06 NOTE — [facility] Group Notes (Signed)
Patient did not attend the goals group. 

## 2023-08-06 NOTE — Plan of Care (Signed)
  Problem: Education: Goal: Mental status will improve Outcome: Progressing   Problem: Activity: Goal: Interest or engagement in activities will improve Outcome: Progressing   Problem: Safety: Goal: Periods of time without injury will increase Outcome: Progressing

## 2023-08-06 NOTE — Group Note (Signed)
 Date:  08/06/2023 Time:  8:52 PM  Group Topic/Focus:  Wrap-Up Group:   The focus of this group is to help patients review their daily goal of treatment and discuss progress on daily workbooks.    Participation Level:  None  Participation Quality:   Patient was present, but did not participate   Affect:  Resistant  Cognitive:  Lacking  Insight: Lacking  Engagement in Group:  None  Modes of Intervention:  Discussion  Additional Comments:  Patient was present  but did not participate   Moishe Angel 08/06/2023, 8:52 PM

## 2023-08-06 NOTE — BH IP Treatment Plan (Signed)
 Interdisciplinary Treatment and Diagnostic Plan Update  08/06/2023 Time of Session: 11:45 AM - UPDATE Jerry Johnson MRN: [id number]  Principal Diagnosis: Bipolar 1 disorder (HCC)  Secondary Diagnoses: Principal Problem:   Bipolar 1 disorder (HCC), current episode manic Active Problems:   HIV disease (HCC)   Vitamin D  deficiency   Vitamin B12 deficiency   Current Medications:  Current Facility-Administered Medications  Medication Dose Route Frequency Provider Last Rate Last Admin   acetaminophen  (TYLENOL ) tablet 650 mg  650 mg Oral Q6H PRN Motley-Mangrum, Jadeka A, PMHNP       alum & mag hydroxide-simeth (MAALOX/MYLANTA) 200-200-20 MG/5ML suspension 30 mL  30 mL Oral Q4H PRN Motley-Mangrum, Jadeka A, PMHNP       bictegravir-emtricitabine -tenofovir  AF (BIKTARVY ) 50-200-25 MG per tablet 1 tablet  1 tablet Oral Daily Motley-Mangrum, Jadeka A, PMHNP   1 tablet at 08/06/23 0814   cyanocobalamin  (VITAMIN B12) tablet 1,000 mcg  1,000 mcg Oral Daily Parker, Alvin S, MD   1,000 mcg at 08/06/23 0815   haloperidol  (HALDOL ) tablet 5 mg  5 mg Oral TID PRN Motley-Mangrum, Jadeka A, PMHNP       And   diphenhydrAMINE  (BENADRYL ) capsule 50 mg  50 mg Oral TID PRN Motley-Mangrum, Jadeka A, PMHNP       haloperidol  lactate (HALDOL ) injection 5 mg  5 mg Intramuscular TID PRN Motley-Mangrum, Jadeka A, PMHNP       And   diphenhydrAMINE  (BENADRYL ) injection 50 mg  50 mg Intramuscular TID PRN Motley-Mangrum, Jadeka A, PMHNP       And   LORazepam  (ATIVAN ) injection 2 mg  2 mg Intramuscular TID PRN Motley-Mangrum, Jadeka A, PMHNP       haloperidol  lactate (HALDOL ) injection 10 mg  10 mg Intramuscular TID PRN Motley-Mangrum, Jadeka A, PMHNP       And   diphenhydrAMINE  (BENADRYL ) injection 50 mg  50 mg Intramuscular TID PRN Motley-Mangrum, Jadeka A, PMHNP       And   LORazepam  (ATIVAN ) injection 2 mg  2 mg Intramuscular TID PRN Motley-Mangrum, Jadeka A, PMHNP       divalproex  (DEPAKOTE ) DR tablet 1,000  mg  1,000 mg Oral BID Parker, Alvin S, MD   1,000 mg at 08/06/23 1133   magnesium  hydroxide (MILK OF MAGNESIA) suspension 30 mL  30 mL Oral Daily PRN Motley-Mangrum, Jadeka A, PMHNP       nicotine  (NICODERM CQ  - dosed in mg/24 hr) patch 7 mg  7 mg Transdermal Daily PRN McCarty, Artie, MD       risperiDONE  (RISPERDAL  M-TABS) disintegrating tablet 3 mg  3 mg Oral BID Parker, Alvin S, MD   3 mg at 08/06/23 1609   traZODone  (DESYREL ) tablet 50 mg  50 mg Oral QHS Parker, Alvin S, MD   50 mg at 08/05/23 2121   Vitamin D  (Ergocalciferol ) (DRISDOL ) 1.25 MG (50000 UNIT) capsule 50,000 Units  50,000 Units Oral Daily Timmothy Foots, MD   50,000 Units at 08/06/23 0815   PTA Medications: Medications Prior to Admission  Medication Sig Dispense Refill Last Dose/Taking   bictegravir-emtricitabine -tenofovir  AF (BIKTARVY ) 50-200-25 MG TABS tablet Take 1 tablet by mouth daily. NO FURTHER REFILLS UNTIL SEEN IN OFFICE 30 tablet 2     Patient Stressors: Legal issue    Patient Strengths: Average or above average intelligence  Communication skills  Physical Health  Supportive family/friends  Work skills   Treatment Modalities: Medication Management, Group therapy, Case management,  1 to 1 session with clinician, Psychoeducation, Recreational  therapy.   Physician Treatment Plan for Primary Diagnosis: Bipolar 1 disorder (HCC) Long Term Goal(s):     Short Term Goals:    Medication Management: Evaluate patient's response, side effects, and tolerance of medication regimen.  Therapeutic Interventions: 1 to 1 sessions, Unit Group sessions and Medication administration.  Evaluation of Outcomes: Progressing  Physician Treatment Plan for Secondary Diagnosis: Principal Problem:   Bipolar 1 disorder (HCC), current episode manic Active Problems:   HIV disease (HCC)   Vitamin D  deficiency   Vitamin B12 deficiency  Long Term Goal(s):     Short Term Goals:       Medication Management: Evaluate patient's  response, side effects, and tolerance of medication regimen.  Therapeutic Interventions: 1 to 1 sessions, Unit Group sessions and Medication administration.  Evaluation of Outcomes: Progressing   RN Treatment Plan for Primary Diagnosis: Bipolar 1 disorder (HCC) Long Term Goal(s): Knowledge of disease and therapeutic regimen to maintain health will improve  Short Term Goals: Ability to remain free from injury will improve, Ability to verbalize frustration and anger appropriately will improve, Ability to participate in decision making will improve, Ability to verbalize feelings will improve, Ability to identify and develop effective coping behaviors will improve, and Compliance with prescribed medications will improve  Medication Management: RN will administer medications as ordered by provider, will assess and evaluate patient's response and provide education to patient for prescribed medication. RN will report any adverse and/or side effects to prescribing provider.  Therapeutic Interventions: 1 on 1 counseling sessions, Psychoeducation, Medication administration, Evaluate responses to treatment, Monitor vital signs and CBGs as ordered, Perform/monitor CIWA, COWS, AIMS and Fall Risk screenings as ordered, Perform wound care treatments as ordered.  Evaluation of Outcomes: Progressing   LCSW Treatment Plan for Primary Diagnosis: Bipolar 1 disorder (HCC) Long Term Goal(s): Safe transition to appropriate next level of care at discharge, Engage patient in therapeutic group addressing interpersonal concerns.  Short Term Goals: Engage patient in aftercare planning with referrals and resources, Increase ability to appropriately verbalize feelings, Facilitate acceptance of mental health diagnosis and concerns, and Identify triggers associated with mental health/substance abuse issues  Therapeutic Interventions: Assess for all discharge needs, 1 to 1 time with Social worker, Explore available resources  and support systems, Assess for adequacy in community support network, Educate family and significant other(s) on suicide prevention, Complete Psychosocial Assessment, Interpersonal group therapy.  Evaluation of Outcomes: Progressing   Progress in Treatment: Attending groups: Yes. Participating in groups: Yes. Taking medication as prescribed: Yes. Toleration medication: Yes. Family/Significant other contact made:  Yes, contacted Daevin Swierk (mom) [phone] Patient understands diagnosis:  No  Discussing patient identified problems/goals with staff: No. Medical problems stabilized or resolved: Yes. Denies suicidal/homicidal ideation: Yes. Issues/concerns per patient self-inventory: No.   New problem(s) identified: No   New Short Term/Long Term Goal(s):     medication stabilization, elimination of SI thoughts, development of comprehensive mental wellness plan.      Patient Goals:  "It's unexpected that I'm here.  My mom should never have me hospitalized.  I was just drinking a little bit and felt tipsy.  She can't handle my emotions."   Discharge Plan or Barriers:  Patient recently admitted. CSW will continue to follow and assess for appropriate referrals and possible discharge planning.     Reason for Continuation of Hospitalization: Mania Medication stabilization Suicidal ideation   Estimated Length of Stay:  2 - 4 days  Last 3 [place] Suicide Severity Risk Score: Flowsheet Row Admission (Current)  [facility] from 07[facility]31/2023 in [facility] ED from 09/20/2020 in [facility] Department at [facility] [place]  C-SSRS RISK CATEGORY No Risk No Risk No Risk       Last PHQ 2/9 Scores:    06/10/2022    3:01 PM 02/23/2022    4:08 PM 10/02/2021    3:49 PM  Depression screen PHQ 2/9  Decreased Interest 0 0 0  Down, Depressed, Hopeless 0 0 1  PHQ - 2 Score 0 0 1     Scribe for Treatment Team: Aydden Cumpian O Marnette Perkins, LCSWA 08/06/2023 5:16 PM

## 2023-08-06 NOTE — Progress Notes (Signed)
 [facility] MD Progress Note  08/06/2023 11:19 AM Jerry Johnson  MRN:  [id number] Principal Problem: Bipolar 1 disorder (HCC) Diagnosis: Principal Problem:   Bipolar 1 disorder (HCC), current episode manic Active Problems:   HIV disease (HCC)   Vitamin D  deficiency   Vitamin B12 deficiency   ID & Admission Information: Jerry Johnson is an 33 y.o. male who  has a past medical history of Acute pharyngitis (06/07/2013), Adjustment disorder with mixed anxiety and depressed mood (10/30/2016), Allergic rhinitis (01/11/2008), ATTENTION DEFICIT, W/HYPERACTIVITY (06/03/2006), Bipolar 1 disorder (HCC), Generalized anxiety disorder (08/01/2023), Otitis externa of both ears (01/10/2014), Rash and nonspecific skin eruption (01/10/2014), Right groin pain (05/27/2012), Suicidal ideation (10/30/2016), and UNSPECIFIED HYDROCELE (03/12/2009).  He presented on 08/01/2023  2:42 PM for Bipolar 1 disorder (HCC).  He presented under petition for involuntary commitment taking out by his mother for jumping out of a moving vehicle while manic.  Subjective:   Case was discussed in the multidisciplinary team. MAR was reviewed and patient was compliant with medications.  No acute events occurred overnight.  Nursing notes that the patient was less intrusive yesterday.  PRN's in last 24 hours: None  The patient was seen in the interview room during rounds.  He remains focused on discharge, and was advised that the plan remains in place for discharge and Sunday if he continues to improve.  Patient is less hyperverbal today.  He is still loquacious, but his speech is not pressured as in previous days.  He tells me today that his girlfriend is pregnant, and that is why needs to be discharged prior to Sunday.  He continues to show somewhat poor insight and the need to take psychiatric medicines, and is at high risk for relapse back into a decompensated state of mania at this point.  As his Depakote  level is on the lower  end of therapeutic, we discussed increasing the dose today.  Will plan on switching from liquid to pills, and changing to twice daily dosing.  The patient expressed some concern about the number of pills that he is taking, so we will discontinue the multivitamin and thiamine .  The patient denies suicidal ideation, homicidal ideation, auditory hallucinations, or visual hallucinations.  He denies medication side effects.    Past Psychiatric and Medical Medical History:  Past Medical History:  Diagnosis Date   Acute pharyngitis 06/07/2013   Adjustment disorder with mixed anxiety and depressed mood 10/30/2016   Allergic rhinitis 01/11/2008   Replacing diagnoses that were inactivated after the 07/06/22 regulatory import     ATTENTION DEFICIT, W/HYPERACTIVITY 06/03/2006   Qualifier: Diagnosis of   By: Alen Amy      Replacing diagnoses that were inactivated after the 07/06/22 regulatory import     Bipolar 1 disorder (HCC)    Generalized anxiety disorder 08/01/2023   Otitis externa of both ears 01/10/2014   Rash and nonspecific skin eruption 01/10/2014   Right groin pain 05/27/2012   Suicidal ideation 10/30/2016   UNSPECIFIED HYDROCELE 03/12/2009   Qualifier: Diagnosis of  By: Jerone Moorman  MD, Sarina Curb      Past Surgical History:  Procedure Laterality Date   INGUINAL HERNIA REPAIR  03/21/09   Repair of R inguinal hernia with hydrocele    Family History(Medical and Psychiatric):  Family History  Problem Relation Age of Onset   Healthy Mother    Healthy Father    See H&P    Social History:  Social History   Substance and Sexual Activity  Alcohol Use  Not Currently   Comment: rarely     Social History   Substance and Sexual Activity  Drug Use Yes   Frequency: 4.0 times per week   Types: Marijuana   Comment: some days    Social History   Socioeconomic History   Marital status: Single    Spouse name: Not on file   Number of children: Not on file   Years of education: Not  on file   Highest education level: Not on file  Occupational History   Not on file  Tobacco Use   Smoking status: Some Days    Current packs/day: 0.25    Types: Cigarettes   Smokeless tobacco: Never   Tobacco comments:    Smokes a cigarette with breakfast and in the evening  Vaping Use   Vaping status: Never Used  Substance and Sexual Activity   Alcohol use: Not Currently    Comment: rarely   Drug use: Yes    Frequency: 4.0 times per week    Types: Marijuana    Comment: some days   Sexual activity: Yes    Birth control/protection: Condom    Comment: given condoms  Other Topics Concern   Not on file  Social History Narrative   Not on file   Social Drivers of Health   Financial Resource Strain: Not on file  Food Insecurity: Unknown (08/01/2023)   Hunger Vital Sign    Worried About Running Out of Food in the Last Year: Patient declined    Ran Out of Food in the Last Year: Not on file  Transportation Needs: Patient Declined (08/01/2023)   PRAPARE - Administrator, Civil Service (Medical): Patient declined    Lack of Transportation (Non-Medical): Patient declined  Physical Activity: Not on file  Stress: Not on file  Social Connections: Not on file        Current Medications: Current Facility-Administered Medications  Medication Dose Route Frequency Provider Last Rate Last Admin   acetaminophen  (TYLENOL ) tablet 650 mg  650 mg Oral Q6H PRN Motley-Mangrum, Jadeka A, PMHNP       alum & mag hydroxide-simeth (MAALOX/MYLANTA) 200-200-20 MG/5ML suspension 30 mL  30 mL Oral Q4H PRN Motley-Mangrum, Jadeka A, PMHNP       bictegravir-emtricitabine -tenofovir  AF (BIKTARVY ) 50-200-25 MG per tablet 1 tablet  1 tablet Oral Daily Motley-Mangrum, Jadeka A, PMHNP   1 tablet at 08/06/23 6045   cyanocobalamin  (VITAMIN B12) tablet 1,000 mcg  1,000 mcg Oral Daily Siriyah Ambrosius S, MD   1,000 mcg at 08/06/23 4098   haloperidol  (HALDOL ) tablet 5 mg  5 mg Oral TID PRN Motley-Mangrum,  Jadeka A, PMHNP       And   diphenhydrAMINE  (BENADRYL ) capsule 50 mg  50 mg Oral TID PRN Motley-Mangrum, Jadeka A, PMHNP       haloperidol  lactate (HALDOL ) injection 5 mg  5 mg Intramuscular TID PRN Motley-Mangrum, Jadeka A, PMHNP       And   diphenhydrAMINE  (BENADRYL ) injection 50 mg  50 mg Intramuscular TID PRN Motley-Mangrum, Jadeka A, PMHNP       And   LORazepam  (ATIVAN ) injection 2 mg  2 mg Intramuscular TID PRN Motley-Mangrum, Jadeka A, PMHNP       haloperidol  lactate (HALDOL ) injection 10 mg  10 mg Intramuscular TID PRN Motley-Mangrum, Jadeka A, PMHNP       And   diphenhydrAMINE  (BENADRYL ) injection 50 mg  50 mg Intramuscular TID PRN Motley-Mangrum, Jadeka A, PMHNP  And   LORazepam  (ATIVAN ) injection 2 mg  2 mg Intramuscular TID PRN Motley-Mangrum, Jadeka A, PMHNP       divalproex  (DEPAKOTE ) DR tablet 1,000 mg  1,000 mg Oral BID Cardell Rachel S, MD       magnesium  hydroxide (MILK OF MAGNESIA) suspension 30 mL  30 mL Oral Daily PRN Motley-Mangrum, Jadeka A, PMHNP       nicotine  (NICODERM CQ  - dosed in mg/24 hr) patch 7 mg  7 mg Transdermal Daily PRN McCarty, Artie, MD       risperiDONE  (RISPERDAL  M-TABS) disintegrating tablet 3 mg  3 mg Oral BID Jheremy Boger S, MD   3 mg at 08/06/23 4098   traZODone  (DESYREL ) tablet 50 mg  50 mg Oral QHS Aviya Jarvie S, MD   50 mg at 08/05/23 2121   Vitamin D  (Ergocalciferol ) (DRISDOL ) 1.25 MG (50000 UNIT) capsule 50,000 Units  50,000 Units Oral Daily Julieana Eshleman S, MD   50,000 Units at 08/06/23 0815    Lab Results:  Results for orders placed or performed during the hospital encounter of 08/01/23 (from the past 48 hours)  Valproic  acid level     Status: None   Collection Time: 08/06/23  6:28 AM  Result Value Ref Range   Valproic  Acid Lvl 60 50 - 100 ug/mL    Comment: Performed at [facility], [address] [place], [place] [address]     Blood Alcohol level:  Lab Results  Component Value Date   ETH 209 (H)  07/31/2023   ETH <10 10/06/2019    Metabolic Disorder Labs: Lab Results  Component Value Date   HGBA1C 4.9 08/03/2023   MPG 94 08/03/2023   No results found for: "PROLACTIN" Lab Results  Component Value Date   CHOL 163 08/03/2023   TRIG 70 08/03/2023   HDL 61 08/03/2023   CHOLHDL 2.7 08/03/2023   VLDL 14 08/03/2023   LDLCALC 88 08/03/2023   LDLCALC 94 09/23/2020    Physical Findings: AIMS:  , ,  ,  ,    CIWA:  CIWA-Ar Total: 0 COWS:     Psychiatric Specialty Exam:  Presentation  General Appearance: Bizarre; Other (comment) (Walking around the unit with a towel on his head)  Eye Contact: Fair  Speech: Pressured; Clear and Coherent  Speech Volume: Normal  Handedness: Right   Mood and Affect  Mood: Anxious; Dysphoric  Affect: Restricted   Thought Process  Thought Processes: Linear  Descriptions of Associations: Intact  Orientation: Full (Time, Place and Person)  Thought Content: Perseveration  History of Schizophrenia/Schizoaffective disorder: No data recorded Duration of Psychotic Symptoms: NA Hallucinations: Hallucinations: None   Ideas of Reference: None  Suicidal Thoughts: Suicidal Thoughts: No   Homicidal Thoughts: Homicidal Thoughts: No    Sensorium  Memory: Immediate Good; Recent Good  Judgment: Poor  Insight: Poor   Executive Functions  Concentration: Good  Attention Span: Good  Recall: Good  Fund of Knowledge: Good  Language: Good   Psychomotor Activity  Psychomotor Activity: Psychomotor Activity: Normal    Assets  Assets: Communication Skills; Social Support; Housing   Sleep  Sleep: Sleep: Fair    Musculoskeletal: Strength & Muscle Tone: within normal limits Gait & Station: normal Patient leans: N/A   Physical Exam: General: Sitting comfortably. NAD. HEENT: Normocephalic, atraumatic, MMM, EMOI Lungs: no increased work of breathing noted Heart: no cyanosis Abdomen: Non distended Musculoskeletal:  FROM. No obvious deformities Skin: Warm, dry, intact. No rashes noted Neuro: No obvious  focal deficits.  Gait and station are normal  Review of Systems  Constitutional: Negative.   HENT: Negative.    Eyes: Negative.   Respiratory: Negative.    Cardiovascular: Negative.   Gastrointestinal: Negative.   Genitourinary: Negative.   Skin: Negative.   Neurological: Negative.   Psychiatric/Behavioral:  Positive for hypomania.     Blood pressure 128/86, pulse 72, temperature (!) 97.5 F (36.4 C), temperature source Oral, resp. rate 12, height 5\' 11"  (1.803 m), weight 71.2 kg, SpO2 100%. Body mass index is 21.9 kg/m.  ASSESSMENT: Jerry Johnson is an 33 y.o. male who  has a past medical history of Acute pharyngitis (06/07/2013), Adjustment disorder with mixed anxiety and depressed mood (10/30/2016), Allergic rhinitis (01/11/2008), ATTENTION DEFICIT, W/HYPERACTIVITY (06/03/2006), Bipolar 1 disorder (HCC), Generalized anxiety disorder (08/01/2023), Otitis externa of both ears (01/10/2014), Rash and nonspecific skin eruption (01/10/2014), Right groin pain (05/27/2012), Suicidal ideation (10/30/2016), and UNSPECIFIED HYDROCELE (03/12/2009).  He presented on 08/01/2023  2:42 PM for Bipolar 1 disorder (HCC).  He presented IVC for jumping out of a moving vehicle while manic.  Diagnoses / Active Problems: Patient Active Problem List   Diagnosis Date Noted   Vitamin D  deficiency 08/06/2023   Vitamin B12 deficiency 08/06/2023   Bipolar 1 disorder (HCC), current episode manic 08/01/2023   HIV disease (HCC) 09/23/2020      PLAN: Safety and Monitoring:  -- Involuntary admission to inpatient psychiatric unit for safety, stabilization and treatment  -- Daily contact with patient to assess and evaluate symptoms and progress in treatment  -- Patient's case to be discussed in multi-disciplinary team meeting  -- Observation Level : q15 minute checks  -- Vital signs:  q12 hours  -- Precautions:  suicide, elopement, and assault  2. Psychiatric Diagnoses and Treatment:  Patient Active Problem List   Diagnosis Date Noted   Vitamin D  deficiency 08/06/2023   Vitamin B12 deficiency 08/06/2023   Bipolar 1 disorder (HCC), current episode manic 08/01/2023   HIV disease (HCC) 09/23/2020     Scheduled Medications:  bictegravir-emtricitabine -tenofovir  AF  1 tablet Oral Daily   vitamin B-12  1,000 mcg Oral Daily   divalproex   1,000 mg Oral BID   risperiDONE   3 mg Oral BID   traZODone   50 mg Oral QHS   Vitamin D  (Ergocalciferol )  50,000 Units Oral Daily    As Needed Medications: acetaminophen , alum & mag hydroxide-simeth, haloperidol  **AND** diphenhydrAMINE , haloperidol  lactate **AND** diphenhydrAMINE  **AND** LORazepam , haloperidol  lactate **AND** diphenhydrAMINE  **AND** LORazepam , magnesium  hydroxide, nicotine     3. Medical Issues Being Addressed:   -- HIV, vitamin D  deficiency, and vitamin B12 deficiency as above  Labs reviewed, unremarkable with the exception of: Alcohol 209 on admission, UDS THC positive, vitamin D  14.98, vitamin B12 140   Tobacco Use Disorder  --  Patient in need of nicotine  replacement; nicotine  polacrilex (gum) and nicotine  patch 21 mg / 24 hours ordered. Smoking cessation encouraged    4. Discharge Planning:   -- Social work and case management to assist with discharge planning and identification of hospital follow-up needs prior to discharge  -- Estimated LOS: Likely discharge on Sunday (5/4)  -- Discharge Concerns: Need to establish a safety plan; Medication compliance and effectiveness  -- Discharge Goals: Return home with outpatient referrals for mental health follow-up including medication management/psychotherapy  5. Short Term Goals:  Improve ability to identify changes in lifestyle to reduce recurrence of condition, verbalize feelings, disclose and discuss suicidal ideas, demonstrate self-control, identify and develop effective coping  behaviors,  compliance with prescribed medications, identify triggers associated with substance abuse/mental health issues, participate in unit milieu and in scheduled group therapies   6. Long Term Goals: Improvement in symptoms so the patient is ready for discharge   --The risks/benefits/side-effects/alternatives to the medications above were discussed in detail with the patient and time was given for questions. The patient provided informed consent.   -- Metabolic profile and EKG monitoring obtained while on an atypical antipsychotic and listed in the EHR    Total Time Spent in Direct Patient Care:  I personally spent 35 minutes on the unit in direct patient care. The direct patient care time included face-to-face time with the patient, reviewing the patient's chart, communicating with other professionals, and coordinating care. Greater than 50% of this time was spent in counseling or coordinating care with the patient regarding goals of hospitalization, psycho-education, and discharge planning needs.      Clair Crews, MD Psychiatrist  08/06/2023, 11:19 AM   I certify that inpatient services furnished can reasonably be expected to improve the patient's condition.    Portions of this note were created using voice recognition software. Minor syntax errors, grammatical content, spelling, or punctuation errors may have occurred unintentionally. Please notify the [facility] if the meaning of any statement is unclear.

## 2023-08-07 DIAGNOSIS — F319 Bipolar disorder, unspecified: Secondary | ICD-10-CM | POA: Diagnosis not present

## 2023-08-07 NOTE — Group Note (Unsigned)
 Date:  08/07/2023 Time:  11:57 PM  Group Topic/Focus:  Wrap-Up Group:   The focus of this group is to help patients review their daily goal of treatment and discuss progress on daily workbooks.    Participation Level:  Active  Participation Quality:  Appropriate and Attentive  Affect:  Appropriate  Cognitive:  Appropriate  Insight: Appropriate  Engagement in Group:  Engaged  Modes of Intervention:  Discussion  Additional Comments:  Patient stated that his day was a 10 out of 10 because he is going home.  Patient is still blaming mom for being here but stated he is working on himself.  Moishe Angel 08/07/2023, 11:57 PM

## 2023-08-07 NOTE — Progress Notes (Signed)
   08/07/23 1955  Psych Admission Type (Psych Patients Only)  Admission Status Involuntary  Psychosocial Assessment  Patient Complaints None  Eye Contact Fair  Facial Expression Animated  Affect Appropriate to circumstance  Speech Logical/coherent  Interaction Assertive  Motor Activity Other (Comment) (wnl)  Appearance/Hygiene In scrubs  Behavior Characteristics Cooperative;Appropriate to situation  Mood Pleasant  Thought Process  Coherency WDL  Content WDL  Delusions None reported or observed  Perception WDL  Hallucination None reported or observed  Judgment Poor  Confusion None  Danger to Self  Current suicidal ideation? Denies

## 2023-08-07 NOTE — Progress Notes (Signed)
   08/07/23 0900  Psych Admission Type (Psych Patients Only)  Admission Status Involuntary  Psychosocial Assessment  Patient Complaints None  Eye Contact Fair  Facial Expression Animated  Affect Appropriate to circumstance  Speech Logical/coherent  Interaction Assertive  Motor Activity Other (Comment) (WNL)  Appearance/Hygiene In scrubs  Behavior Characteristics Cooperative  Mood Pleasant  Thought Process  Coherency WDL  Content WDL  Delusions None reported or observed  Perception WDL  Hallucination None reported or observed  Judgment Poor  Confusion None  Danger to Self  Current suicidal ideation? Denies  Agreement Not to Harm Self Yes  Description of Agreement verbal  Danger to Others  Danger to Others None reported or observed

## 2023-08-07 NOTE — Group Note (Signed)
 Date:  08/07/2023 Time:  9:22 AM  Group Topic/Focus:  Goals Group:   The focus of this group is to help patients establish daily goals to achieve during treatment and discuss how the patient can incorporate goal setting into their daily lives to aide in recovery. Orientation:   The focus of this group is to educate the patient on the purpose and policies of crisis stabilization and provide a format to answer questions about their admission.  The group details unit policies and expectations of patients while admitted.    Participation Level:  Minimal  Participation Quality:  Appropriate  Affect:  Flat  Cognitive:  Lacking  Insight: Limited  Engagement in Group:  Lacking  Modes of Intervention:  Orientation  Additional Comments:    Violette Grief 08/07/2023, 9:22 AM

## 2023-08-07 NOTE — Plan of Care (Signed)
  Problem: Education: Goal: Knowledge of [place] [facility] Education information/materials will improve Outcome: Completed/Met Goal: Emotional status will improve Outcome: Adequate for Discharge Goal: Mental status will improve Outcome: Adequate for Discharge Goal: Verbalization of understanding the information provided will improve Outcome: Adequate for Discharge   Problem: Activity: Goal: Interest or engagement in activities will improve Outcome: Progressing Goal: Sleeping patterns will improve Outcome: Completed/Met   Problem: Coping: Goal: Ability to verbalize frustrations and anger appropriately will improve Outcome: Progressing
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
## 2023-08-07 NOTE — BHH Group Notes (Signed)
 LCSW Wellness Group Note   08/07/2023 13:30pm  Type of Group and Topic: Psychoeducational Group:  Wellness  Participation Level:  minimal  Description of Group  Wellness group introduces the topic and its focus on developing healthy habits across the spectrum and its relationship to a decrease in hospital admissions.  Six areas of wellness are discussed: physical, social spiritual, intellectual, occupational, and emotional.  Patients are asked to consider their current wellness habits and to identify areas of wellness where they are interested and able to focus on improvements.    Therapeutic Goals Patients will understand components of wellness and how they can positively impact overall health.  Patients will identify areas of wellness where they have developed good habits. Patients will identify areas of wellness where they would like to make improvements.    Summary of Patient Progress: pt attentive during group but did not participate in group discussion.  When called on by CSW, pt identified spiritual wellness as an area of strength and emotional and physical as wellness areas that need work.       Therapeutic Modalities: Cognitive Behavioral Therapy Psychoeducation    Elspeth Hals, LCSW

## 2023-08-07 NOTE — Progress Notes (Signed)
   08/07/23 0512  15 Minute Checks  Location Bedroom  Visual Appearance Calm  Behavior Sleeping  Sleep (Behavioral Health Patients Only)  Calculate sleep? (Click Yes once per 24 hr at 0600 safety check) Yes  Documented sleep last 24 hours 8

## 2023-08-08 DIAGNOSIS — F319 Bipolar disorder, unspecified: Secondary | ICD-10-CM | POA: Diagnosis not present

## 2023-08-08 MED ORDER — RISPERIDONE 3 MG PO TABS: 3.0000 mg | ORAL_TABLET | Freq: Two times a day (BID) | ORAL | 0 refills | Status: AC

## 2023-08-08 MED ORDER — TRAZODONE HCL 50 MG PO TABS: 50.0000 mg | ORAL_TABLET | Freq: Every day | ORAL | 0 refills | Status: AC

## 2023-08-08 MED ORDER — CYANOCOBALAMIN 1000 MCG PO TABS: 1000.0000 ug | ORAL_TABLET | Freq: Every day | ORAL | 0 refills | Status: AC

## 2023-08-08 MED ORDER — CHOLECALCIFEROL 125 MCG (5000 UT) PO TABS: 1.0000 | ORAL_TABLET | Freq: Every day | ORAL | 0 refills | Status: AC

## 2023-08-08 MED ORDER — DIVALPROEX SODIUM 500 MG PO DR TAB
1000.0000 mg | DELAYED_RELEASE_TABLET | Freq: Two times a day (BID) | ORAL | 0 refills | Status: AC
Start: 2023-08-08 — End: ?

## 2023-08-08 MED ORDER — VITAMIN D (ERGOCALCIFEROL) 1.25 MG (50000 UNIT) PO CAPS: 50000.0000 [IU] | ORAL_CAPSULE | ORAL | 0 refills | Status: AC

## 2023-08-08 NOTE — Group Note (Signed)
 Date:  08/08/2023 Time:  10:16 AM  Group Topic/Focus:  Goals Group:   The focus of this group is to help patients establish daily goals to achieve during treatment and discuss how the patient can incorporate goal setting into their daily lives to aide in recovery. Orientation:   The focus of this group is to educate the patient on the purpose and policies of crisis stabilization and provide a format to answer questions about their admission.  The group details unit policies and expectations of patients while admitted.    Participation Level:  Active  Participation Quality:  Appropriate  Affect:  Appropriate  Cognitive:  Appropriate  Insight: Appropriate  Engagement in Group:  Engaged  Modes of Intervention:  Discussion  Additional Comments:    Ledon Weihe D [place] [address] 08/08/2023, 10:16 AM

## 2023-08-08 NOTE — Progress Notes (Signed)
   08/08/23 0745  Psych Admission Type (Psych Patients Only)  Admission Status Involuntary  Psychosocial Assessment  Patient Complaints None  Eye Contact Fair  Facial Expression Animated  Affect Appropriate to circumstance  Speech Logical/coherent  Interaction Assertive  Motor Activity Other (Comment) (WNL)  Appearance/Hygiene In scrubs  Behavior Characteristics Appropriate to situation;Cooperative  Mood Pleasant  Thought Process  Coherency WDL  Content WDL  Delusions None reported or observed  Perception WDL  Hallucination None reported or observed  Judgment WDL  Confusion None  Danger to Self  Current suicidal ideation? Denies  Agreement Not to Harm Self Yes  Description of Agreement verbal  Danger to Others  Danger to Others None reported or observed
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
## 2023-08-08 NOTE — Plan of Care (Signed)
  Problem: Education: Goal: Emotional status will improve 08/08/2023 1121 by Media Spikes, RN Outcome: Completed/Met 08/08/2023 0935 by Media Spikes, RN Outcome: Progressing Goal: Mental status will improve 08/08/2023 1121 by Media Spikes, RN Outcome: Completed/Met 08/08/2023 0935 by Media Spikes, RN Outcome: Progressing Goal: Verbalization of understanding the information provided will improve 08/08/2023 1121 by Media Spikes, RN Outcome: Completed/Met 08/08/2023 0935 by Media Spikes, RN Outcome: Progressing   Problem: Coping: Goal: Ability to verbalize frustrations and anger appropriately will improve Outcome: Completed/Met Goal: Ability to demonstrate self-control will improve Outcome: Completed/Met
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
## 2023-08-08 NOTE — Progress Notes (Signed)
  [facility] Adult Case Management Discharge Plan :  Will you be returning to the same living situation after discharge:  Yes,  the patient will be going back home At discharge, do you have transportation home?: Yes,  The patient's mom will be picking the patient up Do you have the ability to pay for your medications: Yes,  The patient stated that he has insurance  Release of information consent forms completed and in the chart;  Patient's signature needed at discharge.  Patient to Follow up at:  Follow-up Information     [facility] [place]. [facility] Follow up on 09/16/2023.   Specialty: Infectious Diseases Why: This provider only refills infectious disease medications.  They are referring you to a primary care physician for other medication management services..  You also have an appointment on 09/16/23 at 9:45 am.  You do have refills for your current infectious disease medications at your pharmacy. Contact information: [address] [place], [address] [place]  [address] [phone]        [facility] [facility] Follow up on 08/10/2023.   Specialty: Professional Counselor Why: Please go to this provider on 08/10/23 at 9:00 am for an assessment, to obtain an appointment for therapy services.  You may also go on Monday through Friday, from 9 am to 1 pm. Contact information: [address]  [address] [place] [address] [phone]         [facility]. Go on 08/11/2023.   Specialty: Behavioral Health Why: Please go to this provider on 08/11/23 at 7:00 am for an assessment to obtain medication management services.  You may also go on Monday through Friday, arrive by 7:00 am. Contact information: [address] [place]  [address] [phone]                Next level of care provider has access to [facility] Link:no  Safety Planning and Suicide Prevention discussed: Yes,   CSW called and spoke to the patient's mom 08/03/2023     Has patient been referred to the Quitline?: Patient refused referral for treatment  Patient has been referred for addiction treatment: Patient refused referral for treatment; referral information given to patient at discharge.  Wren Pryce O Karyss Frese, LCSWA 08/08/2023, 10:04 AM

## 2023-08-08 NOTE — Progress Notes (Signed)
 Patient ID: Jerry Johnson, male   DOB: 1990/08/28, 33 y.o.   MRN: [id number] Discharge instructions, follow up appointments and medications reviewed, patient verbalized understanding. Patients belongings returned, patient discharged home.

## 2023-08-08 NOTE — Plan of Care (Signed)
   Problem: Education: Goal: Emotional status will improve Outcome: Progressing Goal: Mental status will improve Outcome: Progressing Goal: Verbalization of understanding the information provided will improve Outcome: Progressing   Problem: Activity: Goal: Interest or engagement in activities will improve Outcome: Progressing

## 2023-08-08 NOTE — Plan of Care (Signed)
  Problem: Education: Goal: Emotional status will improve Outcome: Progressing   Problem: Activity: Goal: Interest or engagement in activities will improve Outcome: Progressing   Problem: Education: Goal: Emotional status will improve Outcome: Progressing

## 2023-09-16 ENCOUNTER — Ambulatory Visit: Admitting: Family

## 2023-11-23 ENCOUNTER — Other Ambulatory Visit: Payer: Self-pay | Admitting: Pharmacist

## 2023-11-23 DIAGNOSIS — B20 Human immunodeficiency virus [HIV] disease: Secondary | ICD-10-CM

## 2023-11-25 ENCOUNTER — Ambulatory Visit: Admitting: Family

## 2023-11-27 NOTE — Progress Notes (Unsigned)
   HPI: Jerry Johnson is a 33 y.o. male who presents to the [facility] pharmacy clinic for HIV follow-up.  Patient Active Problem List   Diagnosis Date Noted   Vitamin D  deficiency 08/06/2023   Vitamin B12 deficiency 08/06/2023   Bipolar 1 disorder (HCC), current episode manic 08/01/2023   HIV disease (HCC) 09/23/2020    Patient's Medications  New Prescriptions   No medications on file  Previous Medications   BIKTARVY  50-200-25 MG TABS TABLET    TAKE 1 TABLET BY MOUTH DAILY   CHOLECALCIFEROL  125 MCG (5000 UT) TABS    Take 1 tablet (5,000 Units total) by mouth daily.   CYANOCOBALAMIN  1000 MCG TABLET    Take 1 tablet (1,000 mcg total) by mouth daily.   DIVALPROEX  (DEPAKOTE ) 500 MG DR TABLET    Take 2 tablets (1,000 mg total) by mouth 2 (two) times daily.   RISPERIDONE  (RISPERDAL ) 3 MG TABLET    Take 1 tablet (3 mg total) by mouth 2 (two) times daily.   TRAZODONE  (DESYREL ) 50 MG TABLET    Take 1 tablet (50 mg total) by mouth at bedtime.   VITAMIN D , ERGOCALCIFEROL , (DRISDOL ) 1.25 MG (50000 UNIT) CAPS CAPSULE    Take 1 capsule (50,000 Units total) by mouth every 7 (seven) days.  Modified Medications   No medications on file  Discontinued Medications   No medications on file    Labs: Lab Results  Component Value Date   HIV1RNAQUANT NOT DETECTED 07/06/2023   HIV1RNAQUANT Not Detected 06/10/2022   HIV1RNAQUANT <20 (H) 02/23/2022   CD4TABS 1,070 06/10/2022   CD4TABS 670 02/23/2022   CD4TABS 630 10/02/2021    RPR and STI Lab Results  Component Value Date   LABRPR NON-REACTIVE 07/06/2023   LABRPR NON-REACTIVE 06/10/2022   LABRPR NON-REACTIVE 02/23/2022   LABRPR NON-REACTIVE 10/02/2021   LABRPR NON-REACTIVE 04/29/2021    STI Results GC CT  07/06/2023  1:35 PM Negative    Negative    Negative  Negative    Negative    Negative   06/10/2022  3:14 PM Negative    Negative    Negative  Negative    Negative    Positive   02/23/2022  4:29 PM Negative  Positive    02/23/2022  4:28 PM Negative  Negative   09/23/2020  4:39 PM Negative  Negative   06/22/2020  2:39 AM Negative  Negative   02/12/2015 12:00 AM Negative  **POSITIVE**     Hepatitis B Lab Results  Component Value Date   HEPBSAB NON-REACTIVE 02/23/2022   HEPBSAG NON-REACTIVE 02/23/2022   HEPBCAB NON-REACTIVE 02/23/2022   Hepatitis C Lab Results  Component Value Date   HEPCAB NON-REACTIVE 07/06/2023   Hepatitis A Lab Results  Component Value Date   HAV NON-REACTIVE 02/23/2022   Lipids: Lab Results  Component Value Date   CHOL 163 08/03/2023   TRIG 70 08/03/2023   HDL 61 08/03/2023   CHOLHDL 2.7 08/03/2023   VLDL 14 08/03/2023   LDLCALC 88 08/03/2023    Current HIV Regimen: Biktarvy  PO daily   Assessment: ***  Plan: - Check HIV RNA - STI testing  Elma Fail, PharmD PGY1 Clinical Pharmacist [facility]  11/27/2023 9:18 PM

## 2023-11-29 ENCOUNTER — Ambulatory Visit: Admitting: Pharmacist

## 2023-11-29 DIAGNOSIS — B20 Human immunodeficiency virus [HIV] disease: Secondary | ICD-10-CM

## 2023-11-29 DIAGNOSIS — Z113 Encounter for screening for infections with a predominantly sexual mode of transmission: Secondary | ICD-10-CM

## 2024-01-11 ENCOUNTER — Other Ambulatory Visit: Payer: Self-pay | Admitting: Family

## 2024-01-11 DIAGNOSIS — B20 Human immunodeficiency virus [HIV] disease: Secondary | ICD-10-CM

## 2024-02-01 ENCOUNTER — Ambulatory Visit: Admitting: Family

## 2024-02-07 ENCOUNTER — Other Ambulatory Visit: Payer: Self-pay | Admitting: Family

## 2024-02-07 DIAGNOSIS — B20 Human immunodeficiency virus [HIV] disease: Secondary | ICD-10-CM

## 2024-02-08 NOTE — Telephone Encounter (Signed)
 Patient has canceled/no-showed last 4 appointments.   Ashlin Hidalgo, BSN, RN

## 2024-02-22 ENCOUNTER — Other Ambulatory Visit

## 2024-02-22 ENCOUNTER — Other Ambulatory Visit: Payer: Self-pay

## 2024-02-22 ENCOUNTER — Telehealth: Payer: Self-pay

## 2024-02-22 DIAGNOSIS — Z113 Encounter for screening for infections with a predominantly sexual mode of transmission: Secondary | ICD-10-CM

## 2024-02-22 DIAGNOSIS — B20 Human immunodeficiency virus [HIV] disease: Secondary | ICD-10-CM

## 2024-02-22 MED ORDER — BIKTARVY 50-200-25 MG PO TABS: 1.0000 | ORAL_TABLET | Freq: Every day | ORAL | 0 refills | Status: DC

## 2024-02-22 NOTE — Addendum Note (Signed)
 Addended by: GRETEL TULLY HERO on: 02/22/2024 09:15 AM   Modules accepted: Orders

## 2024-02-22 NOTE — Telephone Encounter (Signed)
 error

## 2024-02-23 LAB — C. TRACHOMATIS/N. GONORRHOEAE RNA
C. trachomatis RNA, TMA: NOT DETECTED
N. gonorrhoeae RNA, TMA: NOT DETECTED

## 2024-02-24 LAB — CBC WITH DIFFERENTIAL/PLATELET
Absolute Lymphocytes: 2161 {cells}/uL (ref 850–3900)
Absolute Monocytes: 593 {cells}/uL (ref 200–950)
Basophils Absolute: 39 {cells}/uL (ref 0–200)
Basophils Relative: 0.5 %
Eosinophils Absolute: 320 {cells}/uL (ref 15–500)
Eosinophils Relative: 4.1 %
HCT: 41.7 % (ref 38.5–50.0)
Hemoglobin: 13.3 g/dL (ref 13.2–17.1)
MCH: 26.2 pg — ABNORMAL LOW (ref 27.0–33.0)
MCHC: 31.9 g/dL — ABNORMAL LOW (ref 32.0–36.0)
MCV: 82.1 fL (ref 80.0–100.0)
MPV: 12 fL (ref 7.5–12.5)
Monocytes Relative: 7.6 %
Neutro Abs: 4688 {cells}/uL (ref 1500–7800)
Neutrophils Relative %: 60.1 %
Platelets: 190 Thousand/uL (ref 140–400)
RBC: 5.08 Million/uL (ref 4.20–5.80)
RDW: 14.5 % (ref 11.0–15.0)
Total Lymphocyte: 27.7 %
WBC: 7.8 Thousand/uL (ref 3.8–10.8)

## 2024-02-24 LAB — COMPLETE METABOLIC PANEL WITHOUT GFR
AG Ratio: 1.6 (calc) (ref 1.0–2.5)
ALT: 10 U/L (ref 9–46)
AST: 17 U/L (ref 10–40)
Albumin: 4.6 g/dL (ref 3.6–5.1)
Alkaline phosphatase (APISO): 75 U/L (ref 36–130)
BUN: 13 mg/dL (ref 7–25)
CO2: 27 mmol/L (ref 20–32)
Calcium: 9.9 mg/dL (ref 8.6–10.3)
Chloride: 104 mmol/L (ref 98–110)
Creat: 0.93 mg/dL (ref 0.60–1.26)
Globulin: 2.9 g/dL (ref 1.9–3.7)
Glucose, Bld: 116 mg/dL — ABNORMAL HIGH (ref 65–99)
Potassium: 4.1 mmol/L (ref 3.5–5.3)
Sodium: 140 mmol/L (ref 135–146)
Total Bilirubin: 0.8 mg/dL (ref 0.2–1.2)
Total Protein: 7.5 g/dL (ref 6.1–8.1)

## 2024-02-24 LAB — T-HELPER CELLS (CD4) COUNT (NOT AT ARMC)
Absolute CD4: 923 {cells}/uL (ref 490–1740)
CD4 T Helper %: 41 % (ref 30–61)
Total lymphocyte count: 2262 {cells}/uL (ref 850–3900)

## 2024-02-24 LAB — HIV-1 RNA QUANT-NO REFLEX-BLD
HIV 1 RNA Quant: NOT DETECTED {copies}/mL
HIV-1 RNA Quant, Log: NOT DETECTED {Log_copies}/mL

## 2024-02-24 LAB — RPR: RPR Ser Ql: NONREACTIVE

## 2024-02-27 NOTE — Progress Notes (Unsigned)
 HPI: Jerry Johnson is a 33 y.o. male who presents to the [facility] pharmacy clinic for HIV follow-up.  Referring ID Provider: Cathlyn July, NP   Patient Active Problem List   Diagnosis Date Noted   Vitamin D  deficiency 08/06/2023   Vitamin B12 deficiency 08/06/2023   Bipolar 1 disorder (HCC), current episode manic 08/01/2023   HIV disease (HCC) 09/23/2020    Patient's Medications  New Prescriptions   No medications on file  Previous Medications   BICTEGRAVIR-EMTRICITABINE -TENOFOVIR  AF (BIKTARVY ) 50-200-25 MG TABS TABLET    Take 1 tablet by mouth daily.   CHOLECALCIFEROL  125 MCG (5000 UT) TABS    Take 1 tablet (5,000 Units total) by mouth daily.   CYANOCOBALAMIN  1000 MCG TABLET    Take 1 tablet (1,000 mcg total) by mouth daily.   DIVALPROEX  (DEPAKOTE ) 500 MG DR TABLET    Take 2 tablets (1,000 mg total) by mouth 2 (two) times daily.   RISPERIDONE  (RISPERDAL ) 3 MG TABLET    Take 1 tablet (3 mg total) by mouth 2 (two) times daily.   TRAZODONE  (DESYREL ) 50 MG TABLET    Take 1 tablet (50 mg total) by mouth at bedtime.   VITAMIN D , ERGOCALCIFEROL , (DRISDOL ) 1.25 MG (50000 UNIT) CAPS CAPSULE    Take 1 capsule (50,000 Units total) by mouth every 7 (seven) days.  Modified Medications   No medications on file  Discontinued Medications   No medications on file    Labs: Lab Results  Component Value Date   HIV1RNAQUANT NOT DETECTED 02/22/2024   HIV1RNAQUANT NOT DETECTED 07/06/2023   HIV1RNAQUANT Not Detected 06/10/2022   CD4TABS 1,070 06/10/2022   CD4TABS 670 02/23/2022   CD4TABS 630 10/02/2021    RPR and STI Lab Results  Component Value Date   LABRPR NON-REACTIVE 02/22/2024   LABRPR NON-REACTIVE 07/06/2023   LABRPR NON-REACTIVE 06/10/2022   LABRPR NON-REACTIVE 02/23/2022   LABRPR NON-REACTIVE 10/02/2021    STI Results GC CT  07/06/2023  1:35 PM Negative    Negative    Negative  Negative    Negative    Negative   06/10/2022  3:14 PM Negative    Negative    Negative   Negative    Negative    Positive   02/23/2022  4:29 PM Negative  Positive   02/23/2022  4:28 PM Negative  Negative   09/23/2020  4:39 PM Negative  Negative   06/22/2020  2:39 AM Negative  Negative   02/12/2015 12:00 AM Negative  **POSITIVE**     Hepatitis B Lab Results  Component Value Date   HEPBSAB NON-REACTIVE 02/23/2022   HEPBSAG NON-REACTIVE 02/23/2022   HEPBCAB NON-REACTIVE 02/23/2022   Hepatitis C Lab Results  Component Value Date   HEPCAB NON-REACTIVE 07/06/2023   Hepatitis A Lab Results  Component Value Date   HAV NON-REACTIVE 02/23/2022   Lipids: Lab Results  Component Value Date   CHOL 163 08/03/2023   TRIG 70 08/03/2023   HDL 61 08/03/2023   CHOLHDL 2.7 08/03/2023   VLDL 14 08/03/2023   LDLCALC 88 08/03/2023    Current HIV Regimen: Biktarvy    Assessment: Jerry Johnson presents today for follow up for HIV management after multiple missed appointments. He follows with Cathlyn for HIV management and was last seen for an office visit on 07/06/2023 with pharmacy. Recent labs collected on 02/22/2024 resulted in an undetectable HIV RNA level and a CD4 count of 923.   Today, Jerry Johnson reports doing well on Biktarvy  without any side effects and  reports no missed doses. He reports no new partners or known STI exposures and politely declines STI testing today. Patient reports no barriers to attending office appointments but does state that he is busy with work and sometimes travels out of town.   Labs:  - Patient politely declines STI testing today   Eligible vaccinations: Influenza, Covid, Havrix 1/2, Heplisav 1/2, PCV20, Menveo 1/2, Shingrix 1/2, HPV 1/3  - Patient politely declines vaccines today   Plan: - Continue Biktarvy  and refills have been sent to preferred pharmacy - Follow up on 08/14/2024 with Cathlyn - Reach out with any questions or concerns  Feliciano Close, PharmD PGY2 Infectious Diseases Pharmacy Resident

## 2024-02-28 ENCOUNTER — Ambulatory Visit (INDEPENDENT_AMBULATORY_CARE_PROVIDER_SITE_OTHER): Admitting: Pharmacist

## 2024-02-28 ENCOUNTER — Other Ambulatory Visit: Payer: Self-pay

## 2024-02-28 DIAGNOSIS — B20 Human immunodeficiency virus [HIV] disease: Secondary | ICD-10-CM

## 2024-02-28 DIAGNOSIS — Z113 Encounter for screening for infections with a predominantly sexual mode of transmission: Secondary | ICD-10-CM

## 2024-02-28 MED ORDER — BIKTARVY 50-200-25 MG PO TABS: 1.0000 | ORAL_TABLET | Freq: Every day | ORAL | 5 refills | Status: AC

## 2024-08-14 ENCOUNTER — Ambulatory Visit: Admitting: Family
